# Patient Record
Sex: Male | Born: 2000 | Race: White | Hispanic: No | Marital: Single | Smoking: Current every day smoker
Health system: Southern US, Community
[De-identification: ages and names within clinical notes are randomized; demographics above are authoritative.]

## PROBLEM LIST (undated history)

## (undated) VITALS — BP 110/64 | HR 66 | Temp 97.9°F | Resp 18 | Ht 68.0 in | Wt 125.0 lb

## (undated) VITALS — BP 107/71 | HR 125 | Temp 97.9°F | Resp 18 | Wt 125.0 lb

## (undated) DIAGNOSIS — F19159 Other psychoactive substance abuse with psychoactive substance-induced psychotic disorder, unspecified: Secondary | ICD-10-CM

## (undated) DIAGNOSIS — F333 Major depressive disorder, recurrent, severe with psychotic symptoms: Secondary | ICD-10-CM

## (undated) DIAGNOSIS — F29 Unspecified psychosis not due to a substance or known physiological condition: Secondary | ICD-10-CM

## (undated) DIAGNOSIS — F250 Schizoaffective disorder, bipolar type: Secondary | ICD-10-CM

## (undated) DIAGNOSIS — F419 Anxiety disorder, unspecified: Secondary | ICD-10-CM

## (undated) DIAGNOSIS — T1491XA Suicide attempt, initial encounter: Secondary | ICD-10-CM

## (undated) DIAGNOSIS — R519 Headache, unspecified: Secondary | ICD-10-CM

## (undated) DIAGNOSIS — R51 Headache: Secondary | ICD-10-CM

---
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
## 2019-08-04 ENCOUNTER — Observation Stay (HOSPITAL_COMMUNITY)
Admission: RE | Admit: 2019-08-04 | Discharge: 2019-08-05 | Disposition: A | Discharge status: Home / Self Care | Payer: Medicaid Other | Attending: Psychiatry | Admitting: Psychiatry

## 2019-08-04 DIAGNOSIS — F17200 Nicotine dependence, unspecified, uncomplicated: Secondary | ICD-10-CM | POA: Insufficient documentation

## 2019-08-04 DIAGNOSIS — Z880 Allergy status to penicillin: Secondary | ICD-10-CM | POA: Diagnosis not present

## 2019-08-04 DIAGNOSIS — Z888 Allergy status to other drugs, medicaments and biological substances status: Secondary | ICD-10-CM | POA: Diagnosis not present

## 2019-08-04 DIAGNOSIS — Z20822 Contact with and (suspected) exposure to covid-19: Secondary | ICD-10-CM | POA: Diagnosis not present

## 2019-08-04 DIAGNOSIS — Z915 Personal history of self-harm: Secondary | ICD-10-CM | POA: Insufficient documentation

## 2019-08-04 DIAGNOSIS — Z882 Allergy status to sulfonamides status: Secondary | ICD-10-CM | POA: Insufficient documentation

## 2019-08-04 DIAGNOSIS — F332 Major depressive disorder, recurrent severe without psychotic features: Secondary | ICD-10-CM | POA: Diagnosis not present

## 2019-08-04 DIAGNOSIS — Z881 Allergy status to other antibiotic agents status: Secondary | ICD-10-CM | POA: Insufficient documentation

## 2019-08-04 LAB — RESPIRATORY PANEL BY RT PCR (FLU A&B, COVID)
Influenza A by PCR: NEGATIVE
Influenza B by PCR: NEGATIVE
SARS Coronavirus 2 by RT PCR: NEGATIVE

## 2019-08-04 NOTE — BH Assessment (Addendum)
Assessment Note  Billy Rose is an 19 y.o. male with history of MDD, recurrent, severe, with psychosis, psychoactive substance with psychotic disorder, and psychosis. Patient states that he was brought to [facility] by his mother. He then reported that he drove himself. His mode of transportation to [facility] is unclear. He presented to [facility] as a walk-in. He is voluntary. Patient has several stressors. He reports no sleeping more than 6 hrs in the past 7 days. He also has not eaten well in the past month. He has only eaten a slice of pizza and a box of nerds in the past 7 days. States that he quit his job after one day of working. He was unable to provide a rationale for quitting his job. He is in the process of getting evicted from his residence. Also, recent arguments with his girlfriend.   Patient denies suicidal ideations. No plan or intent. He has a history of self mutilating behaviors (superficial cutting). He last cut himself 3 months. States, "I promised my girlfriend that I wouldn't cut anymore". Patient with symptoms of depression. States that he isolating self from others, experiencing loss of interest in usual pleasures, and crying spells. No HI. No history of aggressive behaviors. Calm and cooperative during the assessment. He reports auditory hallucinations of mumbling sounds. He is experiencing tactile hallucinations of spiders crawling on him. He has visual hallucinations of "light streaks". Patient reports alcohol and thc use only. However is history notes usage of cocaine, LSD, and heroin.   Patient was oriented to person and place. He was minimally oriented to time. He did not know the date stating it was "March 20 something". He also did not know the day of the week. His understanding of the situation was also questionable as patient presented confused. He displayed thought blocking. Speech was delayed at times. He appeared distracted during the assessment. His mood was depressed. Judgement and insight  were both poor.    Diagnosis: MDD, recurrent, severe, with psychosis,  Past Medical History:  Past Medical History:  Diagnosis Date  . Headache   . MDD (major depressive disorder), recurrent, severe, with psychosis (HCC)   . 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 Psychosis (HCC)     No past surgical history on file.  Family History: No family history on file.  Social History:  reports that he has been smoking. He has never used smokeless tobacco. He reports current alcohol use. He reports current drug use. Drugs: Cocaine, LSD, and Marijuana.  Additional Social History:  Alcohol / Drug Use Pain Medications: see MAR Prescriptions: see MAR Over the Counter: see MAR History of alcohol / drug use?: Yes Substance #1 Name of Substance 1: Alcohol 1 - Age of First Use: teens 1 - Amount (size/oz): "2 Mickies" 1 - Frequency: daily 1 - Duration: on-going 1 - Last Use / Amount: unable to determine Substance #2 Name of Substance 2: LSD per history; patient denied substance use outside of thc and alcohol" 2 - Age of First Use: unk 2 - Amount (size/oz): unk 2 - Frequency: unk, UTA 2 - Duration: unk, UTA 2 - Last Use / Amount: unk, UTA Substance #3 Name of Substance 3: Heroin per history; patient denied substance  use outside of thc and alcohol" 3 - Age of First Use: unk, UTA 3 - Amount (size/oz): unk, UTA 3 - Frequency: unk, UTA 3 - Duration: unk, UTA 3 - Last Use / Amount: unk Substance #4 Name of  Substance 4: Cocaine per per history; patient denied substance use outside of thc and alcohol" 4 - Age of First Use: unk, UTA 4 - Amount (size/oz): unk, UTA 4 - Frequency: unk, UTA 4 - Duration: unk, UTA 4 - Last Use / Amount: unk, UTA Substance #5 Name of Substance 5: THC 5 - Age of First Use: teens 5 - Amount (size/oz): 2 grams 5 - Frequency: 2x's per week 5 - Duration: on-going 5 - Last Use / Amount: unk, UTA  CIWA: CIWA-Ar BP: (!)  140/91(Nurse was notified Manufacturing systems engineer) Pulse Rate: (!) 114(Nurse was notified Manufacturing systems engineer) COWS:    Allergies:  Allergies  Allergen Reactions  . Sulfa Antibiotics Anaphylaxis, Shortness Of Breath, Swelling and Rash    Throat swells  . Penicillins Other (See Comments)    Reaction not recalled, but he IS allergic Did it involve swelling of the face/tongue/throat, SOB, or low BP? Unk Did it involve sudden or severe rash/hives, skin peeling, or any reaction on the inside of your mouth or nose? Unk Did you need to seek medical attention at a hospital or doctor's office? Unk When did it last happen? Childhood If all above answers are "NO", may proceed with cephalosporin use.   . Prozac [Fluoxetine Hcl] Other (See Comments)    Makes mean  . Bactrim [Sulfamethoxazole-Trimethoprim] Rash    Home Medications:  Medications Prior to Admission  Medication Sig Dispense Refill  . OLANZapine zydis (ZYPREXA) 10 MG disintegrating tablet Take 1 tablet (10 mg total) by mouth every morning. 30 tablet 0  . OLANZapine zydis (ZYPREXA) 15 MG disintegrating tablet Take 1 tablet (15 mg total) by mouth at bedtime. 30 tablet 0    OB/GYN Status:  No LMP for male patient.  General Assessment Data Location of Assessment: [facility] Assessment Services TTS Assessment: In system Is this a Tele or Face-to-Face Assessment?: Face-to-Face Is this an Initial Assessment or a Re-assessment for this encounter?: Initial Assessment Patient Accompanied by:: (self referral) Language Other than English: No Living Arrangements: Other (Comment)(alone) What gender do you identify as?: Male Marital status: Single Maiden name: (n/a) Pregnancy Status: No Living Arrangements: Alone Can pt return to current living arrangement?: Yes Admission Status: Voluntary Is patient capable of signing voluntary admission?: Yes Referral Source: Self/Family/Friend Insurance type: (Medicaid)  Medical Screening Exam [facility] Walk-in ONLY) Medical  Exam completed: Billy Kilts, NP)  Crisis Care Plan Living Arrangements: Alone Legal Guardian: Other: Name of Psychiatrist: (Dr. Kathryne Sharper; last seen 1 yr ago) Name of Therapist: (No therapist)  Education Status Is patient currently in school?: No Is the patient employed, unemployed or receiving disability?: Unemployed(quit job today)  Risk to self with the past 6 months Suicidal Ideation: No Has patient been a risk to self within the past 6 months prior to admission? : No Suicidal Intent: No Has patient had any suicidal intent within the past 6 months prior to admission? : No Is patient at risk for suicide?: No Suicidal Plan?: No Has patient had any suicidal plan within the past 6 months prior to admission? : No Access to Means: No What has been your use of drugs/alcohol within the last 12 months?: (reports thc and alcohol use; LSD, Heroin, cocaine per hx) Previous Attempts/Gestures: No How many times?: (0) Other Self Harm Risks: (yes; hx of cutting; self mutilating) Triggers for Past Attempts: Other (Comment)(depression) Intentional Self Injurious Behavior: Cutting Comment - Self Injurious Behavior: (hx of cutting) Family Suicide History: No Persecutory voices/beliefs?: No Depression: Yes Depression Symptoms:  Feeling angry/irritable, Feeling worthless/self pity, Loss of interest in usual pleasures, Fatigue, Isolating Substance abuse history and/or treatment for substance abuse?: No Suicide prevention information given to non-admitted patients: Not applicable  Risk to Others within the past 6 months Homicidal Ideation: No Does patient have any lifetime risk of violence toward others beyond the six months prior to admission? : No Thoughts of Harm to Others: No Current Homicidal Intent: No Current Homicidal Plan: No Access to Homicidal Means: No Identified Victim: (n/a) History of harm to others?: No Assessment of Violence: None Noted Violent Behavior  Description: (patient is calm and cooperative ) Does patient have access to weapons?: No Criminal Charges Pending?: No Does patient have a court date: No Is patient on probation?: No  Psychosis Hallucinations: Auditory, Visual, Tactile(Tactile-spider crawling on him; Visual-"invisible line") Delusions: Unspecified(Aditory-"Mumbles")  Mental Status Report Appearance/Hygiene: Disheveled Eye Contact: Good Motor Activity: Freedom of movement Speech: Logical/coherent Level of Consciousness: Alert Mood: Depressed Affect: Appropriate to circumstance Anxiety Level: None Thought Processes: Relevant, Coherent Judgement: Impaired Orientation: Person, Place, Time, Situation Obsessive Compulsive Thoughts/Behaviors: None  Cognitive Functioning Concentration: Decreased Memory: Recent Intact, Remote Intact Is patient IDD: No Insight: Poor Impulse Control: Poor Appetite: Poor(not eating well in 1 month ) Have you had any weight changes? : Loss Amount of the weight change? (lbs): (pt reports not eating in the past 5 days) Sleep: Decreased(6 hrs in the past 7 days) Total Hours of Sleep: (6 hrs in the past 7 days)  ADLScreening [facility] Assessment Services) Patient's cognitive ability adequate to safely complete daily activities?: Yes Patient able to express need for assistance with ADLs?: Yes Independently performs ADLs?: Yes (appropriate for developmental age)  Prior Inpatient Therapy Prior Inpatient Therapy: Yes Prior Therapy Dates: (3 admissions to [facility]) Prior Therapy Facilty/Provider(s): [facility]) Reason for Treatment: (depression)  Prior Outpatient Therapy Prior Outpatient Therapy: Yes Prior Therapy Dates: (past; 1 yr ago saw Dr. Adele Schilder) Prior Therapy Facilty/Provider(s): (Dr. Adele Schilder) Reason for Treatment: (depression) Does patient have an ACCT team?: No Does patient have Intensive In-House Services?  : No Does patient have [facility] services? : No Does patient have P4CC services?:  No  ADL Screening (condition at time of admission) Patient's cognitive ability adequate to safely complete daily activities?: Yes Is the patient deaf or have difficulty hearing?: No Does the patient have difficulty seeing, even when wearing glasses/contacts?: No Does the patient have difficulty concentrating, remembering, or making decisions?: Yes Patient able to express need for assistance with ADLs?: Yes Does the patient have difficulty dressing or bathing?: No Independently performs ADLs?: Yes (appropriate for developmental age) Does the patient have difficulty walking or climbing stairs?: No Weakness of Legs: None Weakness of Arms/Hands: None  Home Assistive Devices/Equipment Home Assistive Devices/Equipment: None  Therapy Consults (therapy consults require a physician order) PT Evaluation Needed: No OT Evalulation Needed: No SLP Evaluation Needed: No Abuse/Neglect Assessment (Assessment to be complete while patient is alone) Physical Abuse: Denies Verbal Abuse: Denies Sexual Abuse: Denies Self-Neglect: Denies Values / Beliefs Cultural Requests During Hospitalization: None Spiritual Requests During Hospitalization: None Consults Spiritual Care Consult Needed: No Transition of Care Team Consult Needed: No Advance Directives (For Healthcare) Does Patient Have a Medical Advance Directive?: No Would patient like information on creating a medical advance directive?: No - Patient declined Nutrition Screen- [facility] Adult/[facility]/AP Patient's home diet: Regular Has the patient recently lost weight without trying?: No Has the patient been eating poorly because of a decreased appetite?: No Malnutrition Screening Tool Score: 0  Disposition: Per Renaye Rakers, NP, overnight observation is the recommendation. Patient admitted to OBS. Disposition Initial Assessment Completed for this Encounter: Wilson Singer, NP recommends overnight observation)  On Site Evaluation by:   Reviewed  with Physician:    Melynda Ripple 08/04/2019 8:57 PM
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
## 2019-08-05 ENCOUNTER — Encounter (HOSPITAL_COMMUNITY): Payer: Self-pay | Admitting: Behavioral Health

## 2019-08-05 ENCOUNTER — Other Ambulatory Visit: Payer: Self-pay

## 2019-08-05 DIAGNOSIS — F332 Major depressive disorder, recurrent severe without psychotic features: Secondary | ICD-10-CM | POA: Diagnosis not present

## 2019-08-05 DIAGNOSIS — F333 Major depressive disorder, recurrent, severe with psychotic symptoms: Secondary | ICD-10-CM

## 2019-08-05 LAB — LIPID PANEL
Cholesterol: 120 mg/dL (ref 0–200)
HDL: 48 mg/dL (ref >40)
LDL Cholesterol: 59 mg/dL (ref 0–99)
Total CHOL/HDL Ratio: 2.5 RATIO
Triglycerides: 64 mg/dL (ref <150)
VLDL: 13 mg/dL (ref 0–40)

## 2019-08-05 LAB — HEPATIC FUNCTION PANEL
ALT: 22 U/L (ref 0–44)
AST: 22 U/L (ref 15–41)
Albumin: 4.9 g/dL (ref 3.5–5.0)
Alkaline Phosphatase: 62 U/L (ref 38–126)
Bilirubin, Direct: 0.2 mg/dL (ref 0.0–0.2)
Indirect Bilirubin: 1.2 mg/dL — ABNORMAL HIGH (ref 0.3–0.9)
Total Bilirubin: 1.4 mg/dL — ABNORMAL HIGH (ref 0.3–1.2)
Total Protein: 7.6 g/dL (ref 6.5–8.1)

## 2019-08-05 LAB — COMPREHENSIVE METABOLIC PANEL
ALT: 21 U/L (ref 0–44)
AST: 21 U/L (ref 15–41)
Albumin: 4.8 g/dL (ref 3.5–5.0)
Alkaline Phosphatase: 60 U/L (ref 38–126)
Anion gap: 9 (ref 5–15)
BUN: 14 mg/dL (ref 6–20)
CO2: 28 mmol/L (ref 22–32)
Calcium: 9.5 mg/dL (ref 8.9–10.3)
Chloride: 104 mmol/L (ref 98–111)
Creatinine, Ser: 0.99 mg/dL (ref 0.61–1.24)
GFR calc Af Amer: >60 mL/min (ref >60)
GFR calc non Af Amer: >60 mL/min (ref >60)
Glucose, Bld: 103 mg/dL — ABNORMAL HIGH (ref 70–99)
Potassium: 4.2 mmol/L (ref 3.5–5.1)
Sodium: 141 mmol/L (ref 135–145)
Total Bilirubin: 1.4 mg/dL — ABNORMAL HIGH (ref 0.3–1.2)
Total Protein: 7.9 g/dL (ref 6.5–8.1)

## 2019-08-05 LAB — ETHANOL: Alcohol, Ethyl (B): <10 mg/dL (ref <10)

## 2019-08-05 LAB — MAGNESIUM: Magnesium: 2.5 mg/dL — ABNORMAL HIGH (ref 1.7–2.4)

## 2019-08-05 LAB — HEMOGLOBIN A1C
Hgb A1c MFr Bld: 4.9 % (ref 4.8–5.6)
Mean Plasma Glucose: 93.93 mg/dL

## 2019-08-05 LAB — TSH: TSH: 0.637 u[IU]/mL (ref 0.350–4.500)

## 2019-08-05 MED ORDER — ALUM & MAG HYDROXIDE-SIMETH 200-200-20 MG/5ML PO SUSP: 30.0000 mL | ORAL | Status: DC | PRN

## 2019-08-05 MED ORDER — OLANZAPINE 15 MG PO TBDP: 15.0000 mg | ORAL_TABLET | Freq: Every day | ORAL | 1 refills | Status: DC

## 2019-08-05 MED ORDER — ACETAMINOPHEN 325 MG PO TABS: 650.0000 mg | ORAL_TABLET | Freq: Four times a day (QID) | ORAL | Status: DC | PRN

## 2019-08-05 MED ORDER — HYDROXYZINE HCL 25 MG PO TABS
25.0000 mg | ORAL_TABLET | Freq: Three times a day (TID) | ORAL | Status: DC | PRN
  Filled 2019-08-05: qty 1

## 2019-08-05 MED ORDER — TRAZODONE HCL 50 MG PO TABS
50.0000 mg | ORAL_TABLET | Freq: Every evening | ORAL | Status: DC | PRN
  Filled 2019-08-05: qty 1

## 2019-08-05 MED ORDER — MAGNESIUM HYDROXIDE 400 MG/5ML PO SUSP: 30.0000 mL | Freq: Every day | ORAL | Status: DC | PRN
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
## 2019-08-05 NOTE — BH Assessment (Signed)
BHH Assessment Progress Note  Per Malvin Johns, MD, this pt does not require psychiatric hospitalization at this time.  Pt is to be discharged from the [facility] Observation Unit with recommendation to follow up with his regular outpatient providers a the [facility] at [place].This has been included in pt's discharge instructions.  Please note that pt does not currently have an appointment scheduled.  Pt's nurse, Erin Fulling, has been notified.  Doylene Canning, MA Triage Specialist [phone]

## 2019-08-05 NOTE — Progress Notes (Deleted)
    Pt is xxxx male  of 19 y.o. , DOB [birth year], MRN  [id number]  presents IVC with a SI without method,  Hx of HTN.    Patient Self Inventory Sheet  reports good appetite, energy normal, and fair sleeping pattern without mediaction. Pt rates  depression 7 out of 10, hopelessness 5 out of 10, and anxiety at 1 out of 10. Pt reports SI sometimes without  plans. Denies  HI or AVH.  Pt reports LBM yesterday, physical problems include  pain at a 3 out of 10.Pain is located in the  stomach, leg, and head. Pt denies using any medications for pain. Vitals signs WNL and being monitored. Pt states most important goals are to work with doctors and social workers to [employer].  Medication given as Rx, no adverse reaction observed, safety maintained with q15 minute checks.

## 2019-08-05 NOTE — Progress Notes (Signed)
Progress Note    Pt is caucasian  male  of 19 y.o. , DOB 23-Feb-2001, MRN  [id number]  Presents Voluntary with a SI without method,  Hx of SI   Patient reports good appetite, energy low, and fair sleeping pattern  Pt denies  SiI, HI, and AVH today,  LBM 08/04/19, no pain reported.   Vitals signs WNL  monitored.  Medication given as Rx, safety maintained with q15 minute checks.   Einar Crow. Melvyn Neth MSN, RN, [facility] [phone]

## 2019-08-05 NOTE — Plan of Care (Signed)
[facility] Observation Crisis Plan  Reason for Crisis Plan:  Crisis Stabilization   Plan of Care:  Referral for Inpatient Hospitalization  Family Support:      Current Living Environment:  Living Arrangements: Alone  Insurance:   Hospital Account    Name Acct ID Class Status Primary Coverage   Billy Rose, Billy Rose [id number] BEHAVIORAL HEALTH OBSERVATION Open BLUE CROSS BLUE SHIELD - [employer] STATE HEALTH PPO        Guarantor Account (for Hospital Account [id number])    Name Relation to Pt Service Area Active? Acct Type   Billy Rose Self [facility] Yes [facility]   Address Phone       [address] [place], [place] [address] [phone])          Coverage Information (for Hospital Account [id number])    F/O Payor/Plan Precert #   BLUE CROSS [facility] PPO    Subscriber Subscriber #   Billy Rose, Billy Rose [id number]   Address Phone   [address] [address] [place], [place] [address] [phone]      Legal Guardian:  Legal Guardian: Other:  Primary Care Provider:  [facility]  Current Outpatient Providers:  [facility]  Psychiatrist:  Name of Psychiatrist: (Dr. Kathryne Sharper; last seen 1 yr ago)  Counselor/Therapist:  Name of Therapist: (No therapist)  Compliant with Medications:  No  Additional Information:   Billy Rose 3/5/20214:23 AM

## 2019-08-05 NOTE — Progress Notes (Signed)
Patient ID: Billy Rose, male   DOB: [birth year], 18 y.o.   MRN: [id number] Pt A&O x 4, presents with psychosis and depression, isolating self from others and crying spells.  Denies SI, HI.  Pt reports he is seeing light streaks and spiders crawling on him.  Pt calm & cooperative.  Monitoring for safety, no distress noted.

## 2019-08-05 NOTE — Discharge Instructions (Signed)
For your behavioral health needs, you are advised to continue treatment with your regular outpatient providers at the [facility] at [facility].  Contact them at your earliest opportunity to schedule an appointment:       [facility]. [address]      [place], [place] [address]      [phone]

## 2019-08-05 NOTE — Progress Notes (Signed)
Discharge:  Pt is a caucasian male  of  19 y.o. , DOB 2001/05/03 , MRN  [id number]   Pt is alert and oriented X 4. Pt given AVS packet to include medication information update and other resource information. Pt given all belongings and signature obtained. Pt discharged to private resident with family member. Transportation provided by family member.    Einar Crow. Melvyn Neth MSN, RN, [facility] [phone]

## 2019-08-24 NOTE — Progress Notes (Addendum)
Patient's mother called 08/24/19 and stated insurance would not cover Zyprexa Zydis 15 mg tablets. I called in prescription on 08/24/19 for Zyprexa 15 mg tablets, #30 no refills to [facility] on [address] of [address]

## 2020-01-12 ENCOUNTER — Encounter: Payer: Self-pay | Admitting: Allergy and Immunology

## 2020-01-12 ENCOUNTER — Other Ambulatory Visit: Payer: Self-pay

## 2020-01-12 ENCOUNTER — Ambulatory Visit (INDEPENDENT_AMBULATORY_CARE_PROVIDER_SITE_OTHER): Payer: Medicaid Other | Admitting: Allergy and Immunology

## 2020-01-12 VITALS — BP 100/64 | HR 82 | Temp 98.2°F | Resp 16 | Ht 68.27 in | Wt 123.0 lb

## 2020-01-12 DIAGNOSIS — L500 Allergic urticaria: Secondary | ICD-10-CM

## 2020-01-12 DIAGNOSIS — Z889 Allergy status to unspecified drugs, medicaments and biological substances status: Secondary | ICD-10-CM | POA: Diagnosis not present

## 2020-01-12 DIAGNOSIS — R220 Localized swelling, mass and lump, head: Secondary | ICD-10-CM | POA: Diagnosis not present

## 2020-01-12 MED ORDER — AMOXICILLIN 250 MG PO CAPS: 250.0000 mg | ORAL_CAPSULE | ORAL | 0 refills | Status: DC | PRN

## 2020-01-12 MED ORDER — SULFAMETHOXAZOLE-TRIMETHOPRIM 800-160 MG PO TABS: 1.0000 | ORAL_TABLET | ORAL | 0 refills | Status: DC | PRN

## 2020-01-12 NOTE — Patient Instructions (Addendum)
History of drug allergy Epicutaneous and intradermal skin tests to Pre-Pen and pen G were negative despite a positive histamine control.  We will proceed to open graded challenge.    A prescription has been provided for amoxicillin 250 mg capsules x4, which the patient is to bring with him on the day of the challenge.  On a later date, we will skin test and, if negative, proceed to oral challenge with sulfonamide antibiotic.   Return for amoxicillin challenge.

## 2020-01-12 NOTE — Progress Notes (Signed)
New Patient Note  RE: Billy Rose MRN: [id number] DOB: 07-Dec-2000 Date of Office Visit: 01/12/2020  Referring provider: [facility] Primary care provider: [facility]  Chief Complaint: Food/Drug Challenge (Penicillin & Sulfate)  History of present illness: Billy Rose is a 19 y.o. male seen today in consultation requested by [facility].  He reports that when he was 19 years old he took a sulfonamide antibiotic and within hours developed angioedema of the lips and eyelids, as well as generalized urticaria.  He did not experience concomitant cardiopulmonary or GI symptoms.  When he was 52 or 19 years old, he was taking penicillin for otitis media and had similar symptoms, including angioedema of the lips and eyelids and generalized urticaria.  He did not experience cardiopulmonary or GI symptoms in association with that episode.  He is here to be tested to these medications as he is applying to [employer].  Assessment and plan: History of drug allergy Epicutaneous and intradermal skin tests to Pre-Pen and pen G were negative despite a positive histamine control.  We will proceed to open graded challenge.    A prescription has been provided for amoxicillin 250 mg capsules x4, which the patient is to bring with him on the day of the challenge.  On a later date, we will skin test and, if negative, proceed to oral challenge with sulfonamide antibiotic.   Meds ordered this encounter  Medications   amoxicillin (AMOXIL) 250 MG capsule    Sig: Take 1 capsule (250 mg total) by mouth as needed.    Dispense:  4 capsule    Refill:  0   sulfamethoxazole-trimethoprim (BACTRIM DS) 800-160 MG tablet    Sig: Take 1 tablet by mouth as needed.    Dispense:  6 tablet    Refill:  0    Diagnostics: PenG skin testing: Negative percutaneous and intradermal tests at all dilutions despite a positive histamine control. PrePen skin testing: Negative  percutaneous and intradermal tests at all dilutions despite a positive histamine control.   Physical examination: Blood pressure 100/64, pulse 82, temperature 98.2 F (36.8 C), temperature source Oral, resp. rate 16, height 5' 8.27" (1.734 m), weight 123 lb (55.8 kg), SpO2 99 %.  General: Alert, interactive, in no acute distress. Neck: Supple without lymphadenopathy. Lungs: Clear to auscultation without wheezing, rhonchi or rales. CV: Normal S1, S2 without murmurs. Abdomen: Nondistended, nontender. Skin: Warm and dry, without lesions or rashes. Extremities:  No clubbing, cyanosis or edema. Neuro:   Grossly intact.  Review of systems:  Review of systems negative except as noted in HPI / PMHx or noted below: Review of Systems  Constitutional: Negative.   HENT: Negative.   Eyes: Negative.   Respiratory: Negative.   Cardiovascular: Negative.   Gastrointestinal: Negative.   Genitourinary: Negative.   Musculoskeletal: Negative.   Skin: Negative.   Neurological: Negative.   Endo/Heme/Allergies: Negative.   Psychiatric/Behavioral: Negative.     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 No pertinent surgical history.  Family history: Family History  Problem Relation Age of Onset   Allergic rhinitis Neg Hx    Asthma Neg Hx    Eczema Neg Hx    Urticaria Neg Hx  Social history: Social History   Socioeconomic History   Marital status: Single    Spouse name: Not on file   Number of children: Not on file   Years of education: Not on file   Highest education level: Not on file  Occupational History   Not on file  Tobacco Use   Smoking status: Current Every Day Smoker   Smokeless tobacco: Never Used  Vaping Use     Vaping Use: Never used  Substance and Sexual Activity   Alcohol use: Yes    Comment: "can't remember my last drink"   Drug use: Yes    Types: Cocaine, LSD, Marijuana    Comment: reports not using pot in 2 weeks and 1 tab LSD 1 mth ago   Sexual activity: Not Currently  Other Topics Concern   Not on file  Social History Narrative   ** Merged History Encounter **       Social Determinants of Health   Financial Resource Strain:    Difficulty of Paying Living Expenses:   Food Insecurity:    Worried About Programme researcher, broadcasting/film/video in the Last Year:    Barista in the Last Year:   Transportation Needs:    Freight forwarder (Medical):    Lack of Transportation (Non-Medical):   Physical Activity:    Days of Exercise per Week:    Minutes of Exercise per Session:   Stress:    Feeling of Stress :   Social Connections:    Frequency of Communication with Friends and Family:    Frequency of Social Gatherings with Friends and Family:    Attends Religious Services:    Active Member of Clubs or Organizations:    Attends Engineer, structural:    Marital Status:   Intimate Partner Violence:    Fear of Current or Ex-Partner:    Emotionally Abused:    Physically Abused:    Sexually Abused:     Environmental History: The patient lives in a 20 year old house with hardwood floors throughout and central air/heat.  There is no known mold/water damage in the home.  There are no pets in the home.  He smokes 1 pack of cigarettes per day and is uncertain when he started smoking.   Current Outpatient Medications  Medication Sig Dispense Refill   amoxicillin (AMOXIL) 250 MG capsule Take 1 capsule (250 mg total) by mouth as needed. 4 capsule 0   sulfamethoxazole-trimethoprim (BACTRIM DS) 800-160 MG tablet Take 1 tablet by mouth as needed. 6 tablet 0   No current facility-administered medications for this visit.    Known medication allergies: Allergies   Allergen Reactions   Penicillins Other (See Comments), Anaphylaxis and Swelling    Reaction not recalled, but he IS allergic Did it involve swelling of the face/tongue/throat, SOB, or low BP? Unk Did it involve sudden or severe rash/hives, skin peeling, or any reaction on the inside of your mouth or nose? Unk Did you need to seek medical attention at a hospital or doctor's office? Unk When did it last happen? Childhood If all above answers are NO, may proceed with cephalosporin use.  Reaction not recalled, but he IS allergic Did it involve swelling of the face/tongue/throat, SOB, or low BP? Unk Did it involve sudden or severe rash/hives, skin peeling, or any reaction on the inside of your mouth or nose? Unk Did you need to seek medical attention at a hospital or doctor's office? Unk When did it last  happen? Childhood If all above answers are NO, may proceed with cephalosporin use.   Sulfa Antibiotics Anaphylaxis, Shortness Of Breath, Swelling and Rash    Throat swells   Prozac [Fluoxetine Hcl] Other (See Comments)    Makes mean   Bactrim [Sulfamethoxazole-Trimethoprim] Rash    I appreciate the opportunity to take part in Demitrios's care. Please do not hesitate to contact me with questions.  Sincerely,   R. Jorene Guest, MD

## 2020-01-12 NOTE — Assessment & Plan Note (Addendum)
Epicutaneous and intradermal skin tests to Pre-Pen and pen G were negative despite a positive histamine control.  We will proceed to open graded challenge.    A prescription has been provided for amoxicillin 250 mg capsules x4, which the patient is to bring with him on the day of the challenge.  On a later date, we will skin test and, if negative, proceed to oral challenge with sulfonamide antibiotic.

## 2020-01-18 ENCOUNTER — Ambulatory Visit: Payer: Self-pay | Admitting: Allergy and Immunology

## 2020-01-24 ENCOUNTER — Encounter: Payer: Medicaid Other | Admitting: Family Medicine

## 2020-01-24 NOTE — Progress Notes (Deleted)
° °  [address] [place] [address] Dept: [phone]  FOLLOW UP NOTE  Patient ID: Billy Rose, male    DOB: April 27, 2001  Age: 19 y.o. MRN: [id number] Date of Office Visit: 01/24/2020  Assessment  Chief Complaint: No chief complaint on file.  HPI [name]    Drug Allergies:  Allergies  Allergen Reactions   Penicillins Other (See Comments), Anaphylaxis and Swelling    Reaction not recalled, but he IS allergic Did it involve swelling of the face/tongue/throat, SOB, or low BP? Unk Did it involve sudden or severe rash/hives, skin peeling, or any reaction on the inside of your mouth or nose? Unk Did you need to seek medical attention at a hospital or doctor's office? Unk When did it last happen? Childhood If all above answers are NO, may proceed with cephalosporin use.  Reaction not recalled, but he IS allergic Did it involve swelling of the face/tongue/throat, SOB, or low BP? Unk Did it involve sudden or severe rash/hives, skin peeling, or any reaction on the inside of your mouth or nose? Unk Did you need to seek medical attention at a hospital or doctor's office? Unk When did it last happen? Childhood If all above answers are NO, may proceed with cephalosporin use.   Sulfa Antibiotics Anaphylaxis, Shortness Of Breath, Swelling and Rash    Throat swells   Prozac [Fluoxetine Hcl] Other (See Comments)    Makes mean   Bactrim [Sulfamethoxazole-Trimethoprim] Rash    Physical Exam: There were no vitals taken for this visit.   Physical Exam  Diagnostics:    Assessment and Plan: No diagnosis found.  No orders of the defined types were placed in this encounter.   There are no Patient Instructions on file for this visit.  No follow-ups on file.    Thank you for the opportunity to care for this patient.  Please do not hesitate to contact me with questions.  Thermon Leyland, FNP [facility]

## 2020-02-07 ENCOUNTER — Encounter: Payer: Self-pay | Admitting: Family

## 2020-02-07 ENCOUNTER — Other Ambulatory Visit: Payer: Self-pay

## 2020-02-07 ENCOUNTER — Ambulatory Visit (INDEPENDENT_AMBULATORY_CARE_PROVIDER_SITE_OTHER): Payer: Medicaid Other | Admitting: Family

## 2020-02-07 VITALS — BP 112/80 | HR 88 | Temp 98.1°F | Resp 18 | Ht 68.0 in | Wt 123.0 lb

## 2020-02-07 DIAGNOSIS — R220 Localized swelling, mass and lump, head: Secondary | ICD-10-CM

## 2020-02-07 DIAGNOSIS — L500 Allergic urticaria: Secondary | ICD-10-CM | POA: Diagnosis not present

## 2020-02-07 DIAGNOSIS — Z889 Allergy status to unspecified drugs, medicaments and biological substances status: Secondary | ICD-10-CM | POA: Diagnosis not present

## 2020-02-07 NOTE — Patient Instructions (Addendum)
History of drug allergy  Due to your symptom today we advise that you continue to avoid penicillin drugs and sulfonamide drugs. Instructed the patient that with today's reaction to amoxicillin that there was a greater chance of a reaction to other antibiotics including sulfonamides.  Please let us know if this treatment plan is not working well for you. Since your recruiter would like for you to be scheduled for an oral challenge to sulfonamide, schedule follow-up appointment in 2 weeks. Stay off all antihistamines 3 days prior to your appointment.

## 2020-02-07 NOTE — Progress Notes (Addendum)
[address] [place] [address] Dept: [phone]  FOLLOW UP NOTE  Patient ID: Billy Rose, male    DOB: Jul 03, 2000  Age: 19 y.o. MRN: [id number] Date of Office Visit: 02/07/2020  Assessment  Chief Complaint: Food/Drug Challenge  HPI Billy Rose is a 19 year old male who presents today for drug challenge to amoxicillin as he is applying for the [employer].  He was last seen on January 12, 2020 by Dr. Nunzio Cobbs for history of drug allergy.  He reports when he was around 67 or 19 years old he was taking penicillin for otitis media and began to have generalized urticaria and angioedema of the lips and eyelids.  There were no concomitant cardiopulmonary or GI symptoms.  On January 12, 2020 he had  percutaneous and intradermal skin testing to Pre-Pen and Pen G that were negative despite a positive histamine control.  Today he reports he is in good health and denies any cardiorespiratory, gastrointestinal and cutaneous symptoms.  He has been off all antihistamines for the past 3 days.  Drug Allergies:  Allergies  Allergen Reactions  . Penicillins Other (See Comments), Anaphylaxis and Swelling    Reaction not recalled, but he IS allergic Did it involve swelling of the face/tongue/throat, SOB, or low BP? Unk Did it involve sudden or severe rash/hives, skin peeling, or any reaction on the inside of your mouth or nose? Unk Did you need to seek medical attention at a hospital or doctor's office? Unk When did it last happen? Childhood If all above answers are "NO", may proceed with cephalosporin use.  Reaction not recalled, but he IS allergic Did it involve swelling of the face/tongue/throat, SOB, or low BP? Unk Did it involve sudden or severe rash/hives, skin peeling, or any reaction on the inside of your mouth or nose? Unk Did you need to seek medical attention at a hospital or doctor's office? Unk When did it last happen? Childhood If all above answers are "NO", may proceed  with cephalosporin use.  . Sulfa Antibiotics Anaphylaxis, Shortness Of Breath, Swelling and Rash    Throat swells  . Prozac [Fluoxetine Hcl] Other (See Comments)    Makes mean  . Bactrim [Sulfamethoxazole-Trimethoprim] Rash    Review of Systems: Review of Systems  Constitutional: Negative for chills and fever.  HENT: Negative for congestion.        Also denies rhinorrhea and post nasal drip  Eyes:       Denies itchy watery eyes  Respiratory: Negative for cough, shortness of breath and wheezing.   Cardiovascular: Negative for chest pain and palpitations.  Gastrointestinal: Negative for abdominal pain, nausea and vomiting.  Genitourinary: Negative for dysuria.  Skin: Negative for itching and rash.  Neurological: Negative for headaches.    Physical Exam: BP 112/80   Pulse 88   Temp 98.1 F (36.7 C) (Oral)   Resp 18   Ht 5\' 8"  (1.727 m)   Wt 123 lb (55.8 kg)   SpO2 96%   BMI 18.70 kg/m    Physical Exam Constitutional:      Appearance: Normal appearance.  HENT:     Head: Normocephalic and atraumatic.     Comments: Pharynx normal. Eyes normal. Ears: left ear normal. Right ear: unable to visualize tympanic membrane due to cerumen. Nose normal.    Right Ear: Ear canal and external ear normal.     Left Ear: Tympanic membrane, ear canal and external ear normal.     Nose: Nose normal.  Mouth/Throat:     Mouth: Mucous membranes are moist.     Pharynx: Oropharynx is clear.  Eyes:     Conjunctiva/sclera: Conjunctivae normal.  Cardiovascular:     Rate and Rhythm: Normal rate and regular rhythm.     Heart sounds: Normal heart sounds.  Pulmonary:     Effort: Pulmonary effort is normal.     Breath sounds: Normal breath sounds.     Comments: Lungs clear to auscultation Musculoskeletal:     Cervical back: Neck supple.  Skin:    General: Skin is warm.     Comments: No rash or urticarial lesions noted  Neurological:     Mental Status: He is alert and oriented to person,  place, and time.  Psychiatric:        Mood and Affect: Mood normal.        Behavior: Behavior normal.        Thought Content: Thought content normal.        Judgment: Judgment normal.     Diagnostics:  At 1:54 PM he was given 1 amoxicillin 250 mg capsule.  After waiting 30 minutes the patient reports that for the past few minutes his face had been feeling hot. No facial flushing noted on exam.  He denies any concomitant rashes/hives, cardiorespiratory or gastrointestinal symptoms.  He reports that his face usually does not feel warm and that he normally does not have this sensation.  At 2:31 PM he reports that his face felt okay.  Vital signs and his physical exam were stable throughout.  The challenge was discontinued due to this sensation. At discharge , 3:53 PM, his vital signs and physical exam were stable.   Oral Challenge - 02/07/20 1600    Challenge Food/Drug Amoxicillin 250 mg capsules    Food/Drug provided by patient    BP 112/80    Pulse 88    Respirations 18    Lungs clear    Skin clear    Time 0154    Dose 1 capsule (250 mg)    BP 112/80    Pulse 88    Respirations 18    Comments pt states his face feels warm    Additional Dose Yes    Time 0228    Dose no dose given    BP 110/76    Pulse 72    Respirations 16    Additional Dose Yes    Time 0231    Dose no dose given    Comments reports his face feels okay    Additional Dose Yes    Time 0305    Dose no dose given    BP 114/82    Pulse 79    Respirations 20    Additional Dose Yes    Time 0313    Dose no dose given    BP 116/80    Pulse 74    Respirations 20    Additional Dose Yes    Time 0332    Dose no dose given    BP 108/78    Pulse 68    Respirations 20    Additional Dose Yes    Time 0353    Dose dose not given    BP 106/70    Pulse 66    Respirations 20    Lungs clear    Skin clear    Additional Dose No           Assessment and Plan: 1. History of drug  allergy     No orders of the  defined types were placed in this encounter.   Patient Instructions  History of drug allergy  Due to your symptom today we advise that you continue to avoid penicillin drugs and sulfonamide drugs. Instructed the patient that with today's reaction to amoxicillin that there was a greater chance of a reaction to other antibiotics including sulfonamides.  Please let us know if this treatment plan is not working well for you. Since your recruiter would like for you to be scheduled for an oral challenge to sulfonamide, schedule follow-up appointment in 2 weeks. Stay off all antihistamines 3 days prior to your appointment.   Return in about 2 weeks (around 02/21/2020), or if symptoms worsen or fail to improve.    Thank you for the opportunity to care for this patient.  Please do not hesitate to contact me with questions.  Nehemiah Settle, FNP [facility]  ________________________________________________  I have provided oversight concerning Wynona Canes Virtie Bungert's evaluation and treatment of this patient's health issues addressed during today's encounter.  I agree with the assessment and therapeutic plan as outlined in the note.   Signed,   R Jorene Guest, MD

## 2020-02-20 ENCOUNTER — Telehealth: Payer: Self-pay | Admitting: Allergy and Immunology

## 2020-02-20 NOTE — Telephone Encounter (Signed)
Please advise 

## 2020-02-20 NOTE — Telephone Encounter (Signed)
PT called to see if he left the penicillin at our office from when he did the drug challenge on 9/7. Per Elon Jester, the medication was most likely disposed of. PT has a sulfate challenge on 9/21, please confirm he does not need the penicillin.

## 2020-02-20 NOTE — Telephone Encounter (Signed)
Pt's mom wants confirmation on whether or not he needs prescriptions for challenge tomorrow.

## 2020-02-21 ENCOUNTER — Encounter: Payer: Medicaid Other | Admitting: Family Medicine

## 2020-02-22 ENCOUNTER — Encounter: Payer: Self-pay | Admitting: Family Medicine

## 2020-02-22 NOTE — Telephone Encounter (Signed)
Patient and patient's mother Billy Rose [phone]) on the phone yesterday asking for a prescription for bactrim for the drug testing. Upon review of the chart noting failure of penicillin testing, it was advised that we not move forward with the sulfa drug oral challenge. I then received a telephone call from staff sgt Margo Common (Staff Sgt Margo Common [place]) [phone]) who requested a letter stating our evaluation of the patient's history, failure of penicillin challenge and recommendation for future testing. The letter was drafted and currently awaits copying into the chart. We will mail the letter to the patient as requested by the patient, his mother, and staff sgt Margo Common. They will call the clinic with any further questions or if further documentation is needed.

## 2020-05-21 ENCOUNTER — Inpatient Hospital Stay (HOSPITAL_COMMUNITY)
Admission: RE | Admit: 2020-05-21 | Discharge: 2020-05-26 | DRG: 885 | Disposition: A | Discharge status: Home / Self Care | Payer: Medicaid Other | Attending: Psychiatry | Admitting: Psychiatry

## 2020-05-21 ENCOUNTER — Encounter (HOSPITAL_COMMUNITY): Payer: Self-pay | Admitting: Psychiatric/Mental Health

## 2020-05-21 ENCOUNTER — Other Ambulatory Visit: Payer: Self-pay

## 2020-05-21 DIAGNOSIS — Z888 Allergy status to other drugs, medicaments and biological substances status: Secondary | ICD-10-CM

## 2020-05-21 DIAGNOSIS — Z880 Allergy status to penicillin: Secondary | ICD-10-CM

## 2020-05-21 DIAGNOSIS — Z20822 Contact with and (suspected) exposure to covid-19: Secondary | ICD-10-CM | POA: Diagnosis present

## 2020-05-21 DIAGNOSIS — F1611 Hallucinogen abuse, in remission: Secondary | ICD-10-CM | POA: Diagnosis present

## 2020-05-21 DIAGNOSIS — Z87892 Personal history of anaphylaxis: Secondary | ICD-10-CM | POA: Diagnosis not present

## 2020-05-21 DIAGNOSIS — Z882 Allergy status to sulfonamides status: Secondary | ICD-10-CM

## 2020-05-21 DIAGNOSIS — F1211 Cannabis abuse, in remission: Secondary | ICD-10-CM | POA: Diagnosis present

## 2020-05-21 DIAGNOSIS — F29 Unspecified psychosis not due to a substance or known physiological condition: Secondary | ICD-10-CM | POA: Diagnosis present

## 2020-05-21 DIAGNOSIS — F333 Major depressive disorder, recurrent, severe with psychotic symptoms: Secondary | ICD-10-CM | POA: Diagnosis present

## 2020-05-21 LAB — CBC WITH DIFFERENTIAL/PLATELET
Abs Immature Granulocytes: 0.01 10*3/uL (ref 0.00–0.07)
Basophils Absolute: 0 10*3/uL (ref 0.0–0.1)
Basophils Relative: 1 %
Eosinophils Absolute: 0.4 10*3/uL (ref 0.0–0.5)
Eosinophils Relative: 5 %
HCT: 45.1 % (ref 39.0–52.0)
Hemoglobin: 15.6 g/dL (ref 13.0–17.0)
Immature Granulocytes: 0 %
Lymphocytes Relative: 31 %
Lymphs Abs: 2.4 10*3/uL (ref 0.7–4.0)
MCH: 30.6 pg (ref 26.0–34.0)
MCHC: 34.6 g/dL (ref 30.0–36.0)
MCV: 88.6 fL (ref 80.0–100.0)
Monocytes Absolute: 0.6 10*3/uL (ref 0.1–1.0)
Monocytes Relative: 8 %
Neutro Abs: 4.2 10*3/uL (ref 1.7–7.7)
Neutrophils Relative %: 55 %
Platelets: 300 10*3/uL (ref 150–400)
RBC: 5.09 MIL/uL (ref 4.22–5.81)
RDW: 11.9 % (ref 11.5–15.5)
WBC: 7.6 10*3/uL (ref 4.0–10.5)
nRBC: 0 % (ref 0.0–0.2)

## 2020-05-21 LAB — RESP PANEL BY RT-PCR (FLU A&B, COVID) ARPGX2
Influenza A by PCR: NEGATIVE
Influenza B by PCR: NEGATIVE
SARS Coronavirus 2 by RT PCR: NEGATIVE

## 2020-05-21 MED ORDER — ALUM & MAG HYDROXIDE-SIMETH 200-200-20 MG/5ML PO SUSP
30.0000 mL | ORAL | Status: DC | PRN
  Filled 2020-05-21: qty 30

## 2020-05-21 MED ORDER — ACETAMINOPHEN 325 MG PO TABS
650.0000 mg | ORAL_TABLET | Freq: Four times a day (QID) | ORAL | Status: DC | PRN
  Filled 2020-05-21: qty 2

## 2020-05-21 MED ORDER — MAGNESIUM HYDROXIDE 400 MG/5ML PO SUSP
30.0000 mL | Freq: Every day | ORAL | Status: DC | PRN
  Filled 2020-05-21: qty 30

## 2020-05-21 MED ORDER — OLANZAPINE 5 MG PO TBDP
5.0000 mg | ORAL_TABLET | Freq: Three times a day (TID) | ORAL | Status: DC | PRN
  Filled 2020-05-21: qty 1

## 2020-05-21 MED ORDER — HYDROXYZINE HCL 25 MG PO TABS
25.0000 mg | ORAL_TABLET | Freq: Three times a day (TID) | ORAL | Status: DC | PRN
  Administered 2020-05-24: 25 mg via ORAL
  Filled 2020-05-21 (×2): qty 1

## 2020-05-21 MED ORDER — OLANZAPINE 10 MG PO TBDP
10.0000 mg | ORAL_TABLET | Freq: Every day | ORAL | Status: DC
  Administered 2020-05-21 – 2020-05-22 (×2): 10 mg via ORAL
  Filled 2020-05-21 (×5): qty 1

## 2020-05-21 MED ORDER — ZIPRASIDONE MESYLATE 20 MG IM SOLR
20.0000 mg | INTRAMUSCULAR | Status: DC | PRN
  Filled 2020-05-21: qty 20

## 2020-05-21 MED ORDER — TRAZODONE HCL 50 MG PO TABS
50.0000 mg | ORAL_TABLET | Freq: Every evening | ORAL | Status: DC | PRN
  Administered 2020-05-21 – 2020-05-24 (×4): 50 mg via ORAL
  Filled 2020-05-21 (×4): qty 1

## 2020-05-21 NOTE — Tx Team (Signed)
Initial Treatment Plan 05/21/2020 5:14 PM [facility] INO:[id number]    PATIENT STRESSORS: Financial difficulties Health problems Medication change or noncompliance Occupational concerns   PATIENT STRENGTHS: Physical Health Supportive family/friends   PATIENT IDENTIFIED PROBLEMS: Auditory hallucinations  depression  homelessness  Financial strain               DISCHARGE CRITERIA:  Ability to meet basic life and health needs Improved stabilization in mood, thinking, and/or behavior Motivation to continue treatment in a less acute level of care Need for constant or close observation no longer present  PRELIMINARY DISCHARGE PLAN: Attend aftercare/continuing care group Outpatient therapy Return to previous living arrangement Return to previous work or school arrangements  PATIENT/FAMILY INVOLVEMENT: This treatment plan has been presented to and reviewed with the patient, [name].  The patient and family have been given the opportunity to ask questions and make suggestions.  Raylene Miyamoto, RN 05/21/2020, 5:14 PM

## 2020-05-21 NOTE — H&P (Signed)
Behavioral Health Medical Screening Exam  Billy Rose is an 19 y.o. male. He presents with bizarre, inappropriate affect and is a poor historian. He states he came to the hospital because "My friends and family say I am not right in the head" but unable to explain further. He states he has heard voices for a "long time" but unable to say what the voices are saying. He denies drug use. Denies SI. He reports HI with no plan/intent or target. He has multiple superficial cuts on his left arm and when asked about this, admits to cutting himself but unable to say when he did this or explain intent.  He provides consent for collateral information from his mother Jaxsyn Azam [phone]. Ms. Rueth states the patient has been acting bizarre over the last two weeks. He has been pointing at things that are not there and responding to internal stimuli. She reports he has remote history of LSD use as well as marijuana use but does not believe he has used any substances in the last six months. 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 He agreed to come to [facility] after a conversation with his mother. She denies him showing any aggressive or suicidal behaviors in the last six months. He is agreeable to inpatient hospitalization.  Total Time spent with patient: 20 minutes  Psychiatric Specialty Exam: Physical Exam Vitals reviewed.  Constitutional:      Appearance: He is well-developed and well-nourished.  Cardiovascular:     Rate and Rhythm: Normal rate.  Pulmonary:     Effort: Pulmonary effort is normal.  Neurological:     Mental Status: He is alert and oriented to person, place, and time.    Review of Systems  Constitutional: Negative.   Respiratory: Negative for cough and shortness of breath.   Gastrointestinal: Negative for diarrhea, nausea and vomiting.  Neurological: Negative for dizziness, tremors and headaches.   Blood pressure  137/87, pulse 74, temperature (!) 97.1 F (36.2 C), temperature source Oral, resp. rate 18, SpO2 99 %.There is no height or weight on file to calculate BMI. General Appearance: Bizarre Eye Contact:  Good Speech:  Slow Volume:  Normal Mood:  Euthymic Affect:  Non-Congruent and Labile Thought Process:  Coherent Orientation:  Full (Time, Place, and Person) Thought Content:  Hallucinations: Auditory Suicidal Thoughts:  No Homicidal Thoughts:  Yes.  without intent/plan Memory:  Immediate;   Fair Recent;   Fair Remote;   Fair Judgement:  Poor Insight:  Lacking Psychomotor Activity:  Normal Concentration: Concentration: Poor and Attention Span: Poor Recall:  [employer] of Knowledge:Fair Language: Fair Akathisia:  No Handed:  Right AIMS (if indicated):    Assets:  Housing Resilience Social Support Sleep:     Musculoskeletal: Strength & Muscle Tone: within normal limits Gait & Station: normal Patient leans: N/A  Blood pressure 137/87, pulse 74, temperature (!) 97.1 F (36.2 C), temperature source Oral, resp. rate 18, SpO2 99 %.  Recommendations: Based on my evaluation the patient does not appear to have an emergency medical condition.  Inpatient hospitalization.  Aldean Baker, NP 05/21/2020, 3:00 PM

## 2020-05-21 NOTE — Progress Notes (Signed)
Patient ID: [facility] Rose, male   DOB: May 24, 2001, 19 y.o.   MRN: [id number] Admission Note  Pt is a 19 yo male that presents voluntarily to [facility]. Pt states they are hearing voices and depressed. Upon further questioning, pt states they woke up this morning and told their mother they couldn't make rent. Pt states they have a job but they aren't pleased with it. Pt states their goal for the hospitalization is to be discharged. Pt states they won't be homeless when they leave, but they are unsure who will pay their rent. Pt denies si/hi and verbally agrees to approach staff before harming self/others while at [facility]. Pt denies alcohol/drug/tobacco/Rx abuse/use. Pt denies any physical complaints or pain. Consents signed, handbook detailing the patient's rights, responsibilities, and visitor guidelines provided. Skin/belongings search completed and patient oriented to unit. Patient stable at this time. Patient given the opportunity to express concerns and ask questions. Patient given toiletries. Will continue to monitor.

## 2020-05-21 NOTE — Progress Notes (Signed)
Pt visible on the unit some, with bizarre behavior at times      05/21/20 2300  Psych Admission Type (Psych Patients Only)  Admission Status Voluntary  Psychosocial Assessment  Patient Complaints Anxiety  Eye Contact Intense  Facial Expression Anxious;Pensive  Affect Anxious;Irritable;Preoccupied  Speech Logical/coherent  Interaction Avoidant;Cautious;Forwards little;Guarded;Isolative;Poor  Motor Activity Fidgety  Appearance/Hygiene Disheveled;Poor hygiene  Behavior Characteristics Appropriate to situation  Mood Anxious  Thought Process  Coherency Blocking  Content Ambivalence;Blaming others  Delusions Paranoid  Perception Hallucinations  Hallucination Auditory  Judgment Limited  Confusion Mild  Danger to Self  Current suicidal ideation? Denies  Danger to Others  Danger to Others None reported or observed
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
## 2020-05-21 NOTE — Progress Notes (Signed)
Adult Psychoeducational Group Note  Date:  05/21/2020 Time:  9:38 PM  Group Topic/Focus:  Wrap-Up Group:   The focus of this group is to help patients review their daily goal of treatment and discuss progress on daily workbooks.  Participation Level:  Did Not Attend  Participation Quality:  Did Not Attend  Affect:  Did Not Attend  Cognitive:  Did Not Attend  Insight: None  Engagement in Group:  Did Not Attend  Modes of Intervention:  Did Not Attend  Additional Comments:  Pt did not attend evening wrap up group tonight.  Felipa Furnace 05/21/2020, 9:38 PM

## 2020-05-22 DIAGNOSIS — F29 Unspecified psychosis not due to a substance or known physiological condition: Secondary | ICD-10-CM

## 2020-05-22 LAB — COMPREHENSIVE METABOLIC PANEL
ALT: 30 U/L (ref 0–44)
AST: 23 U/L (ref 15–41)
Albumin: 4.6 g/dL (ref 3.5–5.0)
Alkaline Phosphatase: 63 U/L (ref 38–126)
Anion gap: 12 (ref 5–15)
BUN: 19 mg/dL (ref 6–20)
CO2: 27 mmol/L (ref 22–32)
Calcium: 9.4 mg/dL (ref 8.9–10.3)
Chloride: 102 mmol/L (ref 98–111)
Creatinine, Ser: 0.88 mg/dL (ref 0.61–1.24)
GFR, Estimated: >60 mL/min (ref >60)
Glucose, Bld: 112 mg/dL — ABNORMAL HIGH (ref 70–99)
Potassium: 3.9 mmol/L (ref 3.5–5.1)
Sodium: 141 mmol/L (ref 135–145)
Total Bilirubin: 0.3 mg/dL (ref 0.3–1.2)
Total Protein: 7.5 g/dL (ref 6.5–8.1)

## 2020-05-22 LAB — LIPID PANEL
Cholesterol: 143 mg/dL (ref 0–200)
HDL: 68 mg/dL (ref >40)
LDL Cholesterol: 42 mg/dL (ref 0–99)
Total CHOL/HDL Ratio: 2.1 RATIO
Triglycerides: 164 mg/dL — ABNORMAL HIGH (ref <150)
VLDL: 33 mg/dL (ref 0–40)

## 2020-05-22 LAB — URINALYSIS, ROUTINE W REFLEX MICROSCOPIC
Bilirubin Urine: NEGATIVE
Glucose, UA: NEGATIVE mg/dL
Hgb urine dipstick: NEGATIVE
Ketones, ur: NEGATIVE mg/dL
Leukocytes,Ua: NEGATIVE
Nitrite: NEGATIVE
Protein, ur: NEGATIVE mg/dL
Specific Gravity, Urine: 1.028 (ref 1.005–1.030)
pH: 6 (ref 5.0–8.0)

## 2020-05-22 LAB — RAPID URINE DRUG SCREEN, HOSP PERFORMED
Amphetamines: NOT DETECTED
Barbiturates: NOT DETECTED
Benzodiazepines: NOT DETECTED
Cocaine: NOT DETECTED
Opiates: NOT DETECTED
Tetrahydrocannabinol: NOT DETECTED

## 2020-05-22 LAB — TSH: TSH: 0.644 u[IU]/mL (ref 0.350–4.500)

## 2020-05-22 LAB — HEMOGLOBIN A1C
Hgb A1c MFr Bld: 4.9 % (ref 4.8–5.6)
Mean Plasma Glucose: 93.93 mg/dL

## 2020-05-22 LAB — ETHANOL: Alcohol, Ethyl (B): <10 mg/dL (ref <10)

## 2020-05-22 NOTE — Progress Notes (Signed)
Adult Psychoeducational Group Note  Date:  05/22/2020 Time:  11:06 PM  Group Topic/Focus:  Wrap-Up Group:   The focus of this group is to help patients review their daily goal of treatment and discuss progress on daily workbooks.  Participation Level:  Minimal  Participation Quality:  Appropriate  Affect:  Appropriate  Cognitive:  Appropriate  Insight: Limited  Engagement in Group:  Limited  Modes of Intervention:  Discussion  Additional Comments:  Pt stated his goal for today was to focus on his treatment plan. Pt stated he accomplished his goal today. Pt stated he was able to talk with his doctor and social worker about his care today. Pt rated his overall day a 10. Pt stated his relationship with his family and support system needs to be improved. Pt state he was able to contact his mother today, which improved his day. Pt stated he was able to attend all meals. Pt stated he took all medications provided today. Pt stated he felt better about himself today. Pt stated his appetite was pretty good  today. Pt rated sleep last night was pretty good. Pt stated he was no physical pain today. Pt deny auditory or visual hallucinations. Pt denies thoughts of harming himself or others. Pt stated he would alert staff if anything changes.  Felipa Furnace 05/22/2020, 11:06 PM

## 2020-05-22 NOTE — [facility] Suicide Risk Assessment (Signed)
[facility] INPATIENT:  Family/Significant Other Suicide Prevention Education  Refusal of Consents:  Suicide Prevention Education:  Patient Refusal for Family/Significant Other Suicide Prevention Education: The patient [facility] has refused to provide written consent for family/significant other to be provided Family/Significant Other Suicide Prevention Education during admission and/or prior to discharge.  Physician notified.   SPE completed with patient, as patient refused to consent to family contact. SPI pamphlet provided to pt and pt was encouraged to share information with support network, ask questions, and talk about any concerns relating to SPE. Patient denies access to guns/firearms and verbalized understanding of information provided. Mobile Crisis information also provided to patient.   Ruthann Cancer MSW, LCSW Clincal Social Worker  [facility]

## 2020-05-22 NOTE — [facility] Suicide Risk Assessment (Addendum)
[facility] Admission Suicide Risk Assessment   Nursing information obtained from:  Patient  Demographic factors:  Caucasian male, young adult Current Mental Status:historical report of response to internal stimuli with AH and depressed mood Loss Factors: unable to obtain due to lack of patient cooperation Historical Factors:  Impulsivity by history Risk Reduction Factors:  Employed,Positive social support,Positive therapeutic relationship  Total Time Spent in Direct Patient Care:  I personally spent 20 minutes on the unit in direct patient care. The direct patient care time included face-to-face time with the patient, reviewing the patient's chart, communicating with other professionals, and coordinating care.   Principal Problem: unspecified schizophrenia spectrum and other psychotic d/o (r/o substance induced psychotic d/o, r/o schizophreniform d/o, r/o schizoaffective d/o, r/o MDD severe with psychotic features)  Subjective Data: The patient refused to answer any questions on my assessment and refused to participate in an interview. Per his notes, the patient is a 19y/o male admitted for increasingly bizarre behaviors at home over the last 2 weeks as well as psychotic symptoms. Per family he has a remote h/o hallucinogen use and THC use but has been clean for the last 6 months.   Continued Clinical Symptoms:  Alcohol Use Disorder Identification Test Final Score (AUDIT): 0 The "Alcohol Use Disorders Identification Test", Guidelines for Use in Primary Care, Second Edition.  World Science writer [facility]). Score between 0-7:  no or low risk or alcohol related problems. Score between 8-15:  moderate risk of alcohol related problems. Score between 16-19:  high risk of alcohol related problems. Score 20 or above:  warrants further diagnostic evaluation for alcohol dependence and treatment.  CLINICAL FACTORS:   Previous Psychiatric Diagnoses and Treatments Depressed mood and recent psychotic  symptoms prior to admission  Musculoskeletal: Strength & Muscle Tone: Patient uncooperative for assessment Gait & Station: Patient uncooperative for assessment Patient leans: N/A  Psychiatric Specialty Exam: Physical Exam HENT:     Head: Normocephalic.  Pulmonary:     Effort: Pulmonary effort is normal.  Neurological:     Mental Status: He is alert.     Review of Systems: would not cooperate for questioning  Blood pressure 137/87, pulse 74, temperature (!) 97.1 F (36.2 C), temperature source Oral, resp. rate 18, height 5\' 9"  (1.753 m), weight 55 kg, SpO2 99 %.Body mass index is 17.91 kg/m.  General Appearance: casually dressed, resting in bed and awakens to his name; poor engagement in interview  Eye Contact:  Poor  Speech:  Nonverbal - will not answer questions  Volume:  Unable to assess  Mood:  Unable to assess  Affect:  Sleepy, disinterested  Thought Process:  Unable to assess  Orientation:  Unable to assess  Thought Content:  Unable to assess  Suicidal Thoughts:  Unable to assess  Homicidal Thoughts:  Unable to assess  Memory:  Unable to assess  Judgement:  Impaired  Insight:  Lacking  Psychomotor Activity:  Decreased  Concentration:  Poor  Recall:  Unable to assess  Fund of Knowledge:  Unable to assess  Language:  Unable to assess  Akathisia:  No  Handed:  Right per records  AIMS (if indicated):     Assets:  Housing Physical Health Resilience Social Support  ADL's:  Intact  Cognition:  Unable to assess  Sleep:  Number of Hours: 8   COGNITIVE FEATURES THAT CONTRIBUTE TO RISK:  Unable to assess due to lack of cooperation for interview  SUICIDE RISK:   Moderate:  Based on recent psychotic symptoms and  disorganized behaviors prior to admission   PLAN OF CARE:  Team will attempt to engage with patient tomorrow when he is hopefully more willing to cooperate for an interview. Will continue Zyprexa 10mg  po qhs for psychosis and mood stabilization and attempting  to get additional labs including UDS, UA, ETOH, CMP, TSH, lipid panel, HBGA1c, EKG, and prolactin.CBC WNL on admission.  Will attempt to get history from family with his consent for additional collateral.    I certify that inpatient services furnished can reasonably be expected to improve the patient's condition.   , MD, FAPA 05/22/2020, 4:54 PM

## 2020-05-22 NOTE — Progress Notes (Signed)
   05/22/20 2100  Psych Admission Type (Psych Patients Only)  Admission Status Voluntary  Psychosocial Assessment  Patient Complaints Anxiety  Eye Contact Intense  Facial Expression Anxious;Pensive  Affect Anxious;Labile;Preoccupied  Speech Logical/coherent  Interaction Avoidant;Cautious;Forwards little;Guarded;Minimal  Motor Activity Slow  Appearance/Hygiene Disheveled;Poor hygiene;In hospital gown  Behavior Characteristics Anxious  Mood Suspicious;Anxious  Thought Process  Coherency Blocking  Content Ambivalence;Blaming others;Paranoia  Delusions Paranoid  Perception Hallucinations  Hallucination Auditory;Visual  Judgment Limited  Confusion Mild  Danger to Self  Current suicidal ideation? Denies  Danger to Others  Danger to Others None reported or observed

## 2020-05-22 NOTE — Plan of Care (Signed)
  Problem: Education: Goal: Ability to state activities that reduce stress will improve Outcome: Progressing   Problem: Coping: Goal: Ability to identify and develop effective coping behavior will improve Outcome: Progressing   Problem: Self-Concept: Goal: Ability to identify factors that promote anxiety will improve Outcome: Progressing   

## 2020-05-22 NOTE — Progress Notes (Signed)
Progress note    05/22/20 0900  Psych Admission Type (Psych Patients Only)  Admission Status Voluntary  Psychosocial Assessment  Patient Complaints Anxiety;Disorientation;Irritability;Restlessness;Suspiciousness  Eye Contact Intense  Facial Expression Anxious;Pensive  Affect Anxious;Labile;Preoccupied  Speech Logical/coherent  Interaction Avoidant;Cautious;Forwards little;Guarded;Minimal  Motor Activity Slow  Appearance/Hygiene Disheveled;Poor hygiene;In hospital gown  Behavior Characteristics Agressive verbally;Anxious;Fidgety;Irritable  Mood Anxious;Labile;Suspicious;Irritable;Pleasant  Thought Process  Coherency Blocking  Content Ambivalence;Blaming others;Paranoia  Delusions Paranoid  Perception Hallucinations  Hallucination Auditory;Visual  Judgment Limited  Confusion Mild  Danger to Self  Current suicidal ideation? Denies  Danger to Others  Danger to Others None reported or observed

## 2020-05-22 NOTE — H&P (Signed)
Psychiatric Admission Assessment Adult  Patient Identification: Billy Rose MRN:  [id number] Date of Evaluation:  05/22/2020 Chief Complaint:  MDD (major depressive disorder), recurrent, severe, with psychosis (HCC) [F33.3] Principal Diagnosis: <principal problem not specified> Diagnosis:  Active Problems:   * No active hospital problems. *  History of Present Illness: (From TTS walk-in assessment note):  Billy Rose is an 19 y.o. male. He presents with bizarre, inappropriate affect and is a poor historian. He states he came to the hospital because "My friends and family say I am not right in the head" but unable to explain further. He states he has heard voices for a "long time" but unable to say what the voices are saying. He denies drug use. Denies SI. He reports HI with no plan/intent or target. He has multiple superficial cuts on his left arm and when asked about this, admits to cutting himself but unable to say when he did this or explain intent.  He provides consent for TTS to gather collateral information from his mother Keontae Levingston [phone]. Ms. Tucker states the patient has been acting bizarre over the last two weeks. He has been pointing at things that are not there and responding to internal stimuli. She reports he has remote history of LSD use as well as marijuana use but does not believe he has used any substances in the last six months. 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 He agreed to come to [facility] after a conversation with his mother. She denies him showing any aggressive or suicidal behaviors in the last six months. He is agreeable to inpatient hospitalization.  Per Social Work note 05/22/20: Summary and Recommendations (to be completed by the evaluator): Patient is a 19 year old male presenting voluntarily to [facility] for assessment. Patient is renders limited history due AMS and is guarded during  assessment. When asked why he is here patient states "My friends and family think I'm not right in the head. I talking to people who aren't there and doing gestures." Patient denies current SI, however admits to self harming by cutting his arm in the past week. He endorses current, passive HI without intent or plan. He endorses AH but is unable to elaborate on them during assessment. Patient does appear to be responding to internal stimuli. He denies any current charges, substance use, or trauma history. Patient denies any difficulty with sleep or appetite. Per chart review, patient has a history of MDD and psychosis. He was hospitalized at [facility] in 2018. Patient will benefit from crisis stabilization, medication evaluation, group therapy and psychoeducation, in addition to case management for discharge planning. At discharge it is recommended that Patient adhere to the established discharge plan and continue in treatment. SPE completed with patient, as patient refused to consent to family contact. SPI pamphlet provided to pt and pt was encouraged to share information with support network, ask questions, and talk about any concerns relating to SPE. Patient denies access to guns/firearms and verbalized understanding of information provided. Mobile Crisis information also provided to patient.  05-22-2020: Evaluation on the unit: Billy Rose is a 19 year old male with a past psychiatric history significant for Substance-induced psychosis, cannabis use disorder, and MDD. Patient is seen in his room. He is sleeping. He is difficult to arouse and gives minimal answers to questions. He stated he is here "because of friends and family." When asked what his family and friends were concerned  about he stated "random gestures". He then stated he wasn't going to answer any questions, specifically about drug use. Labs ordered on 12/20 were not drawn this morning and are rescheduled for this evening. CBC resulted with no  abnormalities. Patient was last hospitalized at [facility] in March 2021 with MDD, recurrent, severe. He currently is not engaging in treatment so it is difficult to discern what the main focus is for his hospitalization. Per collateral from his mother he has been acting bizarre and pointing to things not there. He denied suicidal and homicidal ideation. He stated he sees things but did not answer any questions regarding what he is seeing. Without UDS and lab results it is difficult to understand where the bizarre behavior stems from. He was last released form the hospital on 08/05/2019 after one day. He was discharged on Zyprexa. It is unclear if he has had any follow up medication management or therapy since then. He was restarted on Zyprexa this admission. He does appear to have depressed mood but again it is difficult to fully assess his mood without his participation. He has shown no bizarre behavior on the unit and has remained in his room, in bed, asleep. Will continue to attempt evaluation when patient is better able to participate.  Labs resulted: Covid negative, Influenza negative, CBC result with no abnormalities.  Urine collected, will be resulted this evening. Labs pending: CMP, TSH, Prolactin, HgbA1c, Ethanol, Lipid panel and urinalysis. EKG ordered for baseline QTc.   Patient has not given consent to gather collateral or release information to any family member at this time.    Associated Signs/Symptoms: Depression Symptoms:  depressed mood, anhedonia, Duration of Depression Symptoms: Greater than two weeks  (Hypo) Manic Symptoms:  NOne observed Anxiety Symptoms:  None observed Psychotic Symptoms:  None observed  Duration of Psychotic Symptoms: Less than six months  PTSD Symptoms: NA Total Time spent with patient: 30 minutes  Past Psychiatric History: MDD, recurrent, severe, Substance induces psychosis, cannabis use disorder  Is the patient at risk to self? No.  Has the patient been a  risk to self in the past 6 months? No.  Has the patient been a risk to self within the distant past? No.  Is the patient a risk to others? No.  Has the patient been a risk to others in the past 6 months? No.  Has the patient been a risk to others within the distant past? No.   Prior Inpatient Therapy:  March 2021, July 2020 Prior Outpatient Therapy:  Unknown  Alcohol Screening: 1. How often do you have a drink containing alcohol?: Never 2. How many drinks containing alcohol do you have on a typical day when you are drinking?: 1 or 2 3. How often do you have six or more drinks on one occasion?: Never AUDIT-C Score: 0 4. How often during the last year have you found that you were not able to stop drinking once you had started?: Never 5. How often during the last year have you failed to do what was normally expected from you because of drinking?: Never 6. How often during the last year have you needed a first drink in the morning to get yourself going after a heavy drinking session?: Never 7. How often during the last year have you had a feeling of guilt of remorse after drinking?: Never 8. How often during the last year have you been unable to remember what happened the night before because you had been drinking?: Never  9. Have you or someone else been injured as a result of your drinking?: No 10. Has a relative or friend or a doctor or another health worker been concerned about your drinking or suggested you cut down?: No Alcohol Use Disorder Identification Test Final Score (AUDIT): 0 Alcohol Brief Interventions/Follow-up: AUDIT Score <7 follow-up not indicated Substance Abuse History in the last 12 months:  Yes.   Consequences of Substance Abuse: Medical Consequences:  Inpatient psychiatric admission for stabilization Previous Psychotropic Medications: Yes  Psychological Evaluations: No  Past Medical History:  Past Medical History:  Diagnosis Date  . Headache   . MDD (major depressive  disorder), recurrent, severe, with psychosis (HCC)   . 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 Psychosis (HCC)    History reviewed. No pertinent surgical history. Family History:  Family History  Problem Relation Age of Onset  . Allergic rhinitis Neg Hx   . Asthma Neg Hx   . Eczema Neg Hx   . Urticaria Neg Hx    Family Psychiatric  History: Unknown Tobacco Screening: Have you used any form of tobacco in the last 30 days? (Cigarettes, Smokeless Tobacco, Cigars, and/or Pipes): No Social History:  Social History   Substance and Sexual Activity  Alcohol Use Not Currently   Comment: "can't remember my last drink"     Social History   Substance and Sexual Activity  Drug Use Yes  . Types: Cocaine, LSD, Marijuana   Comment: reports not using pot in 2 weeks and 1 tab LSD 1 mth ago    Additional Social History: Marital status: Single Are you sexually active?:  (NA) What is your sexual orientation?: Straight Has your sexual activity been affected by drugs, alcohol, medication, or emotional stress?: na Does patient have children?: No    Pain Medications: see MAR Prescriptions: see MAR Over the Counter: see MAR History of alcohol / drug use?: Yes Longest period of sobriety (when/how long): 6 months Negative Consequences of Use: Financial,Personal relationships Withdrawal Symptoms:  (Denies) Name of Substance 1: THC 1 - Age of First Use: teens 1 - Amount (size/oz): varies 1 - Frequency: UTA 1 - Duration: UTA 1 - Last Use / Amount: June 2021     Allergies:   Allergies  Allergen Reactions  . Penicillins Other (See Comments), Anaphylaxis and Swelling    Reaction not recalled, but he IS allergic Did it involve swelling of the face/tongue/throat, SOB, or low BP? Unk Did it involve sudden or severe rash/hives, skin peeling, or any reaction on the inside of your mouth or nose? Unk Did you need to seek medical attention at a hospital or doctor's  office? Unk When did it last happen? Childhood If all above answers are "NO", may proceed with cephalosporin use.  Reaction not recalled, but he IS allergic Did it involve swelling of the face/tongue/throat, SOB, or low BP? Unk Did it involve sudden or severe rash/hives, skin peeling, or any reaction on the inside of your mouth or nose? Unk Did you need to seek medical attention at a hospital or doctor's office? Unk When did it last happen? Childhood If all above answers are "NO", may proceed with cephalosporin use.  . Sulfa Antibiotics Anaphylaxis, Shortness Of Breath, Swelling and Rash    Throat swells  . Prozac [Fluoxetine Hcl] Other (See Comments)    Makes mean  . Bactrim [Sulfamethoxazole-Trimethoprim] Rash   Lab Results:  Results for orders placed or performed during the hospital encounter of 05/21/20 (  from the past 48 hour(s))  Resp Panel by RT-PCR (Flu A&B, Covid) Nasopharyngeal Swab     Status: None   Collection Time: 05/21/20  2:35 PM   Specimen: Nasopharyngeal Swab; Nasopharyngeal(NP) swabs in vial transport medium  Result Value Ref Range   SARS Coronavirus 2 by RT PCR NEGATIVE NEGATIVE    Comment: (NOTE) SARS-CoV-2 target nucleic acids are NOT DETECTED.  The SARS-CoV-2 RNA is generally detectable in upper respiratory specimens during the acute phase of infection. The lowest concentration of SARS-CoV-2 viral copies this assay can detect is 138 copies/mL. A negative result does not preclude SARS-Cov-2 infection and should not be used as the sole basis for treatment or other patient management decisions. A negative result may occur with  improper specimen collection/handling, submission of specimen other than nasopharyngeal swab, presence of viral mutation(s) within the areas targeted by this assay, and inadequate number of viral copies(<138 copies/mL). A negative result must be combined with clinical observations, patient history, and epidemiological information. The  expected result is Negative.  Fact Sheet for Patients:  BloggerCourse.com  Fact Sheet for Healthcare Providers:  SeriousBroker.it  This test is no t yet approved or cleared by the [place] FDA and  has been authorized for detection and/or diagnosis of SARS-CoV-2 by FDA under an Emergency Use Authorization (EUA). This EUA will remain  in effect (meaning this test can be used) for the duration of the COVID-19 declaration under Section 564(b)(1) of the Act, 21 U.S.C.section 360bbb-3(b)(1), unless the authorization is terminated  or revoked sooner.       Influenza A by PCR NEGATIVE NEGATIVE   Influenza B by PCR NEGATIVE NEGATIVE    Comment: (NOTE) The Xpert Xpress SARS-CoV-2/FLU/RSV plus assay is intended as an aid in the diagnosis of influenza from Nasopharyngeal swab specimens and should not be used as a sole basis for treatment. Nasal washings and aspirates are unacceptable for Xpert Xpress SARS-CoV-2/FLU/RSV testing.  Fact Sheet for Patients: BloggerCourse.com  Fact Sheet for Healthcare Providers: SeriousBroker.it  This test is not yet approved or cleared by the [place] FDA and has been authorized for detection and/or diagnosis of SARS-CoV-2 by FDA under an Emergency Use Authorization (EUA). This EUA will remain in effect (meaning this test can be used) for the duration of the COVID-19 declaration under Section 564(b)(1) of the Act, 21 U.S.C. section 360bbb-3(b)(1), unless the authorization is terminated or revoked.  Performed at [facility], [address] [place], [place] [address]   CBC with Differential/Platelet     Status: None   Collection Time: 05/21/20  5:51 PM  Result Value Ref Range   WBC 7.6 4.0 - 10.5 K/uL   RBC 5.09 4.22 - 5.81 MIL/uL   Hemoglobin 15.6 13.0 - 17.0 g/dL   HCT 76.2 83.1 - 51.7 %   MCV 88.6 80.0 - 100.0 fL    MCH 30.6 26.0 - 34.0 pg   MCHC 34.6 30.0 - 36.0 g/dL   RDW 61.6 07.3 - 71.0 %   Platelets 300 150 - 400 K/uL   nRBC 0.0 0.0 - 0.2 %   Neutrophils Relative % 55 %   Neutro Abs 4.2 1.7 - 7.7 K/uL   Lymphocytes Relative 31 %   Lymphs Abs 2.4 0.7 - 4.0 K/uL   Monocytes Relative 8 %   Monocytes Absolute 0.6 0.1 - 1.0 K/uL   Eosinophils Relative 5 %   Eosinophils Absolute 0.4 0.0 - 0.5 K/uL   Basophils Relative 1 %  Basophils Absolute 0.0 0.0 - 0.1 K/uL   Immature Granulocytes 0 %   Abs Immature Granulocytes 0.01 0.00 - 0.07 K/uL    Comment: Performed at [facility], [address], [place], [place] [address]    Blood Alcohol level:  Lab Results  Component Value Date   [facility] <10 08/05/2019   ETH <10 12/15/2018    Metabolic Disorder Labs:  Lab Results  Component Value Date   HGBA1C 4.9 08/05/2019   MPG 93.93 08/05/2019   MPG 93.93 02/16/2017   Lab Results  Component Value Date   PROLACTIN 32.1 (H) 02/15/2017   Lab Results  Component Value Date   CHOL 120 08/05/2019   TRIG 64 08/05/2019   HDL 48 08/05/2019   CHOLHDL 2.5 08/05/2019   VLDL 13 08/05/2019   LDLCALC 59 08/05/2019   LDLCALC 36 02/15/2017    Current Medications: Current Facility-Administered Medications  Medication Dose Route Frequency Provider Last Rate Last Admin  . acetaminophen (TYLENOL) tablet 650 mg  650 mg Oral Q6H PRN Aldean Baker, NP      . alum & mag hydroxide-simeth (MAALOX/MYLANTA) 200-200-20 MG/5ML suspension 30 mL  30 mL Oral Q4H PRN Aldean Baker, NP      . hydrOXYzine (ATARAX/VISTARIL) tablet 25 mg  25 mg Oral TID PRN Aldean Baker, NP      . magnesium hydroxide (MILK OF MAGNESIA) suspension 30 mL  30 mL Oral Daily PRN Aldean Baker, NP      . OLANZapine zydis (ZYPREXA) disintegrating tablet 10 mg  10 mg Oral QHS Aldean Baker, NP   10 mg at 05/21/20 2050  . OLANZapine zydis (ZYPREXA) disintegrating tablet 5 mg  5 mg Oral Q8H PRN Aldean Baker, NP       And  .  ziprasidone (GEODON) injection 20 mg  20 mg Intramuscular PRN Aldean Baker, NP      . traZODone (DESYREL) tablet 50 mg  50 mg Oral QHS PRN,MR X 1 Aldean Baker, NP   50 mg at 05/21/20 2050   PTA Medications: No medications prior to admission.    Musculoskeletal: Strength & Muscle Tone: within normal limits Gait & Station: normal Patient leans: N/A  Psychiatric Specialty Exam: Physical Exam Constitutional:      Appearance: Normal appearance.  HENT:     Head: Normocephalic.  Musculoskeletal:        General: Normal range of motion.     Cervical back: Normal range of motion.  Skin:    General: Skin is warm.  Neurological:     Mental Status: He is alert and oriented to person, place, and time.  Psychiatric:        Attention and Perception: Attention normal.        Mood and Affect: Mood and affect normal.        Speech: Speech normal.        Behavior: Behavior normal. Behavior is cooperative.        Cognition and Memory: Cognition normal.     Review of Systems  Constitutional: Negative for activity change and appetite change.  Respiratory: Negative for chest tightness and shortness of breath.   Cardiovascular: Negative for chest pain.  Gastrointestinal: Negative for abdominal pain.  Neurological: Negative for facial asymmetry and headaches.   Blood pressure 137/87, pulse 74, temperature (!) 97.1 F (36.2 C), temperature source Oral, resp. rate 18, height  (1.753 m), weight 55 kg, SpO2 99 %.Body mass index is  17.91 kg/m.  General Appearance: Casual  Eye Contact:  Minimal  Speech:  Slow  Volume:  Decreased  Mood:  Depressed and sleeping  Affect:  Congruent and Depressed  Thought Process:  Coherent  Orientation:  Full (Time, Place, and Person)  Thought Content:  Unable to assess, patient refuses to answer questions  Suicidal Thoughts:  No  Homicidal Thoughts:  No  Memory:  Unable to assess, patient refuses to answer questions  Judgement:  Impaired  Insight:   Lacking  Psychomotor Activity:  Decreased  Concentration:  Concentration: Poor  Recall:  Fair  Fund of Knowledge:  Fair  Language:  Fair  Akathisia:  No  Handed:  Right  AIMS (if indicated):     Assets:  Health and safety inspector Housing Physical Health Resilience Social Support  ADL's:  Intact  Cognition:  WNL  Sleep:  Number of Hours: 8    Treatment Plan Summary: Daily contact with patient to assess and evaluate symptoms and progress in treatment and Medication management   1. Obtain lab results already ordered 2. Continue to attempt to engage patient in treatment 3. Continue Zyprexa 10 mg PO QHS for psychosis/mood stabilization 4. Continue Vistaril 25 mg PO q6hr PRN for anxiety 5. Continue Trazodone 50 mg PO QHS PRN for sleep 6. Encourage participation in the therapeutic milieu 7. Zyprexa Zydis 5 mg PO or Geodon 20 mg IM for agitation if needed 8. Discharge planning in process  Observation Level/Precautions:  15 minute checks  Laboratory:  Chemistry Profile HbAIC UDS UA TSH, Prolactin, Lipid profile ordered, not drawn as of 12/21 @1330 . Rescheduled for evening draw.   Psychotherapy:  Group and milieu therapy  Medications:  Zyprexa,Trazodone, Vistaril  Consultations:  TBD  Discharge Concerns:  Safety, medication compliance  Estimated LOS: 5-7 days  Other:     Physician Treatment Plan for Primary Diagnosis: <principal problem not specified> Long Term Goal(s): Improvement in symptoms so as ready for discharge  Short Term Goals: Ability to identify changes in lifestyle to reduce recurrence of condition will improve, Ability to verbalize feelings will improve, Ability to identify and develop effective coping behaviors will improve, Compliance with prescribed medications will improve and Ability to identify triggers associated with substance abuse/mental health issues will improve  Physician Treatment Plan for Secondary Diagnosis: Active Problems:   * No active  hospital problems. *  Long Term Goal(s): Improvement in symptoms so as ready for discharge  Short Term Goals: Ability to identify changes in lifestyle to reduce recurrence of condition will improve, Ability to identify and develop effective coping behaviors will improve, Compliance with prescribed medications will improve and Ability to identify triggers associated with substance abuse/mental health issues will improve  I certify that inpatient services furnished can reasonably be expected to improve the patient's condition.    , NP 12/21/20211:25 PM

## 2020-05-22 NOTE — Progress Notes (Signed)
The focus of this group is to help patients establish daily goals to achieve during treatment and discuss how the patient can incorporate goal setting into their daily lives to aide in recovery.Patient attended group. 
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
## 2020-05-22 NOTE — Progress Notes (Signed)
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)  Coping Skills:   Isolation,Arguments,Music,Exercise,Meditate,[employer] features editor  Leisure Interests (2+):  Music - Play instrument,Art - Draw,Individual - Reading  Frequency of Recreation/Participation: Other (Comment) (Daily)  Awareness of Community Resources:  No  [place] of Residence:  [place]  Patient Main Form of Transportation: Set designer  Patient Strengths:  Guitar  Patient Identified Areas of Improvement:  None  Patient Goal for Hospitalization:  "relax, eat food and wait for discharge"  Current SI (including self-harm):  No  Current HI:  No  Current AVH: No  Staff Intervention Plan: Group Attendance,Collaborate with Interdisciplinary Treatment Team  Consent to Intern Participation: N/A   Billy Rose, LRT/CTRS  Billy Rose A 05/22/2020, 12:50 PM

## 2020-05-23 MED ORDER — OLANZAPINE 5 MG PO TBDP
15.0000 mg | ORAL_TABLET | Freq: Every day | ORAL | Status: DC
  Administered 2020-05-23 – 2020-05-25 (×3): 15 mg via ORAL
  Filled 2020-05-23 (×5): qty 1

## 2020-05-23 NOTE — Tx Team (Signed)
Interdisciplinary Treatment and Diagnostic Plan Update  05/23/2020 Time of Session: 9:35am Billy Rose MRN: [id number]  Principal Diagnosis: Schizophrenia spectrum disorder with psychotic disorder type not yet determined [facility])  Secondary Diagnoses: Principal Problem:   Schizophrenia spectrum disorder with psychotic disorder type not yet determined ([name])   Current Medications:  Current Facility-Administered Medications  Medication Dose Route Frequency Provider Last Rate Last Admin  . acetaminophen (TYLENOL) tablet 650 mg  650 mg Oral Q6H PRN Connye Burkitt, NP      . alum & mag hydroxide-simeth (MAALOX/MYLANTA) 200-200-20 MG/5ML suspension 30 mL  30 mL Oral Q4H PRN Connye Burkitt, NP      . hydrOXYzine (ATARAX/VISTARIL) tablet 25 mg  25 mg Oral TID PRN Connye Burkitt, NP      . magnesium hydroxide (MILK OF MAGNESIA) suspension 30 mL  30 mL Oral Daily PRN Connye Burkitt, NP      . OLANZapine zydis (ZYPREXA) disintegrating tablet 10 mg  10 mg Oral QHS Connye Burkitt, NP   10 mg at 05/22/20 2049  . OLANZapine zydis (ZYPREXA) disintegrating tablet 5 mg  5 mg Oral Q8H PRN Connye Burkitt, NP       And  . ziprasidone (GEODON) injection 20 mg  20 mg Intramuscular PRN Connye Burkitt, NP      . traZODone (DESYREL) tablet 50 mg  50 mg Oral QHS PRN,MR X 1 Connye Burkitt, NP   50 mg at 05/22/20 2049   PTA Medications: No medications prior to admission.    Patient Stressors: Financial difficulties Health problems Medication change or noncompliance Occupational concerns  Patient Strengths: Physical Health Supportive family/friends  Treatment Modalities: Medication Management, Group therapy, Case management,  1 to 1 session with clinician, Psychoeducation, Recreational therapy.   Physician Treatment Plan for Primary Diagnosis: Schizophrenia spectrum disorder with psychotic disorder type not yet determined ([name]) Long Term Goal(s): Improvement in symptoms so as ready for  discharge Improvement in symptoms so as ready for discharge   Short Term Goals: Ability to identify changes in lifestyle to reduce recurrence of condition will improve Ability to verbalize feelings will improve Ability to identify and develop effective coping behaviors will improve Compliance with prescribed medications will improve Ability to identify triggers associated with substance abuse/mental health issues will improve Ability to identify changes in lifestyle to reduce recurrence of condition will improve Ability to identify and develop effective coping behaviors will improve Compliance with prescribed medications will improve Ability to identify triggers associated with substance abuse/mental health issues will improve  Medication Management: Evaluate patient's response, side effects, and tolerance of medication regimen.  Therapeutic Interventions: 1 to 1 sessions, Unit Group sessions and Medication administration.  Evaluation of Outcomes: Not Met  Physician Treatment Plan for Secondary Diagnosis: Principal Problem:   Schizophrenia spectrum disorder with psychotic disorder type not yet determined ([name])  Long Term Goal(s): Improvement in symptoms so as ready for discharge Improvement in symptoms so as ready for discharge   Short Term Goals: Ability to identify changes in lifestyle to reduce recurrence of condition will improve Ability to verbalize feelings will improve Ability to identify and develop effective coping behaviors will improve Compliance with prescribed medications will improve Ability to identify triggers associated with substance abuse/mental health issues will improve Ability to identify changes in lifestyle to reduce recurrence of condition will improve Ability to identify and develop effective coping behaviors will improve Compliance with prescribed medications will improve Ability to identify triggers associated with substance abuse/mental  health issues will  improve     Medication Management: Evaluate patient's response, side effects, and tolerance of medication regimen.  Therapeutic Interventions: 1 to 1 sessions, Unit Group sessions and Medication administration.  Evaluation of Outcomes: Not Met   RN Treatment Plan for Primary Diagnosis: Schizophrenia spectrum disorder with psychotic disorder type not yet determined ([name]) Long Term Goal(s): Knowledge of disease and therapeutic regimen to maintain health will improve  Short Term Goals: Ability to demonstrate self-control, Ability to participate in decision making will improve and Ability to verbalize feelings will improve  Medication Management: RN will administer medications as ordered by provider, will assess and evaluate patient's response and provide education to patient for prescribed medication. RN will report any adverse and/or side effects to prescribing provider.  Therapeutic Interventions: 1 on 1 counseling sessions, Psychoeducation, Medication administration, Evaluate responses to treatment, Monitor vital signs and CBGs as ordered, Perform/monitor CIWA, COWS, AIMS and Fall Risk screenings as ordered, Perform wound care treatments as ordered.  Evaluation of Outcomes: Not Met   LCSW Treatment Plan for Primary Diagnosis: Schizophrenia spectrum disorder with psychotic disorder type not yet determined ([name]) Long Term Goal(s): Safe transition to appropriate next level of care at discharge, Engage patient in therapeutic group addressing interpersonal concerns.  Short Term Goals: Engage patient in aftercare planning with referrals and resources, Increase ability to appropriately verbalize feelings and Increase emotional regulation  Therapeutic Interventions: Assess for all discharge needs, 1 to 1 time with Social worker, Explore available resources and support systems, Assess for adequacy in community support network, Educate family and significant other(s) on suicide prevention, Complete  Psychosocial Assessment, Interpersonal group therapy.  Evaluation of Outcomes: Not Met   Progress in Treatment: Attending groups: No. Participating in groups: No. Taking medication as prescribed: Yes. Toleration medication: Yes. Family/Significant other contact made: No, will contact:  Pt declined consents Patient understands diagnosis: No. Discussing patient identified problems/goals with staff: Yes. Medical problems stabilized or resolved: No. Denies suicidal/homicidal ideation: Yes. Issues/concerns per patient self-inventory: No. Other:   New problem(s) identified: No, Describe:  None  New Short Term/Long Term Goal(s):medication stabilization, elimination of SI thoughts, development of comprehensive mental wellness plan.  Patient Goals:  "to discharge"  Discharge Plan or Barriers: Patient recently admitted. CSW will continue to follow and assess for appropriate referrals and possible discharge planning.  Reason for Continuation of Hospitalization: Hallucinations Medication stabilization  Estimated Length of Stay:  Attendees: Patient:Billy Rose 05/23/2020 11:00 AM  Physician: Dr. Claris Gower 05/23/2020 11:00 AM  Nursing:  05/23/2020 11:00 AM  RN Care Manager: 05/23/2020 11:00 AM  Social Worker: Toney Reil, [place] 05/23/2020 11:00 AM  Recreational Therapist:  05/23/2020 11:00 AM  Other:  05/23/2020 11:00 AM  Other:  05/23/2020 11:00 AM  Other: 05/23/2020 11:00 AM    Scribe for Treatment Team: Mliss Fritz, [place] 05/23/2020 11:00 AM

## 2020-05-23 NOTE — Progress Notes (Addendum)
[facility] MD Progress Note  05/23/2020 12:20 PM Billy Rose  MRN:  [id number]   Subjective:  Billy Rose is a 19 y.o. 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 He was initially admitted for inpatient psychiatric hospitalization on 05/21/2020 for management of disorganized and bizarre behaviors. The patient is currently on Hospital Day 2.   Chart Review from last 24 hours:  The patient's chart was reviewed and nursing notes were reviewed. The patient's case was discussed in multidisciplinary team meeting. Per nursing notes he has been guarded, disheveled, and paranoid. Per [facility] he was compliant with Zyprexa scheduled dose overnight and did receive PRN Trazodone for sleep. He did not require PRN medications for agitation.  Information Obtained Today During Patient Interview: The patient was seen and evaluated in his room. On assessment today the patient is alert and willing to engage in conversation. He provides limited details and is vague in his descriptions. He states he is here "because family wanted me evaluated." When asked about what was going on prior to admission he states "I was acting bizarre" but he cannot give description or details of events. He states "I must have been talking to myself." He denies SI or HI. He states his mood is 10/10 and denies feeling anxious or depressed. He denies command AH. He admits to having AVH prior to admission but will not discuss content or frequency of AVH and states AVH has resolved. When asked about hyperreligious thinking noted by family prior to admission, he becomes paranoid and refuses to answer. He denies ideas of reference or first rank symptoms. He states his medication has made him sleepy but he denies other medication side-effects. He states he slept well  overnight and is eating well.   Principal Problem: Schizophrenia spectrum disorder with psychotic disorder type not yet determined (HCC) Diagnosis: Principal Problem:   Schizophrenia spectrum disorder with psychotic disorder type not yet determined (HCC)  Total Time Spent in Direct Patient Care:  I personally spent 35 minutes on the unit in direct patient care. The direct patient care time included face-to-face time with the patient, reviewing the patient's chart, communicating with other professionals, and coordinating care. Greater than 50% of this time was spent in counseling or coordinating care with the patient regarding goals of hospitalization, psycho-education, and discharge planning needs.  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 He was admitted here in 3/21, 7/20, 4/20, 3/20, and 9/18.  Past Medical History:  Past Medical History:  Diagnosis Date  . Headache   . MDD (major depressive disorder), recurrent, severe, with psychosis (HCC)   . 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 Psychosis (HCC)    History reviewed. No pertinent surgical history. Family History:  Family History  Problem Relation Age of Onset  . Allergic rhinitis Neg Hx   . Asthma Neg Hx   . Eczema Neg Hx   . Urticaria Neg Hx    Family Psychiatric  History: (per admission H&P) unknown  Social History:  Social History   Substance and Sexual Activity  Alcohol Use Not Currently   Comment: "can't remember my last drink"     Social History  Substance and Sexual Activity  Drug Use Yes  . Types: Cocaine, LSD, Marijuana   Comment: reports not using pot in 2 weeks and 1 tab LSD 1 mth ago    Additional Social History:    Pain Medications: see MAR Prescriptions: see MAR Over the Counter: see  MAR History of alcohol / drug use?: Yes Longest period of sobriety (when/how long): 6 months Negative Consequences of Use: Financial,Personal relationships Withdrawal Symptoms:  (Denies) Name of Substance 1: THC 1 - Age of First Use: teens 1 - Amount (size/oz): varies 1 - Frequency: UTA 1 - Duration: UTA 1 - Last Use / Amount: June 2021  Sleep: Good per patient report - slept 7.75 hours per nursing  Appetite:  Good  Current Medications: Current Facility-Administered Medications  Medication Dose Route Frequency Provider Last Rate Last Admin  . acetaminophen (TYLENOL) tablet 650 mg  650 mg Oral Q6H PRN Aldean BakerSykes, Janet E, NP      . alum & mag hydroxide-simeth (MAALOX/MYLANTA) 200-200-20 MG/5ML suspension 30 mL  30 mL Oral Q4H PRN Aldean BakerSykes, Janet E, NP      . hydrOXYzine (ATARAX/VISTARIL) tablet 25 mg  25 mg Oral TID PRN Aldean BakerSykes, Janet E, NP      . magnesium hydroxide (MILK OF MAGNESIA) suspension 30 mL  30 mL Oral Daily PRN Aldean BakerSykes, Janet E, NP      . OLANZapine zydis (ZYPREXA) disintegrating tablet 10 mg  10 mg Oral QHS Aldean BakerSykes, Janet E, NP   10 mg at 05/22/20 2049  . OLANZapine zydis (ZYPREXA) disintegrating tablet 5 mg  5 mg Oral Q8H PRN Aldean BakerSykes, Janet E, NP       And  . ziprasidone (GEODON) injection 20 mg  20 mg Intramuscular PRN Aldean BakerSykes, Janet E, NP      . traZODone (DESYREL) tablet 50 mg  50 mg Oral QHS PRN,MR X 1 Aldean BakerSykes, Janet E, NP   50 mg at 05/22/20 2049   Lab Results:  Results for orders placed or performed during the hospital encounter of 05/21/20 (from the past 48 hour(s))  Urinalysis, Routine w reflex microscopic Urine, Clean Catch     Status: Abnormal   Collection Time: 05/21/20  2:09 PM  Result Value Ref Range   Color, Urine YELLOW YELLOW   APPearance CLOUDY (A) CLEAR   Specific Gravity, Urine 1.028 1.005 - 1.030   pH 6.0 5.0 - 8.0   Glucose, UA NEGATIVE NEGATIVE mg/dL   Hgb urine dipstick NEGATIVE NEGATIVE   Bilirubin Urine NEGATIVE NEGATIVE   Ketones, ur NEGATIVE NEGATIVE  mg/dL   Protein, ur NEGATIVE NEGATIVE mg/dL   Nitrite NEGATIVE NEGATIVE   Leukocytes,Ua NEGATIVE NEGATIVE    Comment: Performed at [facility], [address] [place], [place] [id number]  Urine rapid drug screen (hosp performed)not at [facility]     Status: None   Collection Time: 05/21/20  2:09 PM  Result Value Ref Range   Opiates NONE DETECTED NONE DETECTED   Cocaine NONE DETECTED NONE DETECTED   Benzodiazepines NONE DETECTED NONE DETECTED   Amphetamines NONE DETECTED NONE DETECTED   Tetrahydrocannabinol NONE DETECTED NONE DETECTED   Barbiturates NONE DETECTED NONE DETECTED    Comment: (NOTE) DRUG SCREEN FOR MEDICAL PURPOSES ONLY.  IF CONFIRMATION IS NEEDED FOR ANY PURPOSE, NOTIFY LAB WITHIN 5 DAYS.  LOWEST DETECTABLE LIMITS FOR URINE DRUG SCREEN Drug Class                     Cutoff (ng/mL) Amphetamine and  metabolites    1000 Barbiturate and metabolites    200 Benzodiazepine                 200 Tricyclics and metabolites     300 Opiates and metabolites        300 Cocaine and metabolites        300 THC                            50 Performed at [facility], [address], [place], [place] [address]   Resp Panel by RT-PCR (Flu A&B, Covid) Nasopharyngeal Swab     Status: None   Collection Time: 05/21/20  2:35 PM   Specimen: Nasopharyngeal Swab; Nasopharyngeal(NP) swabs in vial transport medium  Result Value Ref Range   SARS Coronavirus 2 by RT PCR NEGATIVE NEGATIVE    Comment: (NOTE) SARS-CoV-2 target nucleic acids are NOT DETECTED.  The SARS-CoV-2 RNA is generally detectable in upper respiratory specimens during the acute phase of infection. The lowest concentration of SARS-CoV-2 viral copies this assay can detect is 138 copies/mL. A negative result does not preclude SARS-Cov-2 infection and should not be used as the sole basis for treatment or other patient management decisions. A negative result may occur with  improper specimen  collection/handling, submission of specimen other than nasopharyngeal swab, presence of viral mutation(s) within the areas targeted by this assay, and inadequate number of viral copies(<138 copies/mL). A negative result must be combined with clinical observations, patient history, and epidemiological information. The expected result is Negative.  Fact Sheet for Patients:  BloggerCourse.com  Fact Sheet for Healthcare Providers:  SeriousBroker.it  This test is no t yet approved or cleared by the [place] FDA and  has been authorized for detection and/or diagnosis of SARS-CoV-2 by FDA under an Emergency Use Authorization (EUA). This EUA will remain  in effect (meaning this test can be used) for the duration of the COVID-19 declaration under Section 564(b)(1) of the Act, 21 U.S.C.section 360bbb-3(b)(1), unless the authorization is terminated  or revoked sooner.       Influenza A by PCR NEGATIVE NEGATIVE   Influenza B by PCR NEGATIVE NEGATIVE    Comment: (NOTE) The Xpert Xpress SARS-CoV-2/FLU/RSV plus assay is intended as an aid in the diagnosis of influenza from Nasopharyngeal swab specimens and should not be used as a sole basis for treatment. Nasal washings and aspirates are unacceptable for Xpert Xpress SARS-CoV-2/FLU/RSV testing.  Fact Sheet for Patients: BloggerCourse.com  Fact Sheet for Healthcare Providers: SeriousBroker.it  This test is not yet approved or cleared by the [place] FDA and has been authorized for detection and/or diagnosis of SARS-CoV-2 by FDA under an Emergency Use Authorization (EUA). This EUA will remain in effect (meaning this test can be used) for the duration of the COVID-19 declaration under Section 564(b)(1) of the Act, 21 U.S.C. section 360bbb-3(b)(1), unless the authorization is terminated or revoked.  Performed at [facility], [address], [place], [place] [address]   CBC with Differential/Platelet     Status: None   Collection Time: 05/21/20  5:51 PM  Result Value Ref Range   WBC 7.6 4.0 - 10.5 K/uL   RBC 5.09 4.22 - 5.81 MIL/uL   Hemoglobin 15.6 13.0 - 17.0 g/dL   HCT 09.8 11.9 - 14.7 %   MCV 88.6 80.0 - 100.0 fL   MCH 30.6 26.0 - 34.0 pg   MCHC  34.6 30.0 - 36.0 g/dL   RDW 16.1 09.6 - 04.5 %   Platelets 300 150 - 400 K/uL   nRBC 0.0 0.0 - 0.2 %   Neutrophils Relative % 55 %   Neutro Abs 4.2 1.7 - 7.7 K/uL   Lymphocytes Relative 31 %   Lymphs Abs 2.4 0.7 - 4.0 K/uL   Monocytes Relative 8 %   Monocytes Absolute 0.6 0.1 - 1.0 K/uL   Eosinophils Relative 5 %   Eosinophils Absolute 0.4 0.0 - 0.5 K/uL   Basophils Relative 1 %   Basophils Absolute 0.0 0.0 - 0.1 K/uL   Immature Granulocytes 0 %   Abs Immature Granulocytes 0.01 0.00 - 0.07 K/uL    Comment: Performed at [facility], [address] [place], [place] [address]  Comprehensive metabolic panel     Status: Abnormal   Collection Time: 05/22/20  6:20 PM  Result Value Ref Range   Sodium 141 135 - 145 mmol/L   Potassium 3.9 3.5 - 5.1 mmol/L   Chloride 102 98 - 111 mmol/L   CO2 27 22 - 32 mmol/L   Glucose, Bld 112 (H) 70 - 99 mg/dL    Comment: Glucose reference range applies only to samples taken after fasting for at least 8 hours.   BUN 19 6 - 20 mg/dL   Creatinine, Ser 1.91 0.61 - 1.24 mg/dL   Calcium 9.4 8.9 - 47.8 mg/dL   Total Protein 7.5 6.5 - 8.1 g/dL   Albumin 4.6 3.5 - 5.0 g/dL   AST 23 15 - 41 U/L   ALT 30 0 - 44 U/L   Alkaline Phosphatase 63 38 - 126 U/L   Total Bilirubin 0.3 0.3 - 1.2 mg/dL   GFR, Estimated >29 >56 mL/min    Comment: (NOTE) Calculated using the CKD-EPI Creatinine Equation (2021)    Anion gap 12 5 - 15    Comment: Performed at [facility], [address], [place], [place] [address]  Ethanol     Status: None   Collection Time: 05/22/20  6:20 PM  Result  Value Ref Range   Alcohol, Ethyl (B) <10 <10 mg/dL    Comment: (NOTE) Lowest detectable limit for serum alcohol is 10 mg/dL.  For medical purposes only. Performed at [facility], [address], [place], [place] [address]   Hemoglobin A1c     Status: None   Collection Time: 05/22/20  6:20 PM  Result Value Ref Range   Hgb A1c MFr Bld 4.9 4.8 - 5.6 %    Comment: (NOTE) Pre diabetes:          5.7%-6.4%  Diabetes:              >6.4%  Glycemic control for   <7.0% adults with diabetes    Mean Plasma Glucose 93.93 mg/dL    Comment: Performed at [facility], [address], [place], [place] [address]  Lipid panel     Status: Abnormal   Collection Time: 05/22/20  6:20 PM  Result Value Ref Range   Cholesterol 143 0 - 200 mg/dL   Triglycerides 528 (H) <150 mg/dL   HDL 68 >41 mg/dL   Total CHOL/HDL Ratio 2.1 RATIO   VLDL 33 0 - 40 mg/dL   LDL Cholesterol 42 0 - 99 mg/dL    Comment:        Total Cholesterol/HDL:CHD Risk Coronary Heart Disease Risk Table  Men   Women  1/2 Average Risk   3.4   3.3  Average Risk       5.0   4.4  2 X Average Risk   9.6   7.1  3 X Average Risk  23.4   11.0        Use the calculated Patient Ratio above and the CHD Risk Table to determine the patient's CHD Risk.        ATP III CLASSIFICATION (LDL):  <100     mg/dL   Optimal  073-710  mg/dL   Near or Above                    Optimal  130-159  mg/dL   Borderline  626-948  mg/dL   High  >546     mg/dL   Very High Performed at [facility], [address] [place], [place] [address]   TSH     Status: None   Collection Time: 05/22/20  6:20 PM  Result Value Ref Range   TSH 0.644 0.350 - 4.500 uIU/mL    Comment: Performed by a 3rd Generation assay with a functional sensitivity of <=0.01 uIU/mL. Performed at [facility], [address] [place], [place] [address]     Blood Alcohol level:  Lab Results  Component  Value Date   [facility] <10 05/22/2020   ETH <10 08/05/2019    Metabolic Disorder Labs: Lab Results  Component Value Date   HGBA1C 4.9 05/22/2020   MPG 93.93 05/22/2020   MPG 93.93 08/05/2019   Lab Results  Component Value Date   PROLACTIN 32.1 (H) 02/15/2017   Lab Results  Component Value Date   CHOL 143 05/22/2020   TRIG 164 (H) 05/22/2020   HDL 68 05/22/2020   CHOLHDL 2.1 05/22/2020   VLDL 33 05/22/2020   LDLCALC 42 05/22/2020   LDLCALC 59 08/05/2019   Physical Findings: AIMS: Facial and Oral Movements Muscles of Facial Expression: None, normal Lips and Perioral Area: None, normal Jaw: None, normal Tongue: None, normal,Extremity Movements Upper (arms, wrists, hands, fingers): None, normal Lower (legs, knees, ankles, toes): None, normal, Trunk Movements Neck, shoulders, hips: None, normal, Overall Severity Severity of abnormal movements (highest score from questions above): None, normal Incapacitation due to abnormal movements: None, normal Patient's awareness of abnormal movements (rate only patient's report): No Awareness, Dental Status Current problems with teeth and/or dentures?: No Does patient usually wear dentures?: No   Musculoskeletal: Strength & Muscle Tone: within normal limits Gait & Station: normal, steady Patient leans: N/A  Psychiatric Specialty Exam: Physical Exam HENT:     Head: Normocephalic.  Pulmonary:     Effort: Pulmonary effort is normal.  Musculoskeletal:        General: Normal range of motion.  Skin:    General: Skin is warm and dry.  Neurological:     Mental Status: He is alert.     Review of Systems  Respiratory: Negative for shortness of breath.   Cardiovascular: Negative for chest pain.  Gastrointestinal: Negative for nausea and vomiting.  Neurological: Negative for headaches.    Blood pressure 137/87, pulse 74, temperature (!) 97.1 F (36.2 C), temperature source Oral, resp. rate 18, height 5\' 9"  (1.753 m), weight 55 kg, SpO2  99 %.Body mass index is 17.91 kg/m.  General Appearance: disheveled wearing hospital gown; superficially engaged with examiner, appears stated age  Eye Contact:  Fair - looks about room during exam  Speech:  minimal - speaks when asked direct questions; normal fluency  Volume:  Normal  Mood:  aloof, suspicious  Affect:  restricted, guarded  Thought Process:  Concrete, intermittent thought blocking  Orientation:  Oriented to self, city, year and month  Thought Content:  Denies current AVH or command AH, denies ideas of reference or first rank symptoms but body language suggests that he may be responding to internal stimuli at times on exam; appears paranoid on exam; no delusions elicited  Suicidal Thoughts:  No  Homicidal Thoughts:  No  Memory:  Would not cooperate for remote testing; recent recall of events poor  Judgement:  Impaired  Insight:  Lacking  Psychomotor Activity:  Normal  Concentration:  Concentration: Poor and Attention Span: Poor  Recall:  would not cooperate for testing  Fund of Knowledge:  Fair  Language:  Good  Akathisia:  No  AIMS (if indicated):   0  Assets:  Desire for Improvement Resilience Social Support  ADL's:  Impaired  Cognition:  Impaired,  Mild  Sleep:  Number of Hours: 7.75   Treatment Plan Summary: ASSESSMENT: Billy Rose is a 19 y.o. 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 He was initially admitted for inpatient psychiatric hospitalization on 05/21/2020 for management of disorganized and bizarre behaviors. Today he is no longer refusing to speak but he remains guarded, paranoid, and has evidence of thought blocking on exam.   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 Safety and Monitoring:  -- Voluntary admission to inpatient psychiatric unit for safety, stabilization and treatment  -- Daily contact with patient to assess and evaluate symptoms and progress in treatment  -- Patient's case to be discussed in multi-disciplinary team meeting  -- Observation Level : q15 minute checks  -- Vital signs:  q12 hours  -- Precautions: suicide  2. Psychiatric Diagnoses and Treatment:   Unspecified schizophrenia spectrum and other psychotic d/o (r/o MDD with psychotic features,  r/o schizoaffective d/o, r/o schizophreniform d/o or schizophrenia, r/o substance related  psychotic d/o, r/o psychosis related to a general medical condition) -- Continue Zyprexa and increase dose to 15mg  qhs tonight for residual psychotic symptoms- The risks/benefits/side-effects/alternatives to this medication including risk of developing TD/EPS, weight gain, EKG changes, DM, and high cholesterol were discussed in detail with the patient and time was given for questions. The patient consents to medication trial.  -- In review or records he has not had organic labs to w/u psychosis - therefore will order RPR, heavy metal screen, HIV, ceruloplasmin, ANA, Sed rate, CRP( UDS negative, TSH 0.644 and WBC 7.6 on admission and Head CT noncontrast in March 2020 negative)  -- Metabolic profile and EKG monitoring obtained while on an atypical antipsychotic (BMI: 17.91 Lipid  Panel: Total cholesterol 143, HDL 68, LDL 42, triglycerides 164; HbgA1c: 4.9 QTc: pending)   -- Encouraged patient to participate in unit milieu and in scheduled group therapies   -- Attempting to get more collateral from family with his signed consent  -- Short Term Goals: Ability to  identify changes in lifestyle  to reduce recurrence of condition will improve, Ability to verbalize feelings will improve, Ability to demonstrate self-control will improve, Ability to identify and develop effective coping behaviors will improve, Compliance with prescribed medications will improve and Ability to identify triggers associated with substance abuse/mental health issues will improve  -- Long Term Goals: Improvement in symptoms so as ready for discharge   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 Discharge Planning:   -- Social work and case management to assist with discharge planning and identification of hospital follow-up needs prior to discharge  -- Estimated LOS: 5-7 days  -- Discharge Concerns: Need to establish a safety plan; Medication compliance and effectiveness  -- Discharge Goals: Return home with outpatient referrals for mental health follow-up including medication management/psychotherapy  Comer Locket, MD, FAPA 05/23/2020, 12:20 PM

## 2020-05-23 NOTE — [facility] Group Notes (Signed)
Adult Psychoeducational Group Note  Date:  05/23/2020 Time:  4:55 PM  Group Topic/Focus:  Making Healthy Choices:   The focus of this group is to help patients identify negative/unhealthy choices they were using prior to admission and identify positive/healthier coping strategies to replace them upon discharge.  Participation Level:  Active  Participation Quality:  Appropriate and Attentive  Affect:  Appropriate and Flat  Cognitive:  Appropriate  Insight: Appropriate and Good  Engagement in Group:  Engaged  Modes of Intervention:  Discussion  Additional Comments:  Rn led a group focused on the importance medication compliance and symptom management. Pt attended afternoon group.     Deforest Hoyles Nallely Yost 05/23/2020, 4:55 PM

## 2020-05-23 NOTE — Progress Notes (Signed)
Pt A & O X3. Denies SI, HI, AVH and pain when assessed. Concrete, cautious and superficial on interactions. Observed to be anxious, restless, pacing hall at times with inappropriate smiles but without behavioral outburst. Stayed in room majority of this shift. Out of bed for just afternoon group in dayroom but declined three oacknowledged medication noncompliance as result of this admission while doing his EKG "I had stopped taking my medications because I felt better. My family said I was acting abrupt so they brought me here". Declined PRN Vistaril when offered earlier "I'm fine right now". Emotional support, encouragement and reassurance provided to pt throughout this shift as needed. PRN Vistaril offered by Clinical research associate for anxiety but pt declined. Q 15 minutes safety checks and fall precaution maintained without incident thus far.  Pt receptive to care. Tolerates meals and medications well. Denies discomfort and concerns at this time. Remains safe on and off unit.

## 2020-05-23 NOTE — Progress Notes (Signed)
[facility] Group Notes:  (Nursing/MHT/Case Management/Adjunct)  Date:  05/23/2020  Time:  11:18 PM  Type of Therapy:  wrap up  Participation Level:  Active  Participation Quality:  Appropriate  Affect:  Appropriate  Cognitive:  Appropriate  Insight:  Improving  Engagement in Group:  Improving  Modes of Intervention:  Discussion  Summary of Progress/Problems: Pt stated his goal was to D/C within the next 2 days.  Jacques Navy A 05/23/2020, 11:18 PM

## 2020-05-23 NOTE — Progress Notes (Addendum)
   05/23/20 2211  Psych Admission Type (Psych Patients Only)  Admission Status Voluntary  Psychosocial Assessment  Patient Complaints None  Eye Contact Fair  Facial Expression Anxious;Pensive  Affect Appropriate to circumstance  Speech Logical/coherent  Interaction Minimal;Guarded;Forwards little  Motor Activity Other (Comment) (wnl)  Appearance/Hygiene Unremarkable  Behavior Characteristics Cooperative  Mood Anxious;Pleasant  Thought Process  Coherency Blocking  Content WDL  Delusions Paranoid  Perception Hallucinations  Hallucination None reported or observed  Judgment Limited  Confusion UTA  Danger to Self  Current suicidal ideation? Denies  Danger to Others  Danger to Others None reported or observed   Pt denies SI, HI, AVH and pain. Pt rates anxiety 5/10 without a specified source. Pt guarded but pleasant. Pt asked for Ensure to be ordered for him but he has not lost a significant amount of weight and states that he has a good appetite.

## 2020-05-23 NOTE — [facility] Group Notes (Signed)
Adult Psychoeducational Group Note  Date:  05/23/2020 Time:  10:24 AM  Group Topic/Focus:  Goals Group:   The focus of this group is to help patients establish daily goals to achieve during treatment and discuss how the patient can incorporate goal setting into their daily lives to aide in recovery.  Participation Level:  Did Not Attend  Deforest Hoyles [facility] 05/23/2020, 10:24 AM

## 2020-05-23 NOTE — [facility] Group Notes (Signed)
[facility] LCSW Group Therapy  05/23/2020 2:10 PM  Type of Therapy:  Group Therapy  Participation Level:  Did Not Attend   Summary of Progress/Problems: This patient declined to attend group.  Moani Weipert A Kyzer Blowe 05/23/2020, 2:10 PM

## 2020-05-23 NOTE — Progress Notes (Signed)
Recreation Therapy Notes  Date: 12.22.21 Time: 1000 Location: 500 Hall Dayroom  Group Topic: Arts and Crafts  Goal Area(s) Addresses:  Patient will participate in the creative process to complete all crafts.  Patient will interact pro-socially with staff and peers.  Patient will follow directions on the 1st prompt.  Intervention: Arts and Crafts- printed paper templates, safety scissors, glue, glitter, red/green/white construction paper, markers, colored pencils and chalk  Activity: LRT facilitated a therapeutic art activity to encourage self-expression and creativity in recognition of the approaching holidays. Patients had the options to cut out christmas trees, santa hat, tree ornaments, candy canes or free style and Christmas design they could come up with.  Patients were to then decorate their cut outs with the supplies provided.  Education: Socialization, Leisure Education  Education Outcome: Acknowledges understanding  Clinical Observations/Feedback: Pt did not attend group.    Caroll Rancher, LRT/CTRS         Lillia Abed, Tiara Bartoli A 05/23/2020 1:20 PM

## 2020-05-24 LAB — HIV ANTIBODY (ROUTINE TESTING W REFLEX): HIV Screen 4th Generation wRfx: NONREACTIVE

## 2020-05-24 LAB — C-REACTIVE PROTEIN: CRP: 1.3 mg/dL — ABNORMAL HIGH (ref <1.0)

## 2020-05-24 LAB — SEDIMENTATION RATE: Sed Rate: 5 mm/hr (ref 0–16)

## 2020-05-24 LAB — PROLACTIN: Prolactin: 24.1 ng/mL — ABNORMAL HIGH (ref 4.0–15.2)

## 2020-05-24 NOTE — Progress Notes (Addendum)
   05/24/20 2116  Psych Admission Type (Psych Patients Only)  Admission Status Voluntary  Psychosocial Assessment  Patient Complaints Other (Comment)  Eye Contact Fair  Facial Expression Anxious;Pensive  Affect Appropriate to circumstance  Speech Logical/coherent  Interaction Minimal;Guarded;Forwards little  Motor Activity Other (Comment) (wnl)  Appearance/Hygiene Unremarkable  Behavior Characteristics Cooperative  Mood Anxious  Thought Process  Coherency Concrete thinking  Content WDL  Delusions Paranoid  Perception Hallucinations  Hallucination None reported or observed  Judgment Limited  Confusion None  Danger to Self  Current suicidal ideation? Denies  Danger to Others  Danger to Others None reported or observed   Pt had labs drawn this evening. Pt cooperative and pleasant. Pt denies SI, HI, AVH and pain. Pt endorses anxiety 2/10 but says it is decreased from earlier.

## 2020-05-24 NOTE — [facility] Group Notes (Signed)
The focus of this group is to help patients establish daily goals to achieve during treatment and discuss how the patient can incorporate goal setting into their daily lives to aide in recovery.   Patient did not attend group. 

## 2020-05-24 NOTE — Progress Notes (Addendum)
[facility]  05/24/2020 7:46 AM Billy Rose  MRN:  [id number]   Subjective:  Billy Rose is a 19 y.o. 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 He was initially admitted for inpatient psychiatric hospitalization on 05/21/2020 for management of disorganized and bizarre behaviors. The patient is currently on Hospital Day 3.   Chart Review from last 24 hours:  The patient's chart was reviewed and nursing notes were reviewed. The patient's case was discussed in multidisciplinary team meeting. Per nursing notes he remains guarded and superficial in his interactions and has remained paranoid. He did come out of his room some yesterday and attended some groups. He has been pacing in halls and anxious appearing but has not had behavioral outbursts. [facility] with [facility] medications and has received no PRNs other than Trazodone for sleep.  Information Obtained Today During Patient Interview: The patient was seen and evaluated in his room. On assessment today the patient states he is feeling more awake and alert. He reports that his mood is improved and when asked to describe further states, "the hallucinations are gone." When asked to describe recent hallucinations he reports that he was "hearing and seeing things" before admission but when asked to clarify states he "does not recall" the content of his AVH. He denies ideas of reference or first rank symptoms and denies SI or HI. He is vague about events that predate his admission and is evasive when asked to give details about his behaviors and psychotic symptoms that led to admission. He states he has not showered today but has put on casual clothes. He reports attending groups and reports stable sleep and appetite. He voices no  physical complaints.He specifically denies issues with galactorrhea. When asked to clarify his drug use history prior to admission he states his LSD use has been in remission 2 years but he is not sure when he last used THC.   Principal Problem: Schizophrenia spectrum disorder with psychotic disorder type not yet determined (HCC) Diagnosis: Principal Problem:   Schizophrenia spectrum disorder with psychotic disorder type not yet determined (HCC)  Total Time Spent in Direct Patient Care:  I personally spent 25 minutes on the unit in direct patient care. The direct patient care time included face-to-face time with the patient, reviewing the patient's chart, communicating with other professionals, and coordinating care. Greater than 50% of this time was spent in counseling or coordinating care with the patient regarding goals of hospitalization, psycho-education, and discharge planning needs.  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 He was admitted here in 3/21, 7/20, 4/20, 3/20, and 9/18.  Past Medical History:  Past Medical History:  Diagnosis Date  . Headache   . MDD (major depressive disorder), recurrent, severe, with psychosis (HCC)   . 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 Psychosis (HCC)    History reviewed. No pertinent surgical history. Family History:  Family History  Problem Relation Age of Onset  . Allergic rhinitis Neg Hx   . Asthma Neg Hx   . Eczema Neg Hx   . Urticaria Neg Hx    Family Psychiatric  History: (  per admission H&P) unknown  Social History:  Social History   Substance and Sexual Activity  Alcohol Use Not Currently   Comment: "can't remember my last drink"     Social History   Substance and Sexual Activity  Drug Use Yes  . Types: Cocaine,  LSD, Marijuana   Comment: reports not using pot in 2 weeks and 1 tab LSD 1 mth ago    Additional Social History:    Pain Medications: see MAR Prescriptions: see MAR Over the Counter: see MAR History of alcohol / drug use?: Yes Longest period of sobriety (when/how long): 6 months Negative Consequences of Use: Financial,Personal relationships Withdrawal Symptoms:  (Denies) Name of Substance 1: THC 1 - Age of First Use: teens 1 - Amount (size/oz): varies 1 - Frequency: UTA 1 - Duration: UTA 1 - Last Use / Amount: June 2021  Sleep: Good per patient report - slept 7.75 hours per nursing  Appetite:  Good  Current Medications: Current Facility-Administered Medications  Medication Dose Route Frequency Provider Last Rate Last Admin  . acetaminophen (TYLENOL) tablet 650 mg  650 mg Oral Q6H PRN Aldean Baker, NP      . alum & mag hydroxide-simeth (MAALOX/MYLANTA) 200-200-20 MG/5ML suspension 30 mL  30 mL Oral Q4H PRN Aldean Baker, NP      . hydrOXYzine (ATARAX/VISTARIL) tablet 25 mg  25 mg Oral TID PRN Aldean Baker, NP      . magnesium hydroxide (MILK OF MAGNESIA) suspension 30 mL  30 mL Oral Daily PRN Aldean Baker, NP      . OLANZapine zydis (ZYPREXA) disintegrating tablet 15 mg  15 mg Oral QHS Comer Locket, MD   15 mg at 05/23/20 2131  . OLANZapine zydis (ZYPREXA) disintegrating tablet 5 mg  5 mg Oral Q8H PRN Aldean Baker, NP       And  . ziprasidone (GEODON) injection 20 mg  20 mg Intramuscular PRN Aldean Baker, NP      . traZODone (DESYREL) tablet 50 mg  50 mg Oral QHS PRN,MR X 1 Aldean Baker, NP   50 mg at 05/23/20 2131   Lab Results:  Results for orders placed or performed during the hospital encounter of 05/21/20 (from the past 48 hour(s))  Comprehensive metabolic panel     Status: Abnormal   Collection Time: 05/22/20  6:20 PM  Result Value Ref Range   Sodium 141 135 - 145 mmol/L   Potassium 3.9 3.5 - 5.1 mmol/L   Chloride 102 98 - 111 mmol/L   CO2 27 22 - 32  mmol/L   Glucose, Bld 112 (H) 70 - 99 mg/dL    Comment: Glucose reference range applies only to samples taken after fasting for at least 8 hours.   BUN 19 6 - 20 mg/dL   Creatinine, Ser 7.61 0.61 - 1.24 mg/dL   Calcium 9.4 8.9 - 60.7 mg/dL   Total Protein 7.5 6.5 - 8.1 g/dL   Albumin 4.6 3.5 - 5.0 g/dL   AST 23 15 - 41 U/L   ALT 30 0 - 44 U/L   Alkaline Phosphatase 63 38 - 126 U/L   Total Bilirubin 0.3 0.3 - 1.2 mg/dL   GFR, Estimated >37 >10 mL/min    Comment: (NOTE) Calculated using the CKD-EPI Creatinine Equation (2021)    Anion gap 12 5 - 15    Comment: Performed at [facility] [address] [place], [place] [address]  Ethanol  Status: None   Collection Time: 05/22/20  6:20 PM  Result Value Ref Range   Alcohol, Ethyl (B) <10 <10 mg/dL    Comment: (NOTE) Lowest detectable limit for serum alcohol is 10 mg/dL.  For medical purposes only. Performed at [facility], [address] [place], [place] [address]   Hemoglobin A1c     Status: None   Collection Time: 05/22/20  6:20 PM  Result Value Ref Range   Hgb A1c MFr Bld 4.9 4.8 - 5.6 %    Comment: (NOTE) Pre diabetes:          5.7%-6.4%  Diabetes:              >6.4%  Glycemic control for   <7.0% adults with diabetes    Mean Plasma Glucose 93.93 mg/dL    Comment: Performed at [facility], [address] [place], [place] [address]  Lipid panel     Status: Abnormal   Collection Time: 05/22/20  6:20 PM  Result Value Ref Range   Cholesterol 143 0 - 200 mg/dL   Triglycerides 629 (H) <150 mg/dL   HDL 68 >52 mg/dL   Total CHOL/HDL Ratio 2.1 RATIO   VLDL 33 0 - 40 mg/dL   LDL Cholesterol 42 0 - 99 mg/dL    Comment:        Total Cholesterol/HDL:CHD Risk Coronary Heart Disease Risk Table                     Men   Women  1/2 Average Risk   3.4   3.3  Average Risk       5.0   4.4  2 X Average Risk   9.6   7.1  3 X Average Risk  23.4   11.0        Use the calculated  Patient Ratio above and the CHD Risk Table to determine the patient's CHD Risk.        ATP III CLASSIFICATION (LDL):  <100     mg/dL   Optimal  841-324  mg/dL   Near or Above                    Optimal  130-159  mg/dL   Borderline  401-027  mg/dL   High  >253     mg/dL   Very High Performed at [facility], [address] [place], [place] [address]   Prolactin     Status: Abnormal   Collection Time: 05/22/20  6:20 PM  Result Value Ref Range   Prolactin 24.1 (H) 4.0 - 15.2 ng/mL    Comment: (NOTE) Performed At: [facility] [address] [place], [place] [id number] Jolene Schimke MD [id number]   TSH     Status: None   Collection Time: 05/22/20  6:20 PM  Result Value Ref Range   TSH 0.644 0.350 - 4.500 uIU/mL    Comment: Performed by a 3rd Generation assay with a functional sensitivity of <=0.01 uIU/mL. Performed at [facility], [address], [place], [place] [address]     Blood Alcohol level:  Lab Results  Component Value Date   [facility] <10 05/22/2020   ETH <10 08/05/2019    Metabolic Disorder Labs: Lab Results  Component Value Date   HGBA1C 4.9 05/22/2020   MPG 93.93 05/22/2020   MPG 93.93 08/05/2019   Lab Results  Component Value Date   PROLACTIN 24.1 (H) 05/22/2020  PROLACTIN 32.1 (H) 02/15/2017   Lab Results  Component Value Date   CHOL 143 05/22/2020   TRIG 164 (H) 05/22/2020   HDL 68 05/22/2020   CHOLHDL 2.1 05/22/2020   VLDL 33 05/22/2020   LDLCALC 42 05/22/2020   LDLCALC 59 08/05/2019   Physical Findings: AIMS: Facial and Oral Movements Muscles of Facial Expression: None, normal Lips and Perioral Area: None, normal Jaw: None, normal Tongue: None, normal,Extremity Movements Upper (arms, wrists, hands, fingers): None, normal Lower (legs, knees, ankles, toes): None, normal, Trunk Movements Neck, shoulders, hips: None, normal, Overall Severity Severity of abnormal movements (highest score from  questions above): None, normal Incapacitation due to abnormal movements: None, normal Patient's awareness of abnormal movements (rate only patient's report): No Awareness, Dental Status Current problems with teeth and/or dentures?: No Does patient usually wear dentures?: No   Musculoskeletal: Strength & Muscle Tone: within normal limits Gait & Station: normal, steady Patient leans: N/A  Psychiatric Specialty Exam: Physical Exam HENT:     Head: Normocephalic.  Pulmonary:     Effort: Pulmonary effort is normal.  Neurological:     Mental Status: He is alert.     Review of Systems  Respiratory: Negative for shortness of breath.   Cardiovascular: Negative for chest pain.  Gastrointestinal: Negative for nausea and vomiting.  Neurological: Negative for headaches.    Blood pressure 124/74, pulse 82, temperature 98.4 F (36.9 C), temperature source Oral, resp. rate 16, height 5\' 9"  (1.753 m), weight 55 kg, SpO2 100 %.Body mass index is 17.91 kg/m.  General Appearance: casually dressed with fair hygiene, appears stated age  Eye Contact:  Fair   Speech:  Speaks in short 1-2 word phrases to direct questions  Volume:  Normal  Mood: aloof  Affect: brighter affect with less restriction  Thought Process:  Concrete  Orientation:  Oriented to self, city, year and month  Thought Content:  Denies current AVH or command AH; denies ideas of reference or first rank symptoms, less paranoid appearing on exam; no obsessions or compulsions  Suicidal Thoughts:  No  Homicidal Thoughts:  No  Memory:  Would not cooperate for remote testing; recent recall of events poor  Judgement:  Fair - taking meds  Insight:  Lacking  Psychomotor Activity:  Normal  Concentration:  Concentration and attention span improving  Recall:  would not cooperate for testing  Fund of Knowledge:  Fair  Language:  Good  Akathisia:  No  Assets:  Desire for Improvement Resilience Social Support  ADL's: Fair  Cognition:  Improving  Sleep:  Number of Hours: 7.75   Treatment Plan Summary: ASSESSMENT: Billy Rose is a 19 y.o. 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 He was initially admitted for inpatient psychiatric hospitalization on 05/21/2020 for management of disorganized and bizarre behaviors. He appears to be slowly clearing with medication changes.  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 Safety and Monitoring:  -- Voluntary admission to inpatient psychiatric unit for safety, stabilization and treatment  -- Daily contact with patient to  assess and evaluate symptoms and progress in treatment  -- Patient's case to be discussed in multi-disciplinary team meeting  -- Observation Level : q15 minute checks  -- Vital signs:  q12 hours  -- Precautions: suicide  2. Psychiatric Diagnoses and Treatment:   Unspecified schizophrenia spectrum and other psychotic d/o (r/o MDD with psychotic features, r/o schizoaffective d/o, r/o schizophreniform d/o or schizophrenia, r/o substance related psychotic d/o, r/o psychosis related to a general medical condition) -- Continue Zyprexa 15mg  qhs - patient made aware of elevation in prolactin level but consents to continued antipsychotic use - he was counseled on risk of galactorrhea or prolactin increase with antipsychotic use -- Organic labs to w/u psychosis: RPR, heavy metal screen, HIV, ceruloplasmin, ANA, Sed rate, CRP pending ( UDS  negative, TSH 0.644 and WBC 7.6 on admission and Head CT noncontrast in March 2020 negative)  -- Metabolic profile and EKG monitoring obtained while on an atypical antipsychotic (BMI: 17.91 Lipid Panel: Total cholesterol 143, HDL 68, LDL 42, triglycerides 164; HbgA1c: 4.9 QTc:428)   -- Encouraged patient to participate in unit milieu and in scheduled group therapies   -- Attempting to get more collateral from family with his signed consent  -- Short Term Goals: Ability to identify changes in lifestyle to reduce recurrence of condition will improve, Ability to verbalize feelings will improve, Ability to demonstrate self-control will improve, Ability to identify and develop effective coping behaviors will improve, Compliance with prescribed medications will improve and Ability to identify triggers associated with substance abuse/mental health issues will improve  -- Long Term Goals: Improvement in symptoms so as ready for discharge   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 Medical Issues:  Slightly elevated Prolactin Level (24.1) --Prolactin level 24.1 - patient currently asymptomatic so will continue to monitor with antipsychotic use - patient made aware of this lab finding and risks of prolactin increase with antipsychotic use - he agrees to monitor for galactorrhea  -- Consider MRI and monitor Prolactin level with continued antipsychotic use  4. Discharge Planning:   -- Social work and case management to assist with discharge planning and identification of hospital  follow-up needs prior to discharge  -- Estimated LOS:TBD  -- Discharge Concerns: Need to establish a safety plan; Medication compliance and effectiveness  -- Discharge Goals: Return home with outpatient referrals for mental health follow-up including medication management/psychotherapy  April 2020, MD, FAPA 05/24/2020, 7:46 AM

## 2020-05-24 NOTE — [facility] Group Notes (Signed)
Occupational Therapy Group Note Date: 05/24/2020 Group Topic/Focus: Relaxation  Group Description: Group encouraged increased engagement and participation through a guided imagery mindfulness practice to promote relaxation and symptom management. Patients were encouraged to share their own relaxation strategies/practices and were then encouraged to follow along with the guided imagery script provided. Patients reported benefit and feeling more relaxed post group.   Therapeutic Goal(s): Identify personal relaxation strategies and techniques to reduce stress and emotional distress Participation Level: Active   Participation Quality: Independent   Behavior: Calm and Cooperative   Speech/Thought Process: Focused   Affect/Mood: Euthymic   Insight: Fair   Judgement: Fair   Individualization: Billy Rose was active in their participation of group activity, appeared engaged in guided imagery script; observed with eyes closed, breathing, quiet/no interruptions. Pt identified feeling calm and relaxed post activity and identified "playing six string" as an additional strategy or activity they could engage in to promote relaxation.   Modes of Intervention: Activity, Education and Support  Patient Response to Interventions:  Attentive and Engaged   Plan: Continue to engage patient in OT groups 2 - 3x/week.  05/24/2020  Donne Hazel, MOT, OTR/L

## 2020-05-24 NOTE — [facility] Suicide Risk Assessment (Signed)
[facility] INPATIENT:  Family/Significant Other Suicide Prevention Education  Suicide Prevention Education: Education Completed; mother, Dusty Raczkowski [phone]), has been identified by the patient as the family member/significant other with whom the patient will be residing, and identified as the person(s) who will aid the patient in the event of a mental health crisis (suicidal ideations/suicide attempt).  With written consent from the patient, the family member/significant other has been provided the following suicide prevention education, prior to the and/or following the discharge of the patient.  The suicide prevention education provided includes the following:  Suicide risk factors  Suicide prevention and interventions  National Suicide Hotline telephone number  [facility] assessment telephone number  [facility] Emergency Assistance 911  [facility] and/or Residential Mobile Crisis Unit telephone number   Request made of family/significant other to:  Remove weapons (e.g., guns, rifles, knives), all items previously/currently identified as safety concern.    Remove drugs/medications (over-the-counter, prescriptions, illicit drugs), all items previously/currently identified as a safety concern.   The family member/significant other verbalizes understanding of the suicide prevention education information provided.  The family member/significant other agrees to remove the items of safety concern listed above.  CSW spoke with this patients mother who reported that this patient has struggled with mental health issues since the age of 19y.o. Per mother patient used LSD at 19, which led to psychosis. She stated that he becomes symptomatic every 6-9 months. She stated that he tends to isolate and become non-verbal, however with this recent episode he also began to have odd body movements and behaviors (pointing at nothing, staring at random things intensely).   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 She stated that she pays for his utilities and he pays the rent. She said on Monday his symptoms became exaggerated and he told his mother that he needed to go to [facility], which he was able to come on his own.   Per mother this patient struggles to stay on medications due to the side effects and causing him to feel "dull." She feels that he needs help with socializing and going out more. Mother was interested in CST or ACTT services. CSW will discuss these services with patient.   Ruthann Cancer MSW, LCSW Clincal Social Worker  [facility]

## 2020-05-25 DIAGNOSIS — F29 Unspecified psychosis not due to a substance or known physiological condition: Principal | ICD-10-CM

## 2020-05-25 LAB — RPR: RPR Ser Ql: NONREACTIVE

## 2020-05-25 LAB — CERULOPLASMIN: Ceruloplasmin: 22.5 mg/dL (ref 16.0–31.0)

## 2020-05-25 NOTE — Progress Notes (Signed)
°   05/25/20 1500  Psych Admission Type (Psych Patients Only)  Admission Status Voluntary  Psychosocial Assessment  Patient Complaints Isolation  Eye Contact Fair  Facial Expression Anxious;Pensive  Affect Appropriate to circumstance  Speech Logical/coherent  Interaction Minimal;Guarded;Forwards little  Motor Activity Other (Comment) (wnl)  Appearance/Hygiene Unremarkable  Behavior Characteristics Cooperative  Mood Preoccupied  Thought Process  Coherency Concrete thinking  Content WDL  Delusions Paranoid  Perception Hallucinations  Hallucination None reported or observed  Judgment Limited  Confusion None  Danger to Self  Current suicidal ideation? Denies  Danger to Others  Danger to Others None reported or observed

## 2020-05-25 NOTE — Progress Notes (Signed)
[facility] MD Progress Note  05/25/2020 1:12 PM Von Quintanar  MRN:  [id number] Subjective: Patient is a 19 year old male admitted to the behavioral health hospital on a voluntary basis on 05/21/2020 after hearing auditory hallucinations and being depressed.  Conversation with his mother led [place] to believe that he had abruptly stopped any medication he had been prescribed at home.  Objective: Patient is seen and examined.  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  It noted in the evaluation that the patient was disorganized and bizarre on admission.  On 12/23 the patient was noted to be guarded and superficial in his interactions and remained paranoid.  He was compliant with medications.  He also stated on 12/23 that the hallucinations were gone.  And that he was not thinking about hurting himself.  The patient denied any side effects to medications today.  He slept 7.75 hours.  Review of nursing notes reveal compliance with medications, and otherwise normal evaluations per their viewpoint.  His vital signs are stable, he is afebrile.  Review of his admission laboratories revealed a mildly elevated glucose at 112, but otherwise normal electrolytes including creatinine and liver function enzymes.  His lipid panel was normal.  Ceruloplasmin was 22.5 which was within normal limits.  His CRP was 1.3 which was within normal limits.  CBC was normal.  Differential was normal.  His prolactin was 24.1.  Sed rate was 5.  Hemoglobin A1c was 4.9.  His TSH was 0.644.  RPR was nonreactive.  Blood alcohol on admission was less than 10, drug screen was negative.  Principal Problem: Schizophrenia spectrum disorder with psychotic disorder type not yet determined (HCC) Diagnosis: Principal Problem:   Schizophrenia spectrum disorder with psychotic disorder type not yet determined (HCC)  Total Time spent with  patient: 20 minutes  Past Psychiatric History: See admission H&P  Past Medical History:  Past Medical History:  Diagnosis Date  . Headache   . MDD (major depressive disorder), recurrent, severe, with psychosis (HCC)   . 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 Psychosis (HCC)    History reviewed. No pertinent surgical history. Family History:  Family History  Problem Relation Age of Onset  . Allergic rhinitis Neg Hx   . Asthma Neg Hx   . Eczema Neg Hx   . Urticaria Neg Hx    Family Psychiatric  History: See admission H&P Social History:  Social History   Substance and Sexual Activity  Alcohol Use Not Currently   Comment: "can't remember my last drink"     Social History   Substance and Sexual Activity  Drug Use Yes  . Types: Cocaine, LSD, Marijuana   Comment: reports not using pot in 2 weeks and 1 tab LSD 1 mth ago    Social History   Socioeconomic History  . Marital status: Single    Spouse name: Not on file  . Number of children: Not on file  . Years of education: Not on file  . Highest education level: Not on file  Occupational History  . Not on file  Tobacco Use  . Smoking status: Former Games developer  . Smokeless tobacco: Never Used  Vaping Use  . Vaping Use: Never used  Substance and Sexual Activity  . Alcohol use: Not Currently    Comment: "can't remember my last drink"  . Drug use: Yes    Types: Cocaine, LSD, Marijuana  Comment: reports not using pot in 2 weeks and 1 tab LSD 1 mth ago  . Sexual activity: Not Currently  Other Topics Concern  . Not on file  Social History Narrative   ** Merged History Encounter **       Social Determinants of Health   Financial Resource Strain: Not on file  Food Insecurity: Not on file  Transportation Needs: Not on file  Physical Activity: Not on file  Stress: Not on file  Social Connections: Not on file   Additional Social History:    Pain Medications: see MAR Prescriptions: see  MAR Over the Counter: see MAR History of alcohol / drug use?: Yes Longest period of sobriety (when/how long): 6 months Negative Consequences of Use: Financial,Personal relationships Withdrawal Symptoms:  (Denies) Name of Substance 1: THC 1 - Age of First Use: teens 1 - Amount (size/oz): varies 1 - Frequency: UTA 1 - Duration: UTA 1 - Last Use / Amount: June 2021                  Sleep: Good  Appetite:  Good  Current Medications: Current Facility-Administered Medications  Medication Dose Route Frequency Provider Last Rate Last Admin  . acetaminophen (TYLENOL) tablet 650 mg  650 mg Oral Q6H PRN Aldean Baker, NP      . alum & mag hydroxide-simeth (MAALOX/MYLANTA) 200-200-20 MG/5ML suspension 30 mL  30 mL Oral Q4H PRN Aldean Baker, NP      . hydrOXYzine (ATARAX/VISTARIL) tablet 25 mg  25 mg Oral TID PRN Aldean Baker, NP   25 mg at 05/24/20 1734  . magnesium hydroxide (MILK OF MAGNESIA) suspension 30 mL  30 mL Oral Daily PRN Aldean Baker, NP      . OLANZapine zydis (ZYPREXA) disintegrating tablet 15 mg  15 mg Oral QHS Comer Locket, MD   15 mg at 05/24/20 2043  . OLANZapine zydis (ZYPREXA) disintegrating tablet 5 mg  5 mg Oral Q8H PRN Aldean Baker, NP       And  . ziprasidone (GEODON) injection 20 mg  20 mg Intramuscular PRN Aldean Baker, NP      . traZODone (DESYREL) tablet 50 mg  50 mg Oral QHS PRN,MR X 1 Aldean Baker, NP   50 mg at 05/24/20 2043    Lab Results:  Results for orders placed or performed during the hospital encounter of 05/21/20 (from the past 48 hour(s))  Ceruloplasmin     Status: None   Collection Time: 05/24/20  7:24 PM  Result Value Ref Range   Ceruloplasmin 22.5 16.0 - 31.0 mg/dL    Comment: (NOTE) Performed At: [facility] [address] [place], [place] [id number] Jolene Schimke MD SN:[id number]   C-reactive protein     Status: Abnormal   Collection Time: 05/24/20  7:24 PM  Result Value Ref Range   CRP 1.3 (H) <1.0 mg/dL     Comment: Performed at [facility], [address], [place], [place] [address]  Sedimentation rate     Status: None   Collection Time: 05/24/20  7:24 PM  Result Value Ref Range   Sed Rate 5 0 - 16 mm/hr    Comment: Performed at [facility], [address] [place], [place] [address]  HIV Antibody (routine testing w rflx)     Status: None   Collection Time: 05/24/20  7:24 PM  Result Value Ref Range   HIV Screen 4th Generation wRfx Non  Reactive Non Reactive    Comment: Performed at [facility], [address] [place], [place] [address]  RPR     Status: None   Collection Time: 05/24/20  7:24 PM  Result Value Ref Range   RPR Ser Ql NON REACTIVE NON REACTIVE    Comment: Performed at [facility], [address] [place], [place] [address]    Blood Alcohol level:  Lab Results  Component Value Date   [facility] <10 05/22/2020   ETH <10 08/05/2019    Metabolic Disorder Labs: Lab Results  Component Value Date   HGBA1C 4.9 05/22/2020   MPG 93.93 05/22/2020   MPG 93.93 08/05/2019   Lab Results  Component Value Date   PROLACTIN 24.1 (H) 05/22/2020   PROLACTIN 32.1 (H) 02/15/2017   Lab Results  Component Value Date   CHOL 143 05/22/2020   TRIG 164 (H) 05/22/2020   HDL 68 05/22/2020   CHOLHDL 2.1 05/22/2020   VLDL 33 05/22/2020   LDLCALC 42 05/22/2020   LDLCALC 59 08/05/2019    Physical Findings: AIMS: Facial and Oral Movements Muscles of Facial Expression: None, normal Lips and Perioral Area: None, normal Jaw: None, normal Tongue: None, normal,Extremity Movements Upper (arms, wrists, hands, fingers): None, normal Lower (legs, knees, ankles, toes): None, normal, Trunk Movements Neck, shoulders, hips: None, normal, Overall Severity Severity of abnormal movements (highest score from questions above): None, normal Incapacitation due to abnormal movements: None, normal Patient's awareness of abnormal movements (rate only  patient's report): No Awareness, Dental Status Current problems with teeth and/or dentures?: No Does patient usually wear dentures?: No  CIWA:    COWS:     Musculoskeletal: Strength & Muscle Tone: within normal limits Gait & Station: normal Patient leans: N/A  Psychiatric Specialty Exam: Physical Exam Vitals and nursing note reviewed.  Constitutional:      Appearance: Normal appearance.  HENT:     Head: Normocephalic and atraumatic.  Pulmonary:     Effort: Pulmonary effort is normal.  Neurological:     General: No focal deficit present.     Mental Status: He is alert and oriented to person, place, and time.     Review of Systems  Blood pressure 124/74, pulse 82, temperature 98.4 F (36.9 C), temperature source Oral, resp. rate 16, height 5\' 9"  (1.753 m), weight 55 kg, SpO2 100 %.Body mass index is 17.91 kg/m.  General Appearance: Casual  Eye Contact:  Fair  Speech:  Normal Rate  Volume:  Normal  Mood:  Euthymic  Affect:  Congruent  Thought Process:  Coherent and Descriptions of Associations: Circumstantial  Orientation:  Full (Time, Place, and Person)  Thought Content:  Logical  Suicidal Thoughts:  No  Homicidal Thoughts:  No  Memory:  Immediate;   Fair Recent;   Fair Remote;   Fair  Judgement:  Intact  Insight:  Lacking  Psychomotor Activity:  Normal  Concentration:  Concentration: Fair and Attention Span: Fair  Recall:  F[employer]Fair  Fund of Knowledge:  Fair  Language:  Good  Akathisia:  Negative  Handed:  Right  AIMS (if indicated):     Assets:  Desire for Improvement Resilience  ADL's:  Intact  Cognition:  WNL  Sleep:  Number of Hours: 7.75     Treatment Plan Summary: Daily contact with patient to assess and evaluate symptoms and progress in treatment, Medication management and Plan : Patient is seen and examined.  Patient is a 19 year old male with the above-stated past psychiatric  history who is seen in follow-up.   Diagnosis: 1.  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  Pertinent findings on examination today: 1.  Patient denied auditory or visual hallucinations. 2.  Patient denies suicidal or homicidal ideation. 3.  No evidence of acute paranoia. 4.  Information from mother today found that the patient had apparently abruptly stopped his medications prior to admission. 5.  Metabolic work-up for outside causes of psychosis are all negative at this point.  Plan: 1.  Continue hydroxyzine 25 mg p.o. 3 times daily as needed anxiety. 2.  Reorder EKG secondary to antipsychotic medications. 3.  Continue Zyprexa 15 mg p.o. nightly for psychosis and mood stability. 4.  Continue Zyprexa agitation protocol as needed. 5.  Continue trazodone 50 mg p.o. nightly as needed insomnia. 6.  I have asked the patient to contact his mother so that she can help [place] evaluate whether he is near his baseline. 7.  If everything goes well we will hope to discharge in 1 to 2 days.  Antonieta Pert, MD 05/25/2020, 1:12 PM

## 2020-05-26 MED ORDER — OLANZAPINE 15 MG PO TABS: 15.0000 mg | ORAL_TABLET | Freq: Every day | ORAL | 0 refills | Status: DC

## 2020-05-26 MED ORDER — OLANZAPINE 7.5 MG PO TABS
15.0000 mg | ORAL_TABLET | Freq: Every day | ORAL | Status: DC
  Filled 2020-05-26: qty 14

## 2020-05-26 NOTE — Progress Notes (Signed)
  [facility] Adult Case Management Discharge Plan :  Will you be returning to the same living situation after discharge:  Yes,  apartment alone At discharge, do you have transportation home?: Yes,  states his car is in the parking lot and the keys are in his locker Do you have the ability to pay for your medications: Yes,  Medicaid  Release of information consent forms completed and emailed to Medical Records, then turned in to Medical Records by CSW.   Patient to Follow up at:  Follow-up Information    [facility]. Go on 05/29/2020.   Specialty: Behavioral Health Why: You have a walk in appointment for therapy services on 05/29/20 at 7:45 am. You also have a walk in appointment for medication management on 06/20/20 at 7:45 am. Walk in appointments are first come, first served and are held in person. Contact information: [phone] [place] [address] [phone]              Next level of care provider has access to [facility] Link:yes  Safety Planning and Suicide Prevention discussed: Yes,  with mother  Have you used any form of tobacco in the last 30 days? (Cigarettes, Smokeless Tobacco, Cigars, and/or Pipes): No  Has patient been referred to the Quitline?: N/A patient is not a smoker  Patient has been referred for addiction treatment: N/A  [name], LCSW 05/26/2020, 9:46 AM

## 2020-05-26 NOTE — Progress Notes (Signed)
   05/26/20 0900  Psych Admission Type (Psych Patients Only)  Admission Status Voluntary  Psychosocial Assessment  Patient Complaints None  Eye Contact Fair  Facial Expression Anxious;Pensive  Affect Appropriate to circumstance  Speech Logical/coherent  Interaction Guarded;Forwards little  Motor Activity Other (Comment) (wnl)  Appearance/Hygiene Unremarkable  Behavior Characteristics Cooperative;Calm  Mood Euthymic  Thought Process  Coherency Concrete thinking  Content WDL  Delusions None reported or observed  Perception WDL  Hallucination None reported or observed  Judgment Limited  Confusion None  Danger to Self  Current suicidal ideation? Denies  Danger to Others  Danger to Others None reported or observed

## 2020-05-26 NOTE — Progress Notes (Signed)
Pt discharged to lobby. Pt was stable and appreciative at that time. All papers and samples were given and valuables returned. Verbal understanding expressed. Denies SI/HI and A/VH. Pt given opportunity to express concerns and ask questions. 

## 2020-05-26 NOTE — Progress Notes (Signed)
Patient denies SI, HI and AVH this shift.  Patient was compliant with medications attended groups and had no incidents of behavioral dyscontrol.    Assess patient for safety, offer medications as prescribed, engage patient in 1:1 staff talks.    Continue to monitor as planned.  Patient able to to contract for safety.

## 2020-05-26 NOTE — Progress Notes (Signed)
[facility] NOVEL CORONAVIRUS (COVID-19) DAILY CHECK-OFF SYMPTOMS - answer yes or no to each - every day NO YES  Have you had a fever in the past 24 hours?  . Fever (Temp > 37.80C / 100F) X   Have you had any of these symptoms in the past 24 hours? . New Cough .  Sore Throat  .  Shortness of Breath .  Difficulty Breathing .  Unexplained Body Aches   X   Have you had any one of these symptoms in the past 24 hours not related to allergies?   . Runny Nose .  Nasal Congestion .  Sneezing   X   If you have had runny nose, nasal congestion, sneezing in the past 24 hours, has it worsened?  X   EXPOSURES - check yes or no X   Have you traveled outside the state in the past 14 days?  X   Have you been in contact with someone with a confirmed diagnosis of COVID-19 or PUI in the past 14 days without wearing appropriate PPE?  X   Have you been living in the same home as a person with confirmed diagnosis of COVID-19 or a PUI (household contact)?    X   Have you been diagnosed with COVID-19?    X              What to do next: Answered NO to all: Answered YES to anything:   Proceed with unit schedule Follow the [facility] Inpatient Flowsheet.   

## 2020-05-26 NOTE — Discharge Summary (Signed)
Physician Discharge Summary Note  Patient:  Billy Rose is an 19 y.o., male MRN:  [id number] DOB:  2000/06/06 Patient phone:  [phone] (home)  Patient address:   [address] [place] [address],  Total Time spent with patient: 15 minutes  Date of Admission:  05/21/2020 Date of Discharge: 05/26/20  Reason for Admission:  psychosis  Principal Problem: Schizophrenia spectrum disorder with psychotic disorder type not yet determined [facility]) Discharge Diagnoses: Principal Problem:   Schizophrenia spectrum disorder with psychotic disorder type not yet determined [facility])   Past Psychiatric History: MDD, recurrent, severe, Substance induces psychosis, cannabis use disorder  Past Medical History:  Past Medical History:  Diagnosis Date  . Headache   . MDD (major depressive disorder), recurrent, severe, with psychosis (HCC)   . 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 Psychosis (HCC)    History reviewed. No pertinent surgical history. Family History:  Family History  Problem Relation Age of Onset  . Allergic rhinitis Neg Hx   . Asthma Neg Hx   . Eczema Neg Hx   . Urticaria Neg Hx    Family Psychiatric  History: Unknown Social History:  Social History   Substance and Sexual Activity  Alcohol Use Not Currently   Comment: "can't remember my last drink"     Social History   Substance and Sexual Activity  Drug Use Yes  . Types: Cocaine, LSD, Marijuana   Comment: reports not using pot in 2 weeks and 1 tab LSD 1 mth ago    Social History   Socioeconomic History  . Marital status: Single    Spouse name: Not on file  . Number of children: Not on file  . Years of education: Not on file  . Highest education level: Not on file  Occupational History  . Not on file  Tobacco Use  . Smoking status: Former Games developer  . Smokeless tobacco: Never Used  Vaping Use  . Vaping Use: Never used  Substance and Sexual Activity  . Alcohol use: Not Currently     Comment: "can't remember my last drink"  . Drug use: Yes    Types: Cocaine, LSD, Marijuana    Comment: reports not using pot in 2 weeks and 1 tab LSD 1 mth ago  . Sexual activity: Not Currently  Other Topics Concern  . Not on file  Social History Narrative   ** Merged History Encounter **       Social Determinants of Health   Financial Resource Strain: Not on file  Food Insecurity: Not on file  Transportation Needs: Not on file  Physical Activity: Not on file  Stress: Not on file  Social Connections: Not on file    Hospital Course:  From admission H&P: Billy Rose is an 19 y.o. male. He presents with bizarre, inappropriate affect and is a poor historian. He states he came to the hospital because "My friends and family say I am not right in the head" but unable to explain further. He states he has heard voices for a "long time" but unable to say what the voices are saying. He denies drug use. Denies SI. He reports HI with no plan/intent or target. He has multiple superficial cuts on his left arm and when asked about this, admits to cutting himself but unable to say when he did this or explain intent. He provides consent for collateral information from his mother Iyad Deroo [phone]. Ms. Payette states the patient has been acting bizarre  over the last two weeks. He has been pointing at things that are not there and responding to internal stimuli. She reports he has remote history of LSD use as well as marijuana use but does not believe he has used any substances in the last six months. 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 He agreed to come to [facility] after a conversation with his mother. She denies him showing any aggressive or suicidal behaviors in the last six months. He is agreeable to inpatient hospitalization.  Mr. Clausing was admitted for bizarre behaviors. UDS negative. He remained on the [facility] unit for five  days. He was restarted on Zyprexa. He was minimal and vague when describing symptoms and reasons for hospitalization. He participated in group therapy on the unit. He responded well to treatment with no adverse effects reported. He has shown improved mood, affect, sleep, and interaction. No agitated or disruptive behaviors. He reports AVH have resolved with medication. He denies any SI/HI. He is discharging on the medications listed below. Patient was discussed at treatment team and staff agrees he has returned to baseline mental status. He agrees to follow up at [facility] (see below). Patient is provided with prescriptions for medications upon discharge. He is discharging home via personal transportation.  Physical Findings: AIMS: Facial and Oral Movements Muscles of Facial Expression: None, normal Lips and Perioral Area: None, normal Jaw: None, normal Tongue: None, normal,Extremity Movements Upper (arms, wrists, hands, fingers): None, normal Lower (legs, knees, ankles, toes): None, normal, Trunk Movements Neck, shoulders, hips: None, normal, Overall Severity Severity of abnormal movements (highest score from questions above): None, normal Incapacitation due to abnormal movements: None, normal Patient's awareness of abnormal movements (rate only patient's report): No Awareness, Dental Status Current problems with teeth and/or dentures?: No Does patient usually wear dentures?: No  CIWA:    COWS:     Musculoskeletal: Strength & Muscle Tone: within normal limits Gait & Station: normal Patient leans: N/A  Psychiatric Specialty Exam: Physical Exam Vitals and nursing note reviewed.  Constitutional:      Appearance: He is well-developed and well-nourished.  Cardiovascular:     Rate and Rhythm: Normal rate.  Pulmonary:     Effort: Pulmonary effort is normal.  Neurological:     Mental Status: He is alert and oriented to person, place, and time.     Review of Systems   Constitutional: Negative.   Respiratory: Negative for cough and shortness of breath.   Psychiatric/Behavioral: Negative for agitation, behavioral problems, confusion, decreased concentration, dysphoric mood, hallucinations, self-injury, sleep disturbance and suicidal ideas. The patient is not nervous/anxious and is not hyperactive.     Blood pressure 101/70, pulse (!) 120, temperature 97.7 F (36.5 C), temperature source Oral, resp. rate 16, height 5\' 9"  (1.753 m), weight 55 kg, SpO2 100 %.Body mass index is 17.91 kg/m.  See MD's discharge SRA     Have you used any form of tobacco in the last 30 days? (Cigarettes, Smokeless Tobacco, Cigars, and/or Pipes): No  Has this patient used any form of tobacco in the last 30 days? (Cigarettes, Smokeless Tobacco, Cigars, and/or Pipes)  No  Blood Alcohol level:  Lab Results  Component Value Date   [facility] <10 05/22/2020   ETH <10 08/05/2019    Metabolic Disorder Labs:  Lab Results  Component Value Date   HGBA1C 4.9 05/22/2020   MPG 93.93 05/22/2020   MPG 93.93 08/05/2019  Lab Results  Component Value Date   PROLACTIN 24.1 (H) 05/22/2020   PROLACTIN 32.1 (H) 02/15/2017   Lab Results  Component Value Date   CHOL 143 05/22/2020   TRIG 164 (H) 05/22/2020   HDL 68 05/22/2020   CHOLHDL 2.1 05/22/2020   VLDL 33 05/22/2020   LDLCALC 42 05/22/2020   LDLCALC 59 08/05/2019    See Psychiatric Specialty Exam and Suicide Risk Assessment completed by Attending Physician prior to discharge.  Discharge destination:  Home  Is patient on multiple antipsychotic therapies at discharge:  No   Has Patient had three or more failed trials of antipsychotic monotherapy by history:  No  Recommended Plan for Multiple Antipsychotic Therapies: NA  Discharge Instructions    Diet - low sodium heart healthy   Complete by: As directed    Increase activity slowly   Complete by: As directed      Allergies as of 05/26/2020      Reactions   Penicillins  Other (See Comments), Anaphylaxis, Swelling   Reaction not recalled, but he IS allergic Did it involve swelling of the face/tongue/throat, SOB, or low BP? Unk Did it involve sudden or severe rash/hives, skin peeling, or any reaction on the inside of your mouth or nose? Unk Did you need to seek medical attention at a hospital or doctor's office? Unk When did it last happen? Childhood If all above answers are "NO", may proceed with cephalosporin use. Reaction not recalled, but he IS allergic Did it involve swelling of the face/tongue/throat, SOB, or low BP? Unk Did it involve sudden or severe rash/hives, skin peeling, or any reaction on the inside of your mouth or nose? Unk Did you need to seek medical attention at a hospital or doctor's office? Unk When did it last happen? Childhood If all above answers are "NO", may proceed with cephalosporin use.   Sulfa Antibiotics Anaphylaxis, Shortness Of Breath, Swelling, Rash   Throat swells   Prozac [fluoxetine Hcl] Other (See Comments)   Makes mean   Bactrim [sulfamethoxazole-trimethoprim] Rash      Medication List    TAKE these medications     Indication  OLANZapine 15 MG tablet Commonly known as: ZYPREXA Take 1 tablet (15 mg total) by mouth at bedtime.  Indication: psychosis       Follow-up Information    [facility]. Go on 05/29/2020.   Specialty: Behavioral Health Why: You have a walk in appointment for therapy services on 05/29/20 at 7:45 am. You also have a walk in appointment for medication management on 06/20/20 at 7:45 am. Walk in appointments are first come, first served and are held in person. Contact information: [address] [place] [address] [phone]              Follow-up recommendations: Activity as tolerated. Diet as recommended by primary care physician. Keep all scheduled follow-up appointments as recommended.   Comments:   Patient is instructed to take all  prescribed medications as recommended. Report any side effects or adverse reactions to your outpatient psychiatrist. Patient is instructed to abstain from alcohol and illegal drugs while on prescription medications. In the event of worsening symptoms, patient is instructed to call the crisis hotline, 911, or go to the nearest emergency department for evaluation and treatment.  Signed: Aldean Baker, NP 05/26/2020, 9:47 AM

## 2020-05-26 NOTE — BHH Suicide Risk Assessment (Signed)
[facility] Discharge Suicide Risk Assessment   Principal Problem: Schizophrenia spectrum disorder with psychotic disorder type not yet determined [facility]) Discharge Diagnoses: Principal Problem:   Schizophrenia spectrum disorder with psychotic disorder type not yet determined (HCC)   Total Time spent with patient: 20 minutes  Musculoskeletal: Strength & Muscle Tone: within normal limits Gait & Station: normal Patient leans: N/A  Psychiatric Specialty Exam: Review of Systems  All other systems reviewed and are negative.   Blood pressure 101/70, pulse (!) 120, temperature 97.7 F (36.5 C), temperature source Oral, resp. rate 16, height 5\' 9"  (1.753 m), weight 55 kg, SpO2 100 %.Body mass index is 17.91 kg/m.  General Appearance: Casual  Eye Contact::  Fair  Speech:  Normal Rate409  Volume:  Normal  Mood:  Euthymic  Affect:  Congruent  Thought Process:  Coherent and Descriptions of Associations: Intact  Orientation:  Full (Time, Place, and Person)  Thought Content:  Logical  Suicidal Thoughts:  No  Homicidal Thoughts:  No  Memory:  Immediate;   Fair Recent;   Fair Remote;   Fair  Judgement:  Intact  Insight:  Lacking  Psychomotor Activity:  Normal  Concentration:  Good  Recall:  Fair  Fund of Knowledge:Good  Language: Good  Akathisia:  Negative  Handed:  Right  AIMS (if indicated):     Assets:  Desire for Improvement Housing Resilience Social Support  Sleep:  Number of Hours: 7.25  Cognition: WNL  ADL's:  Intact   Mental Status Per Nursing Assessment::   On Admission:  NA  Demographic Factors:  Male, Adolescent or young adult and Unemployed  Loss Factors: NA  Historical Factors: Impulsivity  Risk Reduction Factors:   Living with another person, especially a relative and Positive social support  Continued Clinical Symptoms:  Schizophrenia:   Less than 20 years old Paranoid or undifferentiated type  Cognitive Features That Contribute To Risk:  Thought  constriction (tunnel vision)    Suicide Risk:  Minimal: No identifiable suicidal ideation.  Patients presenting with no risk factors but with morbid ruminations; may be classified as minimal risk based on the severity of the depressive symptoms   Follow-up Information    [facility]. Go on 05/29/2020.   Specialty: Behavioral Health Why: You have a walk in appointment for therapy services on 05/29/20 at 7:45 am. You also have a walk in appointment for medication management on 06/20/20 at 7:45 am. Walk in appointments are first come, first served and are held in person. Contact information: [address] [place] [phone]              Plan Of Care/Follow-up recommendations:  Activity:  ad lib  [phone], MD 05/26/2020, 7:57 AM

## 2020-05-27 LAB — ANA W/REFLEX IF POSITIVE: Anti Nuclear Antibody (ANA): NEGATIVE

## 2020-05-28 LAB — HEAVY METALS, BLOOD
Arsenic: <1 ug/L — ABNORMAL LOW (ref 2–23)
Lead: 4 ug/dL (ref 0–4)
Mercury: <1 ug/L (ref 0.0–14.9)

## 2020-10-08 ENCOUNTER — Encounter (HOSPITAL_COMMUNITY): Payer: Self-pay | Admitting: Emergency Medicine

## 2020-10-08 ENCOUNTER — Emergency Department (HOSPITAL_COMMUNITY)
Admission: EM | Admit: 2020-10-08 | Discharge: 2020-10-09 | Disposition: A | Discharge status: Other Acute Inpatient Hospital | Payer: Medicaid Other | Source: Home / Self Care | Attending: Emergency Medicine | Admitting: Emergency Medicine

## 2020-10-08 ENCOUNTER — Other Ambulatory Visit: Payer: Self-pay

## 2020-10-08 ENCOUNTER — Ambulatory Visit (HOSPITAL_COMMUNITY)
Admission: AD | Admit: 2020-10-08 | Discharge: 2020-10-08 | Disposition: A | Discharge status: Home / Self Care | Payer: Medicaid Other | Attending: Psychiatry | Admitting: Psychiatry

## 2020-10-08 DIAGNOSIS — T50902A Poisoning by unspecified drugs, medicaments and biological substances, intentional self-harm, initial encounter: Secondary | ICD-10-CM | POA: Insufficient documentation

## 2020-10-08 DIAGNOSIS — T1491XA Suicide attempt, initial encounter: Secondary | ICD-10-CM

## 2020-10-08 DIAGNOSIS — Z20822 Contact with and (suspected) exposure to covid-19: Secondary | ICD-10-CM | POA: Insufficient documentation

## 2020-10-08 DIAGNOSIS — Z87891 Personal history of nicotine dependence: Secondary | ICD-10-CM | POA: Insufficient documentation

## 2020-10-08 DIAGNOSIS — X838XXA Intentional self-harm by other specified means, initial encounter: Secondary | ICD-10-CM

## 2020-10-08 DIAGNOSIS — S50812A Abrasion of left forearm, initial encounter: Secondary | ICD-10-CM | POA: Insufficient documentation

## 2020-10-08 DIAGNOSIS — X789XXA Intentional self-harm by unspecified sharp object, initial encounter: Secondary | ICD-10-CM | POA: Insufficient documentation

## 2020-10-08 LAB — COMPREHENSIVE METABOLIC PANEL
ALT: 27 U/L (ref 0–44)
AST: 28 U/L (ref 15–41)
Albumin: 5 g/dL (ref 3.5–5.0)
Alkaline Phosphatase: 73 U/L (ref 38–126)
Anion gap: 7 (ref 5–15)
BUN: 17 mg/dL (ref 6–20)
CO2: 26 mmol/L (ref 22–32)
Calcium: 10 mg/dL (ref 8.9–10.3)
Chloride: 106 mmol/L (ref 98–111)
Creatinine, Ser: 0.76 mg/dL (ref 0.61–1.24)
GFR, Estimated: >60 mL/min (ref >60)
Glucose, Bld: 90 mg/dL (ref 70–99)
Potassium: 4.1 mmol/L (ref 3.5–5.1)
Sodium: 139 mmol/L (ref 135–145)
Total Bilirubin: 0.2 mg/dL — ABNORMAL LOW (ref 0.3–1.2)
Total Protein: 7.9 g/dL (ref 6.5–8.1)

## 2020-10-08 LAB — ACETAMINOPHEN LEVEL: Acetaminophen (Tylenol), Serum: <10 ug/mL — ABNORMAL LOW (ref 10–30)

## 2020-10-08 LAB — CBC
HCT: 47.7 % (ref 39.0–52.0)
Hemoglobin: 16.2 g/dL (ref 13.0–17.0)
MCH: 30.8 pg (ref 26.0–34.0)
MCHC: 34 g/dL (ref 30.0–36.0)
MCV: 90.7 fL (ref 80.0–100.0)
Platelets: 247 10*3/uL (ref 150–400)
RBC: 5.26 MIL/uL (ref 4.22–5.81)
RDW: 12.6 % (ref 11.5–15.5)
WBC: 6.8 10*3/uL (ref 4.0–10.5)
nRBC: 0 % (ref 0.0–0.2)

## 2020-10-08 LAB — RAPID URINE DRUG SCREEN, HOSP PERFORMED
Amphetamines: NOT DETECTED
Barbiturates: NOT DETECTED
Benzodiazepines: NOT DETECTED
Cocaine: NOT DETECTED
Opiates: NOT DETECTED
Tetrahydrocannabinol: NOT DETECTED

## 2020-10-08 LAB — SALICYLATE LEVEL: Salicylate Lvl: <7 mg/dL — ABNORMAL LOW (ref 7.0–30.0)

## 2020-10-08 LAB — ETHANOL: Alcohol, Ethyl (B): <10 mg/dL (ref <10)

## 2020-10-08 LAB — CBG MONITORING, ED: Glucose-Capillary: 88 mg/dL (ref 70–99)

## 2020-10-08 MED ORDER — IBUPROFEN 200 MG PO TABS: 200.0000 mg | ORAL_TABLET | Freq: Four times a day (QID) | ORAL | Status: DC | PRN

## 2020-10-08 MED ORDER — NICOTINE 21 MG/24HR TD PT24
21.0000 mg | MEDICATED_PATCH | Freq: Every day | TRANSDERMAL | Status: DC
  Administered 2020-10-08: 21 mg via TRANSDERMAL
  Filled 2020-10-08: qty 1

## 2020-10-08 MED ORDER — ESCITALOPRAM OXALATE 10 MG PO TABS
10.0000 mg | ORAL_TABLET | Freq: Every day | ORAL | Status: DC
  Administered 2020-10-08: 10 mg via ORAL
  Filled 2020-10-08: qty 1

## 2020-10-08 MED ORDER — ARIPIPRAZOLE 10 MG PO TABS
10.0000 mg | ORAL_TABLET | Freq: Every day | ORAL | Status: DC
  Administered 2020-10-08: 10 mg via ORAL
  Filled 2020-10-08: qty 1

## 2020-10-08 MED ORDER — LORAZEPAM 0.5 MG PO TABS
0.5000 mg | ORAL_TABLET | Freq: Three times a day (TID) | ORAL | Status: DC | PRN
  Administered 2020-10-08: 0.5 mg via ORAL
  Filled 2020-10-08: qty 1

## 2020-10-08 NOTE — BH Assessment (Signed)
Clinician to call TTS cart and complete pt's assessment at 2330, discussed with Annie Sable, RN.    Redmond Pulling, MS, [facility], [name] Triage Specialist [phone]

## 2020-10-08 NOTE — ED Provider Notes (Signed)
  Physical Exam  BP 126/84   Pulse 71   Temp 98.5 F (36.9 C) (Oral)   Resp 16   SpO2 98%   Physical Exam  ED Course/Procedures   Clinical Course as of 10/08/20 1756  Mon Oct 08, 2020  1312 Case reported to poison control who recommended observation and symptomatic care at this time.  Greatest toxicity of ingested agents is sedation. [EW]    Clinical Course User Index [EW] Mancel Bale, MD    Procedures  MDM  Care assumed at 4 PM.  Patient here for suicidal ideation.  Chemistry was pending.  Was able to follow-up with them chemistry and was normal.  TTS consult was ordered.  Patient is medically cleared for psych eval.    Charlynne Pander, MD 10/08/20 [phone]

## 2020-10-08 NOTE — ED Notes (Signed)
Pt placed in hospital gown. Sitter at bedside. Belongings placed above RN stations at [employer]

## 2020-10-08 NOTE — ED Triage Notes (Signed)
Pt presented to [facility] and was transported here by [facility] after allegedly taking 54 abilify 4 days ago. Also has superficial cut to L wrist from that time. Alert, oriented and cooperative. 4/10 headache.
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
## 2020-10-08 NOTE — H&P (Addendum)
Behavioral Health Medical Screening Exam  Billy Rose is a 20 y.o. male patient presented to [facility] as a walk in  accompanied by his mother  with complaints of "I overdosed on pills 2 days ago".  Billy Rose, 20 y.o., male patient seen face to face by this provider, consulted with Dr. Lucianne Muss; and chart reviewed on 10/08/20  During evaluation Billy Rose is in sitting position. His skin is pale and clammy. Pupils are dilated. He is dishelved. He is alert, oriented x 4, anxious and cooperative. Attention is fair. His mood is depressed with congruent affect.  He has normal speech, and behavior.  Objectively there is no evidence of psychosis/mania or delusional thinking.  Patient is able to converse coherently, goal directed thoughts, no distractibility, or pre-occupation. Reports he self harm/cuts for coping but 2 days ago cut his wrist in an attempt to end life, in addition to taking the Ability and Lamictal. Denies suicidal ideation at this time. Denies psychosis, and paranoia.  Patient answered question appropriately.    Patient reports that 2 days ago he wanted to end his life he took 27 - 25 mg Lamictal tablets, 60-5 mg Abilify tablets.  He also self harms by cutting and has a cut marks across to his left wrist.  Reports he was going to slice his wrist open in an attempt to kill himself. States he passed out after taking pills and cutting wrist. States his mom found him this am and brought him [facility].   Follows up with Dr. Sallyanne Havers at [facility].  Reports 1 week ago Dr. Sallyanne Havers discontinued his Zyprexa and Lexapro.  Reports history of MDD, PTSD and past inpatient hospitalization.  Denies any illegal substance or alcohol use.    Total Time spent with patient: 15 minutes  Psychiatric Specialty Exam:  Presentation  General Appearance: Disheveled  Eye Contact:Fleeting  Speech:Clear and Coherent  Speech Volume:Normal  Handedness:Right   Mood and Affect  Mood:Depressed;  Hopeless  Affect:Congruent   Thought Process  Thought Processes:Coherent  Descriptions of Associations:Intact  Orientation:Full (Time, Place and Person)  Thought Content:Logical  History of Schizophrenia/Schizoaffective disorder:Yes  Duration of Psychotic Symptoms:Less than six months  Hallucinations:Hallucinations: None  Ideas of Reference:None  Suicidal Thoughts:Suicidal Thoughts: Yes, Active SI Active Intent and/or Plan: With Intent; With Plan; With Means to Carry Out  Homicidal Thoughts:Homicidal Thoughts: No   Sensorium  Memory:Immediate Good; Recent Good; Remote Good  Judgment:Poor  Insight:Poor   Executive Functions  Concentration:Fair  Attention Span:Fair  Recall:Fair  Fund of Knowledge:Good  Language:Good   Psychomotor Activity  Psychomotor Activity:Psychomotor Activity: Normal   Assets  Assets:Communication Skills; Desire for Improvement; Financial Resources/Insurance; Housing; Physical Health; Transportation; Vocational/Educational   Sleep  Sleep:Sleep: Poor    Physical Exam: Physical Exam Vitals reviewed.  HENT:     Head: Normocephalic.     Right Ear: Tympanic membrane normal.     Left Ear: Tympanic membrane normal.     Nose: Nose normal.     Mouth/Throat:     Mouth: Mucous membranes are dry.     Pharynx: Oropharynx is clear.  Eyes:     Conjunctiva/sclera: Conjunctivae normal.  Cardiovascular:     Rate and Rhythm: Normal rate.     Pulses: Normal pulses.  Pulmonary:     Effort: Pulmonary effort is normal.  Abdominal:     Tenderness: There is no guarding.  Musculoskeletal:        General: Normal range of motion.     Cervical back: Normal range  of motion.  Skin:    General: Skin is warm.     Capillary Refill: Capillary refill takes less than 2 seconds.     Coloration: Skin is pale.     Findings: Lesion (L inner forarma and wrist has cuts) present.  Neurological:     Mental Status: He is alert and oriented to person,  place, and time.  Psychiatric:        Attention and Perception: Attention normal.        Mood and Affect: Mood is depressed.        Speech: Speech normal.        Behavior: Behavior is cooperative.        Thought Content: Thought content includes suicidal ideation.        Cognition and Memory: Cognition normal.        Judgment: Judgment is impulsive.    Review of Systems  Constitutional: Negative.   HENT: Negative.   Eyes: Negative.   Respiratory: Negative.   Cardiovascular: Negative.   Gastrointestinal: Negative.   Genitourinary: Negative.   Musculoskeletal: Negative.   Skin: Negative.   Neurological: Negative.   Endo/Heme/Allergies: Negative.   Psychiatric/Behavioral: Positive for depression and suicidal ideas.   Blood pressure 139/84, pulse 99, temperature 98.4 F (36.9 C), temperature source Oral, resp. rate 18, SpO2 100 %. There is no height or weight on file to calculate BMI.  Musculoskeletal: Strength & Muscle Tone: within normal limits Gait & Station: normal Patient leans: N/A   Recommendations:  Based on my evaluation the patient appears to have an emergency medical condition for which I recommend the patient be transferred to the emergency department for further evaluation.  Ardis Hughs, NP 10/08/2020, 11:29 AM

## 2020-10-09 ENCOUNTER — Other Ambulatory Visit: Payer: Self-pay

## 2020-10-09 ENCOUNTER — Encounter (HOSPITAL_COMMUNITY): Payer: Self-pay | Admitting: Psychiatry

## 2020-10-09 ENCOUNTER — Inpatient Hospital Stay (HOSPITAL_COMMUNITY)
Admission: AD | Admit: 2020-10-09 | Discharge: 2020-10-14 | DRG: 885 | Disposition: A | Discharge status: Home / Self Care | Payer: Medicaid Other | Source: Intra-hospital | Attending: Behavioral Health | Admitting: Behavioral Health

## 2020-10-09 DIAGNOSIS — F333 Major depressive disorder, recurrent, severe with psychotic symptoms: Principal | ICD-10-CM | POA: Diagnosis present

## 2020-10-09 DIAGNOSIS — X838XXA Intentional self-harm by other specified means, initial encounter: Secondary | ICD-10-CM

## 2020-10-09 DIAGNOSIS — F259 Schizoaffective disorder, unspecified: Secondary | ICD-10-CM | POA: Diagnosis present

## 2020-10-09 DIAGNOSIS — T50902A Poisoning by unspecified drugs, medicaments and biological substances, intentional self-harm, initial encounter: Secondary | ICD-10-CM | POA: Diagnosis present

## 2020-10-09 DIAGNOSIS — Z87891 Personal history of nicotine dependence: Secondary | ICD-10-CM

## 2020-10-09 DIAGNOSIS — F1410 Cocaine abuse, uncomplicated: Secondary | ICD-10-CM | POA: Diagnosis present

## 2020-10-09 DIAGNOSIS — F319 Bipolar disorder, unspecified: Secondary | ICD-10-CM | POA: Insufficient documentation

## 2020-10-09 DIAGNOSIS — F19159 Other psychoactive substance abuse with psychoactive substance-induced psychotic disorder, unspecified: Secondary | ICD-10-CM | POA: Diagnosis present

## 2020-10-09 DIAGNOSIS — Z880 Allergy status to penicillin: Secondary | ICD-10-CM

## 2020-10-09 DIAGNOSIS — F411 Generalized anxiety disorder: Secondary | ICD-10-CM | POA: Diagnosis present

## 2020-10-09 DIAGNOSIS — Z20822 Contact with and (suspected) exposure to covid-19: Secondary | ICD-10-CM | POA: Diagnosis present

## 2020-10-09 DIAGNOSIS — F1210 Cannabis abuse, uncomplicated: Secondary | ICD-10-CM | POA: Diagnosis present

## 2020-10-09 DIAGNOSIS — F322 Major depressive disorder, single episode, severe without psychotic features: Secondary | ICD-10-CM | POA: Diagnosis present

## 2020-10-09 DIAGNOSIS — Z882 Allergy status to sulfonamides status: Secondary | ICD-10-CM

## 2020-10-09 DIAGNOSIS — R519 Headache, unspecified: Secondary | ICD-10-CM | POA: Diagnosis present

## 2020-10-09 DIAGNOSIS — Z888 Allergy status to other drugs, medicaments and biological substances status: Secondary | ICD-10-CM | POA: Diagnosis not present

## 2020-10-09 LAB — SARS CORONAVIRUS 2 (TAT 6-24 HRS): SARS Coronavirus 2: NEGATIVE

## 2020-10-09 MED ORDER — MAGNESIUM HYDROXIDE 400 MG/5ML PO SUSP: 30.0000 mL | Freq: Every day | ORAL | Status: DC | PRN

## 2020-10-09 MED ORDER — LORAZEPAM 1 MG PO TABS
1.0000 mg | ORAL_TABLET | Freq: Once | ORAL | Status: AC
  Administered 2020-10-09: 1 mg via ORAL
  Filled 2020-10-09: qty 1

## 2020-10-09 MED ORDER — HYDROXYZINE HCL 25 MG PO TABS
25.0000 mg | ORAL_TABLET | Freq: Three times a day (TID) | ORAL | Status: DC | PRN
  Administered 2020-10-09: 25 mg via ORAL
  Filled 2020-10-09: qty 1

## 2020-10-09 MED ORDER — TRAZODONE HCL 50 MG PO TABS
50.0000 mg | ORAL_TABLET | Freq: Every evening | ORAL | Status: DC | PRN
  Administered 2020-10-09 – 2020-10-13 (×5): 50 mg via ORAL
  Filled 2020-10-09 (×5): qty 1

## 2020-10-09 MED ORDER — ARIPIPRAZOLE 10 MG PO TABS
10.0000 mg | ORAL_TABLET | Freq: Every day | ORAL | Status: DC
  Administered 2020-10-09 – 2020-10-14 (×6): 10 mg via ORAL
  Filled 2020-10-09 (×10): qty 1

## 2020-10-09 MED ORDER — NICOTINE POLACRILEX 2 MG MT GUM
2.0000 mg | CHEWING_GUM | OROMUCOSAL | Status: DC | PRN
  Administered 2020-10-09 – 2020-10-14 (×21): 2 mg via ORAL
  Filled 2020-10-09 (×7): qty 1
  Filled 2020-10-09: qty 10
  Filled 2020-10-09 (×9): qty 1

## 2020-10-09 MED ORDER — ACETAMINOPHEN 325 MG PO TABS: 650.0000 mg | ORAL_TABLET | Freq: Four times a day (QID) | ORAL | Status: DC | PRN

## 2020-10-09 MED ORDER — NICOTINE POLACRILEX 2 MG MT GUM
CHEWING_GUM | OROMUCOSAL | Status: AC
  Filled 2020-10-09: qty 1

## 2020-10-09 MED ORDER — LORAZEPAM 0.5 MG PO TABS
0.5000 mg | ORAL_TABLET | Freq: Three times a day (TID) | ORAL | Status: DC | PRN
  Administered 2020-10-09 – 2020-10-10 (×3): 0.5 mg via ORAL
  Filled 2020-10-09 (×3): qty 1

## 2020-10-09 MED ORDER — ESCITALOPRAM OXALATE 10 MG PO TABS
10.0000 mg | ORAL_TABLET | Freq: Every day | ORAL | Status: DC
  Administered 2020-10-09 – 2020-10-14 (×6): 10 mg via ORAL
  Filled 2020-10-09 (×9): qty 1

## 2020-10-09 MED ORDER — ALUM & MAG HYDROXIDE-SIMETH 200-200-20 MG/5ML PO SUSP: 30.0000 mL | ORAL | Status: DC | PRN

## 2020-10-09 NOTE — ED Notes (Signed)
Report given to [facility] at [facility].  Pt to be discharged and transported to [facility] to room 401-1 if covid test results come back negative.  Will call safe transport pending covid results if negative.
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
## 2020-10-09 NOTE — BH Assessment (Addendum)
Per [name], RN, patient has been accepted to [facility] and assigned to room/bed: 404-1. Pt can arrive ASAP. Attending: Dr.  Clifton Custard. Clinician followed with Annie Sable, RN on pt's updated disposition.   Redmond Pulling, MS, [facility], [facility] Triage Specialist [phone]

## 2020-10-09 NOTE — Progress Notes (Signed)
     10/09/20 2125  Psych Admission Type (Psych Patients Only)  Admission Status Voluntary  Psychosocial Assessment  Patient Complaints None  Eye Contact Fair  Facial Expression Fixed smile  Affect Anxious  Speech Logical/coherent  Interaction Assertive  Motor Activity Restless  Appearance/Hygiene Unremarkable  Behavior Characteristics Cooperative;Appropriate to situation;Anxious  Mood Pleasant  Thought Process  Coherency WDL  Content WDL  Delusions None reported or observed  Perception WDL  Hallucination None reported or observed  Judgment Poor  Confusion None  Danger to Self  Current suicidal ideation? Denies  Danger to Others  Danger to Others None reported or observed

## 2020-10-09 NOTE — Tx Team (Signed)
Initial Treatment Plan 10/09/2020 12:00 PM Keen Wogan AVW:[id number]    PATIENT STRESSORS: Financial difficulties Health problems Marital or family conflict Substance abuse   PATIENT STRENGTHS: Ability for insight Capable of independent living Barrister's clerk for treatment/growth   PATIENT IDENTIFIED PROBLEMS: "Medication management"  "Coping skills"  Suicidal ideation  Depression  Anxiety             DISCHARGE CRITERIA:  Ability to meet basic life and health needs Adequate post-discharge living arrangements Motivation to continue treatment in a less acute level of care  PRELIMINARY DISCHARGE PLAN: Attend aftercare/continuing care group Outpatient therapy Return to previous living arrangement  PATIENT/FAMILY INVOLVEMENT: This treatment plan has been presented to and reviewed with the patient, [name].  The patient and family have been given the opportunity to ask questions and make suggestions.  Audrie Lia Yannely Kintzel, RN 10/09/2020, 12:00 PM

## 2020-10-09 NOTE — Progress Notes (Signed)
    D:  Patient presents with an anxious affect, but a pleasant mood tonight.  Patient interacted with peers.  Patient rated his day as 9/10.  Spoke with patient about his upcoming birthday.  Patient stated, "getting drunk for 20st birthday."  Discouraged patient from doing that.  Patient denies SI/HI and AVH, and verbally contracts for safety.  A:  Labs/ Vitals monitored; Medication education provided; Patient supported emotionally; Patient asked to communicate his needs, concerns, and questions.  R:  Patient remains safe with 15 minute checks; Will continue to monitor.

## 2020-10-09 NOTE — ED Notes (Signed)
Lab stated the 6-24hr covid screen is a send out and the specimen was sent to [place] to be tested.  Spoke with [facility] to make aware, RN aware at [facility] and stated to call [facility] back once results are in, pt's bed will be saved.

## 2020-10-09 NOTE — ED Notes (Signed)
Pt discharged from this ED in stable condition at this time.Discharged to [facility] via [place]. ambulatory with steady gait, clear speech.

## 2020-10-09 NOTE — Progress Notes (Incomplete)
Admission Note: Patient is a 20 years old male admitted to the unit from [facility] for suicidal ideation with self injurious behavior by cutting his left wrist and arm with a razor and also overdosed on his prescribed medications.  States stressors/triggers includes his girlfriend turned him down and dealing with life in general.  States goal is medication management and to learn coping skills.  Verbally contracts for safety while in the hospital.  Reports multiple past suicide attempts.  Patient presents with a blunted affect and anxious mood.  Patient is alert and oriented x 4.  Admission plan of care reviewed and consent for treatment signed.  Skin and personal belongings completed.  Skin is dry with superficial cut to left wrist and arm noted.  No contraband found.  Patient oriented to the unit, staff and room.  Routine safety checks initiated.  Understanding of unit rules/protocols verbalized.  Patient is safe on the unit at this time.

## 2020-10-09 NOTE — ED Notes (Signed)
Spoke with cytology at [facility] lab. Per lab, covid swab will be resulted within 20 mins.

## 2020-10-10 DIAGNOSIS — F319 Bipolar disorder, unspecified: Secondary | ICD-10-CM

## 2020-10-10 LAB — HEMOGLOBIN A1C
Hgb A1c MFr Bld: 5.2 % (ref 4.8–5.6)
Mean Plasma Glucose: 102.54 mg/dL

## 2020-10-10 LAB — LIPID PANEL
Cholesterol: 140 mg/dL (ref 0–200)
HDL: 57 mg/dL (ref >40)
LDL Cholesterol: 52 mg/dL (ref 0–99)
Total CHOL/HDL Ratio: 2.5 RATIO
Triglycerides: 154 mg/dL — ABNORMAL HIGH (ref <150)
VLDL: 31 mg/dL (ref 0–40)

## 2020-10-10 LAB — TSH: TSH: 0.795 u[IU]/mL (ref 0.350–4.500)

## 2020-10-10 MED ORDER — GABAPENTIN 100 MG PO CAPS
100.0000 mg | ORAL_CAPSULE | Freq: Two times a day (BID) | ORAL | Status: DC
  Administered 2020-10-10 – 2020-10-11 (×2): 100 mg via ORAL
  Filled 2020-10-10 (×4): qty 1

## 2020-10-10 MED ORDER — HYDROXYZINE HCL 50 MG PO TABS
50.0000 mg | ORAL_TABLET | Freq: Three times a day (TID) | ORAL | Status: DC | PRN
  Administered 2020-10-10 – 2020-10-13 (×5): 50 mg via ORAL
  Filled 2020-10-10 (×4): qty 1

## 2020-10-10 MED ORDER — GABAPENTIN 100 MG PO CAPS
ORAL_CAPSULE | ORAL | Status: AC
  Filled 2020-10-10: qty 1

## 2020-10-10 MED ORDER — HYDROXYZINE HCL 50 MG PO TABS
ORAL_TABLET | ORAL | Status: AC
  Filled 2020-10-10: qty 1

## 2020-10-10 NOTE — Tx Team (Signed)
Interdisciplinary Treatment and Diagnostic Plan Update  10/10/2020 Time of Session: 9:35am Billy Rose MRN: [id number]  Principal Diagnosis: <principal problem not specified>  Secondary Diagnoses: Active Problems:   Bipolar disorder with psychotic features ([place])   Current Medications:  Current Facility-Administered Medications  Medication Dose Route Frequency Provider Last Rate Last Admin  . acetaminophen (TYLENOL) tablet 650 mg  650 mg Oral Q6H PRN Revonda Humphrey, NP      . alum & mag hydroxide-simeth (MAALOX/MYLANTA) 200-200-20 MG/5ML suspension 30 mL  30 mL Oral Q4H PRN Revonda Humphrey, NP      . ARIPiprazole (ABILIFY) tablet 10 mg  10 mg Oral Daily Revonda Humphrey, NP   10 mg at 10/10/20 0818  . escitalopram (LEXAPRO) tablet 10 mg  10 mg Oral Daily Revonda Humphrey, NP   10 mg at 10/10/20 0818  . hydrOXYzine (ATARAX/VISTARIL) tablet 25 mg  25 mg Oral TID PRN Revonda Humphrey, NP   25 mg at 10/09/20 1443  . LORazepam (ATIVAN) tablet 0.5 mg  0.5 mg Oral Q8H PRN Revonda Humphrey, NP   0.5 mg at 10/10/20 0819  . magnesium hydroxide (MILK OF MAGNESIA) suspension 30 mL  30 mL Oral Daily PRN Revonda Humphrey, NP      . nicotine polacrilex (NICORETTE) gum 2 mg  2 mg Oral PRN Revonda Humphrey, NP   2 mg at 10/09/20 1931  . traZODone (DESYREL) tablet 50 mg  50 mg Oral QHS PRN Revonda Humphrey, NP   50 mg at 10/09/20 2124   PTA Medications: Medications Prior to Admission  Medication Sig Dispense Refill Last Dose  . ARIPiprazole (ABILIFY) 10 MG tablet Take 10 mg by mouth daily.     [name] Kitchen escitalopram (LEXAPRO) 10 MG tablet Take 10 mg by mouth daily.     [name] Kitchen ibuprofen (ADVIL) 200 MG tablet Take 200 mg by mouth every 6 (six) hours as needed for mild pain.     [name] Kitchen LORazepam (ATIVAN) 0.5 MG tablet Take 0.5 mg by mouth every 8 (eight) hours as needed for anxiety.     [name] Kitchen OLANZapine (ZYPREXA) 10 MG tablet Take 10 mg by mouth at bedtime.     [name] Kitchen OLANZapine (ZYPREXA) 15 MG tablet Take 1  tablet (15 mg total) by mouth at bedtime. (Patient not taking: No sig reported) 30 tablet 0     Patient Stressors: Financial difficulties Health problems Marital or family conflict Substance abuse  Patient Strengths: Ability for insight Capable of independent living Armed forces logistics/support/administrative officer Motivation for treatment/growth  Treatment Modalities: Medication Management, Group therapy, Case management,  1 to 1 session with clinician, Psychoeducation, Recreational therapy.   Physician Treatment Plan for Primary Diagnosis: <principal problem not specified> Long Term Goal(s):     Short Term Goals:    Medication Management: Evaluate patient's response, side effects, and tolerance of medication regimen.  Therapeutic Interventions: 1 to 1 sessions, Unit Group sessions and Medication administration.  Evaluation of Outcomes: Not Met  Physician Treatment Plan for Secondary Diagnosis: Active Problems:   Bipolar disorder with psychotic features ([place])  Long Term Goal(s):     Short Term Goals:       Medication Management: Evaluate patient's response, side effects, and tolerance of medication regimen.  Therapeutic Interventions: 1 to 1 sessions, Unit Group sessions and Medication administration.  Evaluation of Outcomes: Not Met   RN Treatment Plan for Primary Diagnosis: <principal problem not specified> Long Term Goal(s): Knowledge of disease and therapeutic regimen to maintain health  will improve  Short Term Goals: Ability to remain free from injury will improve, Ability to verbalize frustration and anger appropriately will improve, Ability to demonstrate self-control, Ability to identify and develop effective coping behaviors will improve and Compliance with prescribed medications will improve  Medication Management: RN will administer medications as ordered by provider, will assess and evaluate patient's response and provide education to patient for prescribed medication. RN will report  any adverse and/or side effects to prescribing provider.  Therapeutic Interventions: 1 on 1 counseling sessions, Psychoeducation, Medication administration, Evaluate responses to treatment, Monitor vital signs and CBGs as ordered, Perform/monitor CIWA, COWS, AIMS and Fall Risk screenings as ordered, Perform wound care treatments as ordered.  Evaluation of Outcomes: Not Met   LCSW Treatment Plan for Primary Diagnosis: <principal problem not specified> Long Term Goal(s): Safe transition to appropriate next level of care at discharge, Engage patient in therapeutic group addressing interpersonal concerns.  Short Term Goals: Engage patient in aftercare planning with referrals and resources, Increase social support, Increase ability to appropriately verbalize feelings, Identify triggers associated with mental health/substance abuse issues and Increase skills for wellness and recovery  Therapeutic Interventions: Assess for all discharge needs, 1 to 1 time with Social worker, Explore available resources and support systems, Assess for adequacy in community support network, Educate family and significant other(s) on suicide prevention, Complete Psychosocial Assessment, Interpersonal group therapy.  Evaluation of Outcomes: Not Met   Progress in Treatment: Attending groups: No. Participating in groups: No. Taking medication as prescribed: Yes. Toleration medication: Yes. Family/Significant other contact made: No, will contact:  if consent is provided Patient understands diagnosis: Yes. Discussing patient identified problems/goals with staff: Yes. Medical problems stabilized or resolved: Yes. Denies suicidal/homicidal ideation: Yes. Issues/concerns per patient self-inventory: No.   New problem(s) identified: No, Describe:  none  New Short Term/Long Term Goal(s): medication stabilization, elimination of SI thoughts, development of comprehensive mental wellness plan.    Patient Goals:  Did not  attend  Discharge Plan or Barriers: Patient recently admitted. CSW will continue to follow and assess for appropriate referrals and possible discharge planning.    Reason for Continuation of Hospitalization: Depression Medication stabilization Suicidal ideation  Estimated Length of Stay: 3-5 days  Attendees: Patient: Did not attend 10/10/2020   Physician:  10/10/2020   Nursing:  10/10/2020   RN Care Manager: 10/10/2020   Social Worker: Darletta Moll, [place] 10/10/2020   Recreational Therapist:  10/10/2020   Other:  10/10/2020   Other:  10/10/2020   Other: 10/10/2020       Scribe for Treatment Team: Vassie Moselle, LCSW 10/10/2020 11:49 AM

## 2020-10-10 NOTE — BHH Suicide Risk Assessment (Addendum)
[facility] Admission Suicide Risk Assessment   Nursing information obtained from:  Patient Demographic factors:  Adolescent or young adult Current Mental Status:  Self-harm behaviors,Self-harm thoughts Loss Factors:  Decline in physical health,Loss of significant relationship Historical Factors:  Impulsivity,Prior suicide attempts Risk Reduction Factors:  Positive social support  Total Time Spent in Direct Patient Care:  I personally spent 40 minutes on the unit in direct patient care. The direct patient care time included face-to-face time with the patient, reviewing the patient's chart, communicating with other professionals, and coordinating care. Greater than 50% of this time was spent in counseling or coordinating care with the patient regarding goals of hospitalization, psycho-education, and discharge planning needs.  Principal Problem: MDD (major depressive disorder), severe (HCC) Diagnosis:  Principal Problem:   MDD (major depressive disorder), severe (HCC) Cluster B traits  Subjective Data: The patient is a 20y/o male with past psychiatric history significant for previous psychosis, polysubstance abuse, and MDD, who was seen as a walk-in at [facility] on 10/08/20 for reported suicide attempt via overdose approximately 2-3 days prior to assessment. He was sent to [facility] for medical screening and was then voluntarily admitted to [facility] for continued stabilization. On assessment he is irritable and minimally cooperative or questioning. He is vague on interview as to what precipitated his recent suicide attempt. He states that he initially overdosed on an unknown amount and type of his psychotropic medications. According to the ED notes, he overdosed on 54 - 25mg  Lamictal tablets and 60 - 5mg  Abilify tablets 4 days prior to admission. He states after the overdose was unsuccessful he starting cutting his wrist and then attempted to walk into traffic. He states he stopped his psychotropic medications 5-6 days ago  because he did not think they were working, and he has been seen recently at [facility] as an outpatient. He denies AVH, paranoia, mania/hypomania, or recent drug or alcohol use. He denies current SI or HI and can contract for safety on the unit.  He would not cooperate for additional questioning regarding recent depressive symptoms or discussion of recent psychosocial stressors. See H&P for additional details.  CLINICAL FACTORS:   Depression:   Impulsivity More than one psychiatric diagnosis Previous Psychiatric Diagnoses and Treatments  Musculoskeletal: Strength & Muscle Tone: within normal limits Gait & Station: steady, normal Patient leans: N/A  Psychiatric Specialty Exam: Physical Exam Vitals reviewed.  HENT:     Head: Normocephalic.  Pulmonary:     Effort: Pulmonary effort is normal.  Skin:    Comments: Superficial cut marks on medial, left, inner forearm   Neurological:     Mental Status: He is alert.     Review of Systems - see H&P  Blood pressure 101/69, pulse (!) 127, temperature 97.9 F (36.6 C), temperature source Oral, resp. rate 18, height 5\' 9"  (1.753 m), weight 55.3 kg, SpO2 98 %.Body mass index is 18.02 kg/m.  General Appearance: Disheveled  Eye Contact:  Minimal  Speech:  Clear and Coherent and Normal Rate  Volume:  Normal  Mood:  Irritable  Affect:  Congruent  Thought Process:  Goal Directed and Linear  Orientation:  Would not cooperate for questioning   Thought Content:  Logical - no evidence of acute psychosis, delusions, or paranoia on exam  Suicidal Thoughts:  suicide attempt prior to admission; denies current SI, intent or plan  Homicidal Thoughts:  No  Memory:  Recent;   Fair  Judgement:  Impaired  Insight:  Lacking  Psychomotor Activity:  Normal  Concentration:  Concentration: Fair  Recall:  [employer] of Knowledge:  Fair  Language:  Good  Akathisia:  Negative  Assets:  Communication Skills Desire for  Improvement Housing Resilience Social Support  ADL's:  Independent  Cognition:  WNL  Sleep:  Number of Hours: 6.5    COGNITIVE FEATURES THAT CONTRIBUTE TO RISK:  Closed-mindedness    SUICIDE RISK:   Moderate:  Frequent suicidal ideation with limited intensity, and duration, some specificity in terms of plans, no associated intent, good self-control, limited dysphoria/symptomatology, some risk factors present, and identifiable protective factors, including available and accessible social support.  PLAN OF CARE: Patient was admitted voluntarily to the unit. Admission labs were reviewed: CMP WNL except for total bili 0.2; ETOH <10; Salicylate <7; Tylenol <10; CBC WNL; UDS negative; SARS negative; will order TSH, A1c, and lipid panel; EKG shows SR 79 bmp with RSR" in V1/V2 probably normal variant and no significant change from previous tracings, QTc . Patient was restarted on Abilify 10mg  daily and Lexapro 10mg  daily.   I certify that inpatient services furnished can reasonably be expected to improve the patient's condition.   , MD, FAPA 10/10/2020, 3:55 PM

## 2020-10-10 NOTE — BHH Group Notes (Addendum)
LCSW Group Therapy Note  Type of Therapy/Topic: Group Therapy: Six Dimensions of Wellness  Participation Level: Active  Description of Group:  This group will address the concept of wellness and the six concepts of wellness: occupational, physical, social, intellectual, spiritual, and emotional. Patients will be encouraged to process areas in their lives that are out of balance and identify reasons for remaining unbalanced. Patients will be encouraged to explore ways to practice healthy habits daily to attain better physical and mental health outcomes.  Therapeutic Goals:  1. Identify aspects of wellness that they are doing well.  2. Identify aspects of wellness that they would like to improve upon.  3. Identify one action they can take to improve an aspect of wellness in their lives.  Summary of Patient Progress: Billy Rose attended group but came into the session late. Marti discussed various ways a person can achieve wellness with his peers and participated in an outside activity with his peers.

## 2020-10-10 NOTE — Progress Notes (Signed)
Psychoeducational Group Note  Date:  10/10/2020 Time:  2313  Group Topic/Focus:  Wrap-Up Group:   The focus of this group is to help patients review their daily goal of treatment and discuss progress on daily workbooks.  Participation Level: Did Not Attend  Participation Quality:  Not Applicable  Affect:  Not Applicable  Cognitive:  Not Applicable  Insight:  Not Applicable  Engagement in Group: Not Applicable  Additional Comments:  The patient did not attend group this evening.   Hazle Coca S 10/10/2020, 11:13 PM

## 2020-10-10 NOTE — BHH Group Notes (Signed)
Pt did not attend group. 

## 2020-10-10 NOTE — Progress Notes (Signed)
   10/10/20 1000  Psych Admission Type (Psych Patients Only)  Admission Status Voluntary  Psychosocial Assessment  Patient Complaints None  Eye Contact Fair  Facial Expression Fixed smile  Affect Anxious  Speech Logical/coherent  Interaction Assertive  Motor Activity Restless  Appearance/Hygiene Unremarkable  Behavior Characteristics Cooperative;Appropriate to situation  Mood Pleasant  Thought Process  Coherency WDL  Content WDL  Delusions None reported or observed  Perception WDL  Hallucination None reported or observed  Judgment WDL  Confusion None  Danger to Self  Current suicidal ideation? Denies  Danger to Others  Danger to Others None reported or observed

## 2020-10-10 NOTE — Progress Notes (Signed)
[facility] MD Progress Note  10/11/2020 12:17 PM Billy Rose  MRN:  [id number]   Chief Complaint: suicide attempt  Subjective:  Billy Rose is a 20 y.o. male with a past psychiatric history significant for MDD, previous psychosis, and substance abuse, who was seen as a walk-in at [facility] on 10/08/20 for reported suicide attempt via overdose, cutting, and attempt to walk into traffic approximately 2-3 days prior to assessment. He was sent to [facility] for medical screening and was then voluntarily admitted to [facility] for continued stabilization. The patient is currently on Hospital Day 2.   Chart Review from last 24 hours:  The patient's chart was reviewed and nursing notes were reviewed. The patient's case was discussed in multidisciplinary team meeting. Per nursing, the patient was ruminative about request for Ativan for anxiety. No safety concerns were noted. He attended 1 group. Per MAR he was compliant with scheduled medications and he did receive Vistaril X2 for anxiety, Ativan X1 for anxiety, and Trazodone X1 for sleep.  Information Obtained Today During Patient Interview: The patient was seen and evaluated on the unit. On assessment today the patient reports that he wants to be discharged. He signed his 72 hour request for leave yesterday afternoon, and it was explained that we have been unable to do any safety planning with family. I advised that since he had 3 reported suicide attempts prior to admission, since he has been isolating in his room, and since he has been minimally participating in his treatment, that he is not deemed to be safe at this time for release. I encouraged him to rescind the request for leave and advised he would be placed under IVC if he cannot comply with treatment. He was encouraged to shower, attend groups, and be more engaged in the milieu. He states he is anxious, and I advised that given his addiction history we will not be prescribing controlled medications. He is ruminative about  request for benzodiazepines. He was advised that we can titrate up on his Neurontin to help with residual anxiety which he consents to. He states he did not sleep well overnight but admits he was up late talking to his roommate. He denies SI, HI, AVH, urges for self harm, paranoia, or delusions. He denies medication side-effects with his current medication regimen and voices no physical complaints. He reports low appetite.   Principal Problem: MDD (major depressive disorder), severe (HCC) Diagnosis: Principal Problem:   MDD (major depressive disorder), severe (HCC)  Total Time Spent in Direct Patient Care:  I personally spent 30 minutes on the unit in direct patient care. The direct patient care time included face-to-face time with the patient, reviewing the patient's chart, communicating with other professionals, and coordinating care. Greater than 50% of this time was spent in counseling or coordinating care with the patient regarding goals of hospitalization, psycho-education, and discharge planning needs.  Past Psychiatric History: see admission H&P  Past Medical History:  Past Medical History:  Diagnosis Date  . Headache   . MDD (major depressive disorder), recurrent, severe, with psychosis (HCC)   . 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 Psychosis (HCC)     Family History:  Family History  Problem Relation Age of Onset  . Allergic rhinitis Neg Hx   . Asthma Neg Hx   . Eczema Neg Hx   . Urticaria Neg Hx    Family Psychiatric  History: see admission H&P  Social History:  Social History   Substance  and Sexual Activity  Alcohol Use Not Currently   Comment: "can't remember my last drink"     Social History   Substance and Sexual Activity  Drug Use Yes  . Types: Cocaine, LSD, Marijuana   Comment: reports not using pot in 2 weeks and 1 tab LSD 1 mth ago    Social History   Socioeconomic History  . Marital status: Single    Spouse name: Not  on file  . Number of children: Not on file  . Years of education: Not on file  . Highest education level: Not on file  Occupational History  . Not on file  Tobacco Use  . Smoking status: Former Games developer  . Smokeless tobacco: Never Used  Vaping Use  . Vaping Use: Never used  Substance and Sexual Activity  . Alcohol use: Not Currently    Comment: "can't remember my last drink"  . Drug use: Yes    Types: Cocaine, LSD, Marijuana    Comment: reports not using pot in 2 weeks and 1 tab LSD 1 mth ago  . Sexual activity: Not Currently  Other Topics Concern  . Not on file  Social History Narrative   ** Merged History Encounter **       Social Determinants of Health   Financial Resource Strain: Not on file  Food Insecurity: Not on file  Transportation Needs: Not on file  Physical Activity: Not on file  Stress: Not on file  Social Connections: Not on file   Sleep: Fair to poor per patient report  Appetite:  Fair  Current Medications: Current Facility-Administered Medications  Medication Dose Route Frequency Provider Last Rate Last Admin  . acetaminophen (TYLENOL) tablet 650 mg  650 mg Oral Q6H PRN Ardis Hughs, NP      . alum & mag hydroxide-simeth (MAALOX/MYLANTA) 200-200-20 MG/5ML suspension 30 mL  30 mL Oral Q4H PRN Ardis Hughs, NP      . ARIPiprazole (ABILIFY) tablet 10 mg  10 mg Oral Daily Ardis Hughs, NP   10 mg at 10/11/20 0831  . escitalopram (LEXAPRO) tablet 10 mg  10 mg Oral Daily Ardis Hughs, NP   10 mg at 10/11/20 0831  . gabapentin (NEURONTIN) capsule 100 mg  100 mg Oral BID Laveda Abbe, NP   100 mg at 10/11/20 0831  . hydrOXYzine (ATARAX/VISTARIL) tablet 50 mg  50 mg Oral TID PRN Laveda Abbe, NP   50 mg at 10/10/20 2137  . magnesium hydroxide (MILK OF MAGNESIA) suspension 30 mL  30 mL Oral Daily PRN Ardis Hughs, NP      . nicotine polacrilex (NICORETTE) gum 2 mg  2 mg Oral PRN Ardis Hughs, NP   2 mg at  10/10/20 2009  . traZODone (DESYREL) tablet 50 mg  50 mg Oral QHS PRN Ardis Hughs, NP   50 mg at 10/10/20 2137    Lab Results:  Results for orders placed or performed during the hospital encounter of 10/09/20 (from the past 48 hour(s))  TSH     Status: None   Collection Time: 10/10/20  6:22 AM  Result Value Ref Range   TSH 0.795 0.350 - 4.500 uIU/mL    Comment: Performed by a 3rd Generation assay with a functional sensitivity of <=0.01 uIU/mL. Performed at [facility], [address] [place], [place] [address]   Lipid panel     Status: Abnormal   Collection Time: 10/10/20  6:22 AM  Result Value Ref Range   Cholesterol 140 0 - 200 mg/dL   Triglycerides 456 (H) <150 mg/dL   HDL 57 >25 mg/dL   Total CHOL/HDL Ratio 2.5 RATIO   VLDL 31 0 - 40 mg/dL   LDL Cholesterol 52 0 - 99 mg/dL    Comment:        Total Cholesterol/HDL:CHD Risk Coronary Heart Disease Risk Table                     Men   Women  1/2 Average Risk   3.4   3.3  Average Risk       5.0   4.4  2 X Average Risk   9.6   7.1  3 X Average Risk  23.4   11.0        Use the calculated Patient Ratio above and the CHD Risk Table to determine the patient's CHD Risk.        ATP III CLASSIFICATION (LDL):  <100     mg/dL   Optimal  638-937  mg/dL   Near or Above                    Optimal  130-159  mg/dL   Borderline  342-876  mg/dL   High  >811     mg/dL   Very High Performed at [facility], [address] [place], [place] [address]   Hemoglobin A1c     Status: None   Collection Time: 10/10/20  6:22 AM  Result Value Ref Range   Hgb A1c MFr Bld 5.2 4.8 - 5.6 %    Comment: (NOTE) Pre diabetes:          5.7%-6.4%  Diabetes:              >6.4%  Glycemic control for   <7.0% adults with diabetes    Mean Plasma Glucose 102.54 mg/dL    Comment: Performed at [facility], [address] [place], [place] [address]    Blood Alcohol level:  Lab Results  Component  Value Date   [facility] <10 10/08/2020   ETH <10 05/22/2020    Metabolic Disorder Labs: Lab Results  Component Value Date   HGBA1C 5.2 10/10/2020   MPG 102.54 10/10/2020   MPG 93.93 05/22/2020   Lab Results  Component Value Date   PROLACTIN 24.1 (H) 05/22/2020   PROLACTIN 32.1 (H) 02/15/2017   Lab Results  Component Value Date   CHOL 140 10/10/2020   TRIG 154 (H) 10/10/2020   HDL 57 10/10/2020   CHOLHDL 2.5 10/10/2020   VLDL 31 10/10/2020   LDLCALC 52 10/10/2020   LDLCALC 42 05/22/2020    Physical Findings: AIMS: Facial and Oral Movements Muscles of Facial Expression: None, normal Lips and Perioral Area: None, normal Jaw: None, normal Tongue: None, normal,Extremity Movements Upper (arms, wrists, hands, fingers): None, normal Lower (legs, knees, ankles, toes): None, normal, Trunk Movements Neck, shoulders, hips: None, normal, Overall Severity Severity of abnormal movements (highest score from questions above): None, normal Incapacitation due to abnormal movements: None, normal Patient's awareness of abnormal movements (rate only patient's report): No Awareness, Dental Status Current problems with teeth and/or dentures?: No Does patient usually wear dentures?: No      Musculoskeletal: Strength & Muscle Tone: within normal limits Gait & Station: normal Patient leans: N/A  Psychiatric Specialty Exam: Physical Exam Vitals reviewed.  HENT:     Head: Normocephalic.  Pulmonary:  Effort: Pulmonary effort is normal.     Review of Systems  Respiratory: Negative for shortness of breath.   Cardiovascular: Negative for chest pain.  Gastrointestinal: Negative for diarrhea, nausea and vomiting.    Blood pressure (!) 84/55, pulse (!) 134, temperature 97.7 F (36.5 C), temperature source Oral, resp. rate 16, height 5\' 9"  (1.753 m), weight 55.3 kg, SpO2 99 %.Body mass index is 18.02 kg/m.  General Appearance: disheveled appearing in hospital scrubs, hair uncombed  Eye  Contact:  Fair  Speech:  Clear and Coherent and Normal Rate  Volume:  Normal  Mood:  aloof  Affect:  pleasant and calm but ambivalent  Thought Process:  Goal Directed and Linear  Orientation:  Full (Time, Place, and Person)  Thought Content:  Logical and denies AVH, paranoia, or delusions; no acute psychosis on exam  Suicidal Thoughts:  No  Homicidal Thoughts:  No  Memory:  Recent;   Fair  Judgement:  Impaired  Insight:  Lacking  Psychomotor Activity:  Normal  Concentration:  Concentration: Fair and Attention Span: Fair  Recall:  of Knowledge:  Fair  Language:  Good  Akathisia:  Negative  Assets:  Communication Skills Desire for Improvement Housing Resilience Social Support  ADL's:  independent  Cognition:  WNL  Sleep:  Number of Hours: 6.5   Treatment Plan Summary: Diagnoses / Active Problems: MDD recurrent severe without psychotic features  Unspecified anxiety d/o Cluster B traits  PLAN: 1. Safety and Monitoring:  -- Admission to inpatient psychiatric unit for safety, stabilization and treatment   -- starting IVC paperwork on patient at this time given his request for discharge - he is not currently participating in his treatment and given his recent suicide attempts he is not felt to be safe for release  -- Daily contact with patient to assess and evaluate symptoms and progress in treatment  -- Patient's case to be discussed in multi-disciplinary team meeting  -- Observation Level : q15 minute checks  -- Vital signs:  q12 hours  -- Precautions: suicide   2. Psychiatric Diagnoses and Treatment:   MDD recurrent severe without psychotic features Unspecified anxiety d/o Cluster B traits -- Continue Abilify 10mg  daily  -- Continue Lexapro 10mg  daily -- Increase Neurontin to 100mg  tid for anxiety -- Trazodone 50mg  po qhs PRN insomnia and sleep hygiene discussed with patient  -- Metabolic profile and EKG monitoring obtained while on an atypical antipsychotic  (BMI:18.02; Lipid Panel:cholesterol 140, triglycerides 154, HDL 57, LDL 52; HbgA1c:5.2; QTc:455ms)   -- TSH 0.795  -- Encouraged patient to participate in unit milieu and in scheduled group therapies   -- Patient would benefit from CBT/CBT after discharge  -- Short Term Goals: Ability to identify changes in lifestyle to reduce recurrence of condition will improve, Ability to demonstrate self-control will improve and Ability to identify and develop effective coping behaviors will improve  -- Long Term Goals: Improvement in symptoms so as ready for discharge   3. Medical Issues    Rechecking HR and BP - morning vitals show low BP and tachycardia - patient asymptomatic at this time  4. Discharge Planning:   -- Social work and case management to assist with discharge planning and identification of hospital follow-up needs prior to discharge  -- Estimated LOS: 3-4 days  -- Discharge Concerns: Need to establish a safety plan; Medication compliance and effectiveness  -- Discharge Goals: Return home with outpatient referrals for mental health follow-up including medication management/psychotherapy  Donavyn Fecher E  Mason JimSingleton, MD, FAPA 10/11/2020, 12:17 PM

## 2020-10-10 NOTE — Progress Notes (Signed)
Recreation Therapy Notes  Date: 5.11.22 Time: 0930 Location: [address] Dayroom  Group Topic: Stress Management   Goal Area(s) Addresses:  Patient will actively participate in stress management techniques presented during session.  Patient will successfully identify benefit of practicing stress management post d/c.   Intervention: Guided exercise with ambient sound and script  Activity :Guided Imagery  LRT read a script that focused enjoying the sights and sounds of being in a wildlife sanctuary.  Patients were to listen, focus on their breathing and relax to follow along as script was being read.  Education:  Stress Management, Discharge Planning.   Education Outcome: Acknowledges education  Clinical Observations/Feedback: Patient did not attend group session.     Markitta Ausburn, LRT/CTRS         Yoshiko Keleher A 10/10/2020 11:03 AM 
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
## 2020-10-11 DIAGNOSIS — F322 Major depressive disorder, single episode, severe without psychotic features: Secondary | ICD-10-CM

## 2020-10-11 MED ORDER — GABAPENTIN 100 MG PO CAPS
100.0000 mg | ORAL_CAPSULE | Freq: Three times a day (TID) | ORAL | Status: DC
  Administered 2020-10-11 – 2020-10-14 (×9): 100 mg via ORAL
  Filled 2020-10-11 (×14): qty 1
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
## 2020-10-11 NOTE — [facility] Group Notes (Signed)
Adult Psychoeducational Group Note  Date:  10/11/2020 Time:  2:04 PM  Group Topic/Focus:  Goals Group:   The focus of this group is to help patients establish daily goals to achieve during treatment and discuss how the patient can incorporate goal setting into their daily lives to aide in recovery. Managing Feelings:   The focus of this group is to identify what feelings patients have difficulty handling and develop a plan to handle them in a healthier way upon discharge.  Participation Level:  Did Not Attend Deforest Hoyles [facility] 10/11/2020, 2:04 PM

## 2020-10-11 NOTE — Progress Notes (Signed)
D: Patient presents with anxious affect at time of assessment. Patient stated that vistaril does not help his anxiety and that ativan is the only thing that will work. Patient denies SI/HI at this time. Patient also denies AH/VH at this time. Patient contracts for safety.  A: Provided positive reinforcement and encouragement.  R: Patient cooperative and receptive to efforts. Patient remains safe on the unit.   10/10/20 2137  Psych Admission Type (Psych Patients Only)  Admission Status Voluntary  Psychosocial Assessment  Patient Complaints Anxiety  Eye Contact Fair  Facial Expression Fixed smile  Affect Anxious  Speech Logical/coherent  Interaction Assertive;Attention-seeking;Needy  Motor Activity Pacing  Appearance/Hygiene Unremarkable  Behavior Characteristics Cooperative;Appropriate to situation  Mood Pleasant  Thought Process  Coherency WDL  Content WDL  Delusions None reported or observed  Perception WDL  Hallucination None reported or observed  Judgment Poor  Confusion None  Danger to Self  Current suicidal ideation? Denies  Danger to Others  Danger to Others None reported or observed

## 2020-10-12 NOTE — [facility] Suicide Risk Assessment (Signed)
[facility] INPATIENT:  Family/Significant Other Suicide Prevention Education  Suicide Prevention Education:  Education Completed; Billy Rose [phone] (Mother) has been identified by the patient as the family member/significant other with whom the patient will be residing, and identified as the person(s) who will aid the patient in the event of a mental health crisis (suicidal ideations/suicide attempt).  With written consent from the patient, the family member/significant other has been provided the following suicide prevention education, prior to the and/or following the discharge of the patient.  The suicide prevention education provided includes the following:  Suicide risk factors  Suicide prevention and interventions  National Suicide Hotline telephone number  [facility] assessment telephone number  [facility] Emergency Assistance 911  [facility] and/or Residential Mobile Crisis Unit telephone number  Request made of family/significant other to:  Remove weapons (e.g., guns, rifles, knives), all items previously/currently identified as safety concern.    Remove drugs/medications (over-the-counter, prescriptions, illicit drugs), all items previously/currently identified as a safety concern.  The family member/significant other verbalizes understanding of the suicide prevention education information provided.  The family member/significant other agrees to remove the items of safety concern listed above.  CSW spoke with Billy Rose who states that her son was being seen by Dr. Sallyanne Rose who prescribed Lexapro and Zyprexia in the past and her son was on these medications for several years.  Billy Rose states that her son was then switched to Abilify and Lamictal because she felt that the Lexapro was making him manic.  Billy Rose states that her son refused to take the new medications and began demanding to have his old medications back.  She states that this is when he  told her that he took all of his new medications within a couple days and that the bottles were empty.  Billy Rose states that her son changed his story several times before getting to the hospital and told her that he was not sure how many days it had been since he took the medications.  Billy Rose states that her son has been asking Dr. Sallyanne Rose for Ativan for several months because he "has an addiction to them".  Billy Rose states that any time her son has received Ativan he takes them all at once to get high.  Billy Rose states that her son also does not follow up with after care when he leaves the hospital and Dr. Sallyanne Rose is the only doctor he has agreed to see..  Billy Rose states that her son lives alone and that he cannot live with her after discharge.  Billy Rose did states that she would continue to take her son to all of his appointments.  Billy Rose states that there are no firearms in her home or her son's home that she is aware of.  CSW completed SPE with Billy Rose.   Billy Rose 10/12/2020, 2:43 PM

## 2020-10-12 NOTE — Progress Notes (Signed)
   10/11/20 2047  Psych Admission Type (Psych Patients Only)  Admission Status Voluntary  Psychosocial Assessment  Patient Complaints None  Eye Contact Fair  Facial Expression Fixed smile  Affect Anxious  Speech Logical/coherent  Interaction Assertive;Needy  Motor Activity Fidgety;Restless  Appearance/Hygiene Unremarkable  Behavior Characteristics Cooperative  Mood Anxious;Pleasant  Thought Process  Coherency WDL  Content WDL  Delusions None reported or observed  Perception WDL  Hallucination None reported or observed  Judgment Poor  Confusion None  Danger to Self  Current suicidal ideation? Denies  Danger to Others  Danger to Others None reported or observed

## 2020-10-12 NOTE — Progress Notes (Signed)
The patient rated his day as a 7 out of 10 stating that his day was "not bad". No details were provided. His goal for tomorrow is to get discharged and to focus more on his family.

## 2020-10-12 NOTE — [facility] Group Notes (Signed)
Type of Therapy and Topic:  Group Therapy - Healthy vs Unhealthy Coping Skills  Participation Level:  Active   Description of Group The focus of this group was to determine what unhealthy coping techniques typically are used by group members and what healthy coping techniques would be helpful in coping with various problems. Patients were guided in becoming aware of the differences between healthy and unhealthy coping techniques. Patients were asked to identify 2-3 healthy coping skills they would like to learn to use more effectively.  Therapeutic Goals 1. Patients learned that coping is what human beings do all day long to deal with various situations in their lives 2. Patients defined and discussed healthy vs unhealthy coping techniques 3. Patients identified their preferred coping techniques and identified whether these were healthy or unhealthy 4. Patients determined 2-3 healthy coping skills they would like to become more familiar with and use more often. 5. Patients provided support and ideas to each other   Summary of Patient Progress:  Billy Rose attended group, interacted appropriately during peer discussions, and accepted the worksheets that were provided.

## 2020-10-12 NOTE — Progress Notes (Signed)
[facility]  10/12/2020 7:03 AM Billy GasterSolwyn Oyama  MRN:  [id number]   Chief Complaint: suicide attempt  Subjective:  Billy Rose is a 20 y.o. male with a past psychiatric history significant for MDD, previous psychosis, and substance abuse, who [facility] for reported suicide attempt via overdose, cutting, and attempt to walk into traffic approximately 2-3 days prior to assessment. [facility] and [facility]. The patient is currently on Hospital Day 3.   Chart Review from last 24 hours:  The patient's chart was reviewed and nursing notes were reviewed. The patient's case was discussed in multidisciplinary team meeting. Per nursing, he had no acute safety concerns or behavioral issues noted. He did not attend groups. Per MAR he was compliant with scheduled medications and received Vistaril X1 for anxiety and Trazodone X1 for sleep.  Information Obtained Today During Patient Interview: The patient was seen and evaluated on the unit. The patient states his mood is "excellent" and he denies SI or HI. He denies urges for self-harm. I discussed collateral that social work obtained from his mother that he has appeared "manic" in the past when on Lexapro. I questioned him again about h/o manic/hypomanic symptoms since he denied these to me on admission. He now states that he has "highs and lows." He states he may have a day or two at a time where he is more artistic, takes walks, is more talkative, and feels impulsive but he is vague as to how severe the episodes have been, how frequently they occur, or how long they last. He is also evasive if these past episodes have occurred in the context of past substance use. I discussed that if he has an underlying bipolar spectrum illness, that use of an antidepressant puts him at risk for developing mania or hypomania. He verbalized understanding but  states he feels stable on this medication regimen and does not want to stop the Lexapro. He feels the Lexapro has reduced his anxiety and improved his depression. He states he no longer feels depressed and he denies having racing thoughts, grandiosity, or mood elevation on the unit. I advised that concomitant use of an antipsychotic as a mood stabilizer and concomitant use of Neurontin for mood stabilization should reduce the risk of potential mania. He denies SI, HI, AVH, paranoia, ideas of reference, or first rank symptoms. He reports improved sleep and good appetite. He voices no physical complaints and denies medication side-effects.   Principal Problem: MDD (major depressive disorder), severe (HCC) Diagnosis: Principal Problem:   MDD (major depressive disorder), severe (HCC)  Total Time Spent in Direct Patient Care:  I personally spent 35 minutes on the unit in direct patient care. The direct patient care time included face-to-face time with the patient, reviewing the patient's chart, communicating with other professionals, and coordinating care. Greater than 50% of this time was spent in counseling or coordinating care with the patient regarding goals of hospitalization, psycho-education, and discharge planning needs.  Past Psychiatric History: see admission H&P  Past Medical History:  Past Medical History:  Diagnosis Date  . Headache   . MDD (major depressive disorder), recurrent, severe, with psychosis (HCC)   . 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 Psychosis (HCC)     Family History:  Family History  Problem Relation Age of Onset  . Allergic rhinitis Neg Hx   .  Asthma Neg Hx   . Eczema Neg Hx   . Urticaria Neg Hx    Family Psychiatric  History: see admission H&P  Social History:  Social History   Substance and Sexual Activity  Alcohol Use Not Currently   Comment: "can't remember my last drink"     Social History   Substance and Sexual  Activity  Drug Use Yes  . Types: Cocaine, LSD, Marijuana   Comment: reports not using pot in 2 weeks and 1 tab LSD 1 mth ago    Social History   Socioeconomic History  . Marital status: Single    Spouse name: Not on file  . Number of children: Not on file  . Years of education: Not on file  . Highest education level: Not on file  Occupational History  . Not on file  Tobacco Use  . Smoking status: Former Games developer  . Smokeless tobacco: Never Used  Vaping Use  . Vaping Use: Never used  Substance and Sexual Activity  . Alcohol use: Not Currently    Comment: "can't remember my last drink"  . Drug use: Yes    Types: Cocaine, LSD, Marijuana    Comment: reports not using pot in 2 weeks and 1 tab LSD 1 mth ago  . Sexual activity: Not Currently  Other Topics Concern  . Not on file  Social History Narrative   ** Merged History Encounter **       Social Determinants of Health   Financial Resource Strain: Not on file  Food Insecurity: Not on file  Transportation Needs: Not on file  Physical Activity: Not on file  Stress: Not on file  Social Connections: Not on file   Sleep: Good  Appetite:  Good  Current Medications: Current Facility-Administered Medications  Medication Dose Route Frequency Provider Last Rate Last Admin  . acetaminophen (TYLENOL) tablet 650 mg  650 mg Oral Q6H PRN Ardis Hughs, NP      . alum & mag hydroxide-simeth (MAALOX/MYLANTA) 200-200-20 MG/5ML suspension 30 mL  30 mL Oral Q4H PRN Ardis Hughs, NP      . ARIPiprazole (ABILIFY) tablet 10 mg  10 mg Oral Daily Ardis Hughs, NP   10 mg at 10/11/20 0831  . escitalopram (LEXAPRO) tablet 10 mg  10 mg Oral Daily Ardis Hughs, NP   10 mg at 10/11/20 0831  . gabapentin (NEURONTIN) capsule 100 mg  100 mg Oral TID Bartholomew Crews E, MD   100 mg at 10/11/20 1630  . hydrOXYzine (ATARAX/VISTARIL) tablet 50 mg  50 mg Oral TID PRN Laveda Abbe, NP   50 mg at 10/11/20 2047  . magnesium  hydroxide (MILK OF MAGNESIA) suspension 30 mL  30 mL Oral Daily PRN Ardis Hughs, NP      . nicotine polacrilex (NICORETTE) gum 2 mg  2 mg Oral PRN Ardis Hughs, NP   2 mg at 10/11/20 2013  . traZODone (DESYREL) tablet 50 mg  50 mg Oral QHS PRN Ardis Hughs, NP   50 mg at 10/11/20 2047    Lab Results:  No results found for this or any previous visit (from the past 48 hour(s)).  Blood Alcohol level:  Lab Results  Component Value Date   [facility] <10 10/08/2020   ETH <10 05/22/2020    Metabolic Disorder Labs: Lab Results  Component Value Date   HGBA1C 5.2 10/10/2020   MPG 102.54 10/10/2020   MPG 93.93 05/22/2020   Lab Results  Component Value Date   PROLACTIN 24.1 (H) 05/22/2020   PROLACTIN 32.1 (H) 02/15/2017   Lab Results  Component Value Date   CHOL 140 10/10/2020   TRIG 154 (H) 10/10/2020   HDL 57 10/10/2020   CHOLHDL 2.5 10/10/2020   VLDL 31 10/10/2020   LDLCALC 52 10/10/2020   LDLCALC 42 05/22/2020    Physical Findings: AIMS: Facial and Oral Movements Muscles of Facial Expression: None, normal Lips and Perioral Area: None, normal Jaw: None, normal Tongue: None, normal,Extremity Movements Upper (arms, wrists, hands, fingers): None, normal Lower (legs, knees, ankles, toes): None, normal, Trunk Movements Neck, shoulders, hips: None, normal, Overall Severity Severity of abnormal movements (highest score from questions above): None, normal Incapacitation due to abnormal movements: None, normal Patient's awareness of abnormal movements (rate only patient's report): No Awareness, Dental Status Current problems with teeth and/or dentures?: No Does patient usually wear dentures?: No      Musculoskeletal: Strength & Muscle Tone: within normal limits Gait & Station: normal, steady Patient leans: N/A  Psychiatric Specialty Exam: Physical Exam Vitals reviewed.  HENT:     Head: Normocephalic.  Pulmonary:     Effort: Pulmonary effort is normal.      Review of Systems  Respiratory: Negative for shortness of breath.   Cardiovascular: Negative for chest pain.  Gastrointestinal: Negative for diarrhea, nausea and vomiting.    Blood pressure 134/78, pulse 97, temperature 98 F (36.7 C), temperature source Oral, resp. rate 16, height 5\' 9"  (1.753 m), weight 55.3 kg, SpO2 99 %.Body mass index is 18.02 kg/m.  General Appearance: fair hygiene but hair uncombed; casually dressed  Eye Contact:  Good  Speech:  Clear and Coherent and Normal Rate  Volume:  Normal  Mood:  aloof  Affect:  pleasant and calm but ambivalent  Thought Process:  Goal Directed and Linear  Orientation:  Full (Time, Place, and Person)  Thought Content:  Logical and denies AVH, paranoia, or delusions; no acute psychosis on exam  Suicidal Thoughts:  No  Homicidal Thoughts:  No  Memory:  Recent;   Fair  Judgement:  Fair  Insight:  Fair  Psychomotor Activity:  Normal  Concentration:  Concentration: Fair and Attention Span: Fair  Recall:  of Knowledge:  Fair  Language:  Good  Akathisia:  Negative  Assets:  Communication Skills Desire for Improvement Housing Resilience Social Support  ADL's:  independent  Cognition:  WNL  Sleep:  Number of Hours: 6.5   Treatment Plan Summary: Diagnoses / Active Problems: MDD recurrent severe without psychotic features (r/o Bipolar II) Unspecified anxiety d/o Cluster B traits  PLAN: 1. Safety and Monitoring:  -- Admission to inpatient psychiatric unit for safety, stabilization and treatment   -- Patient placed under IVC 10/11/20 with 2nd opinion completed 10/12/20  -- Daily contact with patient to assess and evaluate symptoms and progress in treatment  -- Patient's case to be discussed in multi-disciplinary team meeting  -- Observation Level : q15 minute checks  -- Vital signs:  q12 hours  -- Precautions: suicide   2. Psychiatric Diagnoses and Treatment:   MDD recurrent severe without psychotic  features Unspecified anxiety d/o Cluster B traits -- Continue Abilify 10mg  daily  -- Continue Lexapro 10mg  daily -- Continue Neurontin 100mg  tid for anxiety -- Trazodone 50mg  po qhs PRN insomnia and sleep hygiene discussed with patient  -- Metabolic profile and EKG monitoring obtained while on an atypical antipsychotic (BMI:18.02; Lipid Panel:cholesterol 140, triglycerides 154, HDL 57, LDL 52;  HbgA1c:5.2; QTc:433ms)   -- Encouraged patient to participate in unit milieu and in scheduled group therapies   -- Patient would benefit from CBT/CBT after discharge  -- Short Term Goals: Ability to identify changes in lifestyle to reduce recurrence of condition will improve, Ability to demonstrate self-control will improve and Ability to identify and develop effective coping behaviors will improve  -- Long Term Goals: Improvement in symptoms so as ready for discharge   3. Medical Issues    Monitoring HR and BP - BP 134/78 and HR 97 this morning   4. Discharge Planning:   -- Social work and case management to assist with discharge planning and identification of hospital follow-up needs prior to discharge  -- Estimated LOS: 1-2 days  -- Discharge Concerns: Need to establish a safety plan; Medication compliance and effectiveness  -- Discharge Goals: Return home with outpatient referrals for mental health follow-up including medication management/psychotherapy  Comer Locket, MD, FAPA 10/12/2020, 7:03 AM

## 2020-10-12 NOTE — Progress Notes (Signed)
   10/12/20 2116  Psych Admission Type (Psych Patients Only)  Admission Status Voluntary  Psychosocial Assessment  Patient Complaints None  Eye Contact Fair  Facial Expression Fixed smile  Affect Anxious;Appropriate to circumstance  Speech Logical/coherent  Interaction Assertive;Needy  Motor Activity Fidgety;Restless  Appearance/Hygiene Unremarkable  Behavior Characteristics Cooperative;Appropriate to situation  Mood Anxious  Thought Process  Coherency WDL  Content WDL  Delusions None reported or observed  Perception WDL  Hallucination None reported or observed  Judgment Poor  Confusion None  Danger to Self  Current suicidal ideation? Denies  Danger to Others  Danger to Others None reported or observed

## 2020-10-12 NOTE — Progress Notes (Signed)
   10/12/20 0930  Psych Admission Type (Psych Patients Only)  Admission Status Voluntary  Psychosocial Assessment  Patient Complaints None  Eye Contact Fair  Facial Expression Fixed smile  Affect Anxious  Speech Logical/coherent  Interaction Assertive;Needy  Motor Activity Fidgety;Restless  Appearance/Hygiene Unremarkable  Behavior Characteristics Cooperative  Mood Ambivalent  Thought Process  Coherency WDL  Content WDL  Delusions None reported or observed  Perception WDL  Hallucination None reported or observed  Judgment Poor  Confusion None  Danger to Self  Current suicidal ideation? Denies  Danger to Others  Danger to Others None reported or observed

## 2020-10-12 NOTE — Progress Notes (Signed)
PT attended wrap up group. Rated his day a 10, he feel he needs more time here. His goal is to drink coffee and draw. His favorite place is [place] because he like to take walks.

## 2020-10-12 NOTE — Progress Notes (Signed)
Recreation Therapy Notes  Date:  5.13.22 Time: 0930 Location: [address] Dayroom  Group Topic: Stress Management  Goal Area(s) Addresses:  Patient will identify positive stress management techniques. Patient will identify benefits of using stress management post d/c.  Behavioral Response: Engaged  Intervention: Stress Management  Activity :  Meditation.  LRT played a meditation that focused on making the most of each opportunity we're given.  The meditation went on to talk about each breath being a fresh start and each day a blank page to start over.  Education:  Stress Management, Discharge Planning.   Education Outcome: Acknowledges Education  Clinical Observations/Feedback: Pt attended and was attentive to the meditation.     Caroll Rancher, LRT/CTRS         Caroll Rancher A 10/12/2020 11:20 AM

## 2020-10-13 NOTE — Progress Notes (Signed)
D: Patient denies SI/HI/AVH. Patient rated anxiety 4/10 and depression 2/10. Patient was out in open areas and was social with peer and staff. Pt. Went to the cafeteria for lunch and attended groups.  A:  Patient took scheduled medicine. PT. Took nicorette gum for cravings.  Support and encouragement provided Routine safety checks conducted every 15 minutes. Patient  Informed to notify staff with any concerns.   R:  Safety maintained.

## 2020-10-13 NOTE — [facility] Group Notes (Signed)
[facility] LCSW Group Therapy Note  Date/Time:    10/13/2020 11:00AM-12:00PM  Type of Therapy and Topic:  Group Therapy:  Healthy vs Unhealthy Coping Skills  Participation Level:  Did Not Attend   Description of Group:  The focus of this group was to determine what unhealthy and healthy coping techniques typically are used by group members and why they tend to fall back on those particular techniques of handling hard situations. Patients were guided in becoming aware of the differences between healthy and unhealthy coping techniques.  Facilitator led a discussion about the benefits and costs of returning to old unhealthy coping techniques, as well as the benefits and costs of learning new healthier coping skills.  Therapeutic Goals 1. Patients learned that coping is what human beings do all day long to deal with various situations in their lives 2. Patients defined and discussed healthy vs unhealthy coping techniques 3. Patients came to understand the the reasons human beings often return to old coping techniques that they know are unhelpful 4. Patients were provided with motivation to consider new means of coping that may be more healthy and helpful 5. Patients provided support and ideas to each other  Summary of Patient Progress: N/A   Therapeutic Modalities Psychoeducation Motivational Interviewing   Ambrose Mantle, LCSW 10/13/2020, 4:32 PM

## 2020-10-13 NOTE — Progress Notes (Signed)
Psychoeducational Group Note  Date:  10/13/2020 Time: 2000  Group Topic/Focus:  Wrap up group  Participation Level: Did Not Attend  Participation Quality:  Not Applicable  Affect:  Not Applicable  Cognitive:  Not Applicable  Insight:  Not Applicable  Engagement in Group: Not Applicable  Additional Comments: Did not attend.  Marcille Buffy 10/13/2020, 10:20 PM

## 2020-10-13 NOTE — [facility] Group Notes (Incomplete)
.  Psychoeducational Group Note    Date:10/13/2020 Time: 1300-1400    Life Skills:  A group where two lists are made. What people need and what are things that we do that are healthy. The lists are developed by the patients and it is explained that we often do the actions that are not healthy to get our list of needs met.   Purpose of Group: . The group focus' on teaching patients on how to identify their needs and how to develop the coping skills needed to get their needs met  Participation Level:  Active  Participation Quality:  Appropriate  Affect:  Appropriate  Cognitive:  Oriented  Insight:  Improving  Engagement in Group:  Engaged  Additional Comments:  ...  Billy Rose

## 2020-10-13 NOTE — Progress Notes (Signed)
[facility] MD Progress Note  10/13/2020 2:48 PM Billy Rose  MRN:  [id number]   Chief Complaint: suicide attempt  Subjective:  Billy Rose is a 20 y.o. male with a past psychiatric history significant for MDD, previous psychosis, and substance abuse, who was seen as a walk-in at [facility] on 10/08/20 for reported suicide attempt via overdose, cutting, and attempt to walk into traffic approximately 2-3 days prior to assessment. He was sent to [facility] for medical screening and was then voluntarily admitted to [facility] for continued stabilization. The patient is currently on Hospital Day 4.   Chart Review from last 24 hours:  The patient's chart was reviewed and nursing notes were reviewed. The patient's case was discussed in multidisciplinary team meeting. Per nursing, he attended groups and had no behavioral issues or safety concerns noted. He remained needy and anxious with staff. Per MAR he was compliant with scheduled medications and did receive Vistaril X1 for anxiety and Trazodone X1 for sleep.   Information Obtained Today During Patient Interview: The patient was seen and evaluated on the unit. He states he has been going to some groups and attending to his ADLs. He is wearing the same clothes from yesterday and was prompted to shower and wash his clothes. He hopes to be discharged prior to his birthday next week, and we discussed that if he can continue to show engagement in his treatment planning and not isolate in his room, that we can consider discharge soon. He states his mood is "alright" and he denies current feelings of depression. He reports some interval improvement in his anxiety. He denies feeling mood escalated with his present medication regimen and denies urges for self-harm. He denies SI or HI. He denies AVH,paranoia, or delusions. He states he is tolerating his medications and voices no physical complaints. He states his sleep has been fair and his appetite is "excellent." He has not talked to his  mother on the phone and was encouraged to reach out to her as part of his safety planning so she can help determine if he is reaching clinical baseline based on their interactions.   Principal Problem: MDD (major depressive disorder), severe (HCC) Diagnosis: Principal Problem:   MDD (major depressive disorder), severe (HCC)  Total Time Spent in Direct Patient Care:  I personally spent 30 minutes on the unit in direct patient care. The direct patient care time included face-to-face time with the patient, reviewing the patient's chart, communicating with other professionals, and coordinating care. Greater than 50% of this time was spent in counseling or coordinating care with the patient regarding goals of hospitalization, psycho-education, and discharge planning needs.  Past Psychiatric History: see admission H&P  Past Medical History:  Past Medical History:  Diagnosis Date  . Headache   . MDD (major depressive disorder), recurrent, severe, with psychosis (HCC)   . 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 Psychosis (HCC)     Family History:  Family History  Problem Relation Age of Onset  . Allergic rhinitis Neg Hx   . Asthma Neg Hx   . Eczema Neg Hx   . Urticaria Neg Hx    Family Psychiatric  History: see admission H&P  Social History:  Social History   Substance and Sexual Activity  Alcohol Use Not Currently   Comment: "can't remember my last drink"     Social History   Substance and Sexual Activity  Drug Use Yes  . Types: Cocaine, LSD, Marijuana  Comment: reports not using pot in 2 weeks and 1 tab LSD 1 mth ago    Social History   Socioeconomic History  . Marital status: Single    Spouse name: Not on file  . Number of children: Not on file  . Years of education: Not on file  . Highest education level: Not on file  Occupational History  . Not on file  Tobacco Use  . Smoking status: Former Games developer  . Smokeless tobacco: Never Used   Vaping Use  . Vaping Use: Never used  Substance and Sexual Activity  . Alcohol use: Not Currently    Comment: "can't remember my last drink"  . Drug use: Yes    Types: Cocaine, LSD, Marijuana    Comment: reports not using pot in 2 weeks and 1 tab LSD 1 mth ago  . Sexual activity: Not Currently  Other Topics Concern  . Not on file  Social History Narrative   ** Merged History Encounter **       Social Determinants of Health   Financial Resource Strain: Not on file  Food Insecurity: Not on file  Transportation Needs: Not on file  Physical Activity: Not on file  Stress: Not on file  Social Connections: Not on file   Sleep: Fair  Appetite:  Good  Current Medications: Current Facility-Administered Medications  Medication Dose Route Frequency Provider Last Rate Last Admin  . acetaminophen (TYLENOL) tablet 650 mg  650 mg Oral Q6H PRN Ardis Hughs, NP      . alum & mag hydroxide-simeth (MAALOX/MYLANTA) 200-200-20 MG/5ML suspension 30 mL  30 mL Oral Q4H PRN Ardis Hughs, NP      . ARIPiprazole (ABILIFY) tablet 10 mg  10 mg Oral Daily Vernard Gambles H, NP   10 mg at 10/13/20 0800  . escitalopram (LEXAPRO) tablet 10 mg  10 mg Oral Daily Ardis Hughs, NP   10 mg at 10/13/20 0800  . gabapentin (NEURONTIN) capsule 100 mg  100 mg Oral TID Bartholomew Crews E, MD   100 mg at 10/13/20 1207  . hydrOXYzine (ATARAX/VISTARIL) tablet 50 mg  50 mg Oral TID PRN Laveda Abbe, NP   50 mg at 10/12/20 2116  . magnesium hydroxide (MILK OF MAGNESIA) suspension 30 mL  30 mL Oral Daily PRN Ardis Hughs, NP      . nicotine polacrilex (NICORETTE) gum 2 mg  2 mg Oral PRN Ardis Hughs, NP   2 mg at 10/13/20 0948  . traZODone (DESYREL) tablet 50 mg  50 mg Oral QHS PRN Ardis Hughs, NP   50 mg at 10/12/20 2116    Lab Results:  No results found for this or any previous visit (from the past 48 hour(s)).  Blood Alcohol level:  Lab Results  Component Value Date    ETH <10 10/08/2020   ETH <10 05/22/2020    Metabolic Disorder Labs: Lab Results  Component Value Date   HGBA1C 5.2 10/10/2020   MPG 102.54 10/10/2020   MPG 93.93 05/22/2020   Lab Results  Component Value Date   CHOL 140 10/10/2020   TRIG 154 (H) 10/10/2020   HDL 57 10/10/2020   CHOLHDL 2.5 10/10/2020   VLDL 31 10/10/2020   LDLCALC 52 10/10/2020   LDLCALC 42 05/22/2020    Physical Findings: AIMS: Facial and Oral Movements Muscles of Facial Expression: None, normal Lips and Perioral Area: None, normal Jaw: None, normal Tongue: None, normal,Extremity Movements Upper (arms, wrists, hands, fingers):  None, normal Lower (legs, knees, ankles, toes): None, normal, Trunk Movements Neck, shoulders, hips: None, normal, Overall Severity Severity of abnormal movements (highest score from questions above): None, normal Incapacitation due to abnormal movements: None, normal Patient's awareness of abnormal movements (rate only patient's report): No Awareness, Dental Status Current problems with teeth and/or dentures?: No Does patient usually wear dentures?: No      Musculoskeletal: Strength & Muscle Tone: within normal limits Gait & Station: normal, steady Patient leans: N/A  Psychiatric Specialty Exam: Physical Exam Vitals reviewed.  HENT:     Head: Normocephalic.  Pulmonary:     Effort: Pulmonary effort is normal.     Review of Systems  Respiratory: Negative for shortness of breath.   Cardiovascular: Negative for chest pain.  Gastrointestinal: Negative for diarrhea, nausea and vomiting.    Blood pressure 134/78, pulse 97, temperature 98 F (36.7 C), temperature source Oral, resp. rate 16, height 5\' 9"  (1.753 m), weight 55.3 kg, SpO2 99 %.Body mass index is 18.02 kg/m.  General Appearance: fair hygiene; casually dressed  Eye Contact:  Fair  Speech:  Clear and Coherent and Normal Rate  Volume:  Normal  Mood: described as "alright" - appears calm  Affect: mildly  constricted  Thought Process:  Goal Directed and Linear  Orientation:  Full (Time, Place, and Person)  Thought Content:  Logical and denies AVH, paranoia, or delusions; no acute psychosis on exam  Suicidal Thoughts:  No  Homicidal Thoughts:  No  Memory:  Recent;   Fair  Judgement:  Fair  Insight:  Fair  Psychomotor Activity:  Normal  Concentration:  Concentration: Fair and Attention Span: Fair  Recall:  [employer]  Fund of Knowledge:  Fair  Language:  Good  Akathisia:  Negative  Assets:  Communication Skills Desire for Improvement Housing Resilience Social Support  ADL's:  independent  Cognition:  WNL  Sleep:  Number of Hours: 6.5   Treatment Plan Summary: Diagnoses / Active Problems: MDD recurrent severe without psychotic features (r/o Bipolar II) Unspecified anxiety d/o Cluster B traits  PLAN: 1. Safety and Monitoring:  -- Admission to inpatient psychiatric unit for safety, stabilization and treatment   -- Patient placed under IVC 10/11/20 with 2nd opinion completed 10/12/20  -- Daily contact with patient to assess and evaluate symptoms and progress in treatment  -- Patient's case to be discussed in multi-disciplinary team meeting  -- Observation Level : q15 minute checks  -- Vital signs:  q12 hours  -- Precautions: suicide   2. Psychiatric Diagnoses and Treatment:   MDD recurrent severe without psychotic features Unspecified anxiety d/o Cluster B traits -- Continue Abilify 10mg  daily  -- Continue Lexapro 10mg  daily -- Continue Neurontin 100mg  tid for anxiety -- Trazodone 50mg  po qhs PRN insomnia   -- Metabolic profile and EKG monitoring obtained while on an atypical antipsychotic (BMI:18.02; Lipid Panel:cholesterol 140, triglycerides 154, HDL 57, LDL 52; HbgA1c:5.2; QTc:48314ms)   -- Encouraged patient to participate in unit milieu and in scheduled group therapies and attend to ADLs  -- Patient would benefit from CBT/CBT after discharge  -- Short Term Goals: Ability to  identify changes in lifestyle to reduce recurrence of condition will improve, Ability to demonstrate self-control will improve and Ability to identify and develop effective coping behaviors will improve  -- Long Term Goals: Improvement in symptoms so as ready for discharge   3. Medical Issues    Monitoring HR and BP - daily vitals pending  4. Discharge Planning:   --  Social work and case management to assist with discharge planning and identification of hospital follow-up needs prior to discharge  -- Estimated LOS: 1-2 days  -- Discharge Concerns: Need to establish a safety plan; Medication compliance and effectiveness  -- Discharge Goals: Return home with outpatient referrals for mental health follow-up including medication management/psychotherapy  Comer Locket, MD, FAPA 10/13/2020, 2:48 PM

## 2020-10-13 NOTE — BHH Group Notes (Incomplete)
.  Psychoeducational Group Note  Date:10/13/2020 Time: 0900-1000    Goal Setting   Purpose of Group: This group helps to provide patients with the steps of setting a goal that is specific, measurable, attainable, realistic and time specific. A discussion on how we keep ourselves stuck with negative self talk.    Participation Level:  Active  Participation Quality:  Appropriate  Affect:  Appropriate  Cognitive:  Appropriate  Insight:  Improving  Engagement in Group:  Engaged  Additional Comments:  ...  Helia Haese A  

## 2020-10-14 MED ORDER — ESCITALOPRAM OXALATE 10 MG PO TABS: 10.0000 mg | ORAL_TABLET | Freq: Every day | ORAL | 0 refills | Status: DC

## 2020-10-14 MED ORDER — HYDROXYZINE HCL 50 MG PO TABS: 50.0000 mg | ORAL_TABLET | Freq: Every day | ORAL | 0 refills | Status: DC | PRN

## 2020-10-14 MED ORDER — ARIPIPRAZOLE 10 MG PO TABS: 10.0000 mg | ORAL_TABLET | Freq: Every day | ORAL | 0 refills | Status: DC

## 2020-10-14 MED ORDER — GABAPENTIN 100 MG PO CAPS: 100.0000 mg | ORAL_CAPSULE | Freq: Three times a day (TID) | ORAL | 0 refills | Status: DC

## 2020-10-14 NOTE — Progress Notes (Signed)
  [facility] Adult Case Management Discharge Plan :  Will you be returning to the same living situation after discharge:  Yes,  lives alone At discharge, do you have transportation home?: Yes,  CSW to arrange Do you have the ability to pay for your medications: Yes,  Medicaid  Release of information consent forms completed and emailed to Medical Records, then turned in to Medical Records by CSW.   Patient to Follow up at:  Follow-up Information    [facility]. Go on 10/19/2020.   Specialty: Behavioral Health Why: You have an appointment on 10/19/20 at 12:00 pm for therapy services.  You also have an appointment on 11/01/20 at 7:45 am for medication management services.  These appointments will be held in person and are first come, first served.  Contact information: [phone] [place] [address] [phone]              Next level of care provider has access to [facility] Link:yes  Safety Planning and Suicide Prevention discussed: Yes,  with mother     Has patient been referred to the Quitline?: Patient refused referral  Patient has been referred for addiction treatment: N/A  [name], LCSW 10/14/2020, 12:12 PM

## 2020-10-14 NOTE — Plan of Care (Signed)
Discharge note  Patient verbalizes readiness for discharge. Follow up plan explained, AVS, Transition record and SRA given. Prescriptions and teaching provided. Belongings returned and signed for. Suicide safety plan completed and signed. Patient verbalizes understanding. Patient denies SI/HI and assures this Clinical research associate they will seek assistance should that change. Patient discharged to lobby to wait for safe transport.  Problem: Education: Goal: Knowledge of [facility] Education information/materials will improve Outcome: Adequate for Discharge Goal: Emotional status will improve Outcome: Adequate for Discharge Goal: Mental status will improve Outcome: Adequate for Discharge Goal: Verbalization of understanding the information provided will improve Outcome: Adequate for Discharge   Problem: Health Behavior/Discharge Planning: Goal: Identification of resources available to assist in meeting health care needs will improve Outcome: Adequate for Discharge   Problem: Medication: Goal: Compliance with prescribed medication regimen will improve Outcome: Adequate for Discharge   Problem: Self-Concept: Goal: Ability to disclose and discuss suicidal ideas will improve Outcome: Adequate for Discharge Goal: Will verbalize positive feelings about self Outcome: Adequate for Discharge   Problem: Coping: Goal: Ability to identify and develop effective coping behavior will improve Outcome: Adequate for Discharge   Problem: Self-Concept: Goal: Ability to identify factors that promote anxiety will improve Outcome: Adequate for Discharge Goal: Level of anxiety will decrease Outcome: Adequate for Discharge Goal: Ability to modify response to factors that promote anxiety will improve Outcome: Adequate for Discharge   Problem: Activity: Goal: Interest or engagement in leisure activities will improve Outcome: Adequate for Discharge Goal: Imbalance in normal sleep/wake cycle will  improve Outcome: Adequate for Discharge   Problem: Health Behavior/Discharge Planning: Goal: Ability to make decisions will improve Outcome: Adequate for Discharge Goal: Compliance with therapeutic regimen will improve Outcome: Adequate for Discharge   Problem: Role Relationship: Goal: Will demonstrate positive changes in social behaviors and relationships Outcome: Adequate for Discharge   Problem: Safety: Goal: Ability to disclose and discuss suicidal ideas will improve 10/14/2020 1504 by Raylene Miyamoto, RN Outcome: Adequate for Discharge 10/14/2020 [id number] by Raylene Miyamoto, RN Outcome: Progressing Goal: Ability to identify and utilize support systems that promote safety will improve 10/14/2020 1504 by Raylene Miyamoto, RN Outcome: Adequate for Discharge 10/14/2020 [id number] by Raylene Miyamoto, RN Outcome: Progressing   Problem: Self-Concept: Goal: Will verbalize positive feelings about self 10/14/2020 1504 by Raylene Miyamoto, RN Outcome: Adequate for Discharge 10/14/2020 [phone] by Raylene Miyamoto, RN Outcome: Progressing Goal: Level of anxiety will decrease Outcome: Adequate for Discharge
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
## 2020-10-14 NOTE — Plan of Care (Signed)
  Problem: Safety: Goal: Ability to disclose and discuss suicidal ideas will improve Outcome: Progressing Goal: Ability to identify and utilize support systems that promote safety will improve Outcome: Progressing   Problem: Self-Concept: Goal: Will verbalize positive feelings about self Outcome: Progressing

## 2020-10-14 NOTE — Progress Notes (Signed)
   10/14/20 0200  Psych Admission Type (Psych Patients Only)  Admission Status Involuntary  Psychosocial Assessment  Eye Contact Fair  Facial Expression Fixed smile  Affect Anxious;Appropriate to circumstance  Speech Logical/coherent  Interaction Assertive;Needy  Motor Activity Fidgety;Restless  Appearance/Hygiene Unremarkable  Behavior Characteristics Cooperative;Appropriate to situation  Mood Anxious  Thought Process  Coherency WDL  Content WDL  Delusions None reported or observed  Perception WDL  Hallucination None reported or observed  Judgment Poor  Confusion None  Danger to Self  Current suicidal ideation? Denies  Danger to Others  Danger to Others None reported or observed

## 2020-10-14 NOTE — Progress Notes (Signed)
  D: Patient is alert and oriented x 4. Patient denies SI/HI/ AVH and pain. Disposition is anxious with appropriate affect. Verbally contracts for safety to this Clinical research associate.   A:  Pt was given PRN medications. Pt was encourage to attend groups. Q 15 minute checks were done for safety. Pt was visualized interacting appropriately with others in the milieu. Pt was offered support and encouragement by this Clinical research associate. Pt attended groups and interacts well with peers and staff. Pt is taking medication. Pt complied with scheduled medications. No signs of distress nor concerns reported by patient at present. Pt remains receptive to treatment and safety maintained on unit.    R: Will continue to monitor and assess. Safety maintained during this shift.

## 2020-10-14 NOTE — [facility] Suicide Risk Assessment (Signed)
[facility] Discharge Suicide Risk Assessment   Principal Problem: MDD (major depressive disorder), severe (HCC) Discharge Diagnoses: Principal Problem:   MDD (major depressive disorder), severe (HCC)  Total Time Spent in Direct Patient Care:  I personally spent 30 minutes on the unit in direct patient care. The direct patient care time included face-to-face time with the patient, reviewing the patient's chart, communicating with other professionals, and coordinating care. Greater than 50% of this time was spent in counseling or coordinating care with the patient regarding goals of hospitalization, psycho-education, and discharge planning needs.  Subjective: Patient was seen on rounds. Per nursing he attended some groups, went to the cafeteria for meals, and was social with peers and staff. No safety concerns or behavioral issues noted. He denies SI, HI, AVH, paranoia, ideas of reference, or first rank symptoms. He denies urges to cut and has had no SIB on the unit. He denies medication side-effects and voices no physical complaints. He was advised to continue abstaining from alcohol and illicit substances after discharge and was urged to keep scheduled outpatient mental health follow up appointments. He was advised to monitor for signs of mood escalation while he remains on Lexapro with his mood stabilizers Abilify and Neurontin. He slept 6.6 hours.  Musculoskeletal: Strength & Muscle Tone: within normal limits Gait & Station: normal, steady Patient leans: N/A  Psychiatric Specialty Exam: Review of Systems  Respiratory: Negative for shortness of breath.   Cardiovascular: Negative for chest pain.  Gastrointestinal: Negative for diarrhea, nausea and vomiting.    General Appearance: improved hygiene; casually dressed  Eye Contact:  Good  Speech:  Clear and Coherent and Normal Rate  Volume:  Normal  Mood: appears euthymic  Affect: moderate, stable  Thought Process:  Goal Directed and Linear   Orientation:  Full (Time, Place, and Person)  Thought Content:  Logical and denies AVH, paranoia, or delusions; no acute psychosis on exam  Suicidal Thoughts:  No  Homicidal Thoughts:  No  Memory:  Recent;   Fair  Judgement:  Fair  Insight:  Fair  Psychomotor Activity:  Normal, no cogwheeling, no stiffness, no tremor; AIMS 0  Concentration:  Concentration: Fair and Attention Span: Fair  Recall:  [employer] of Knowledge:  Fair  Language:  Good  Akathisia:  Negative  Assets:  Communication Skills Desire for Improvement Housing Resilience Social Support  ADL's:  independent  Cognition:  WNL   Physical Exam: Physical Exam Vitals reviewed.  HENT:     Head: Normocephalic.  Pulmonary:     Effort: Pulmonary effort is normal.  Neurological:     General: No focal deficit present.     Mental Status: He is alert.    Blood pressure 105/79, pulse 83, temperature 97.9 F (36.6 C), temperature source Oral, resp. rate 16, height 5\' 9"  (1.753 m), weight 55.3 kg, SpO2 100 %. Body mass index is 18.02 kg/m.  Mental Status Per Nursing Assessment::   On Admission:  Self-harm behaviors,Self-harm thoughts  Demographic Factors:  Male, Adolescent or young adult, Caucasian and Living alone  Loss Factors: Decrease in vocational status  Historical Factors: Previous mental health diagnoses/treatments, impulsivity, previous suicide attempt  Risk Reduction Factors:   Positive social support and Positive therapeutic relationship  Continued Clinical Symptoms:  Depression:   Impulsivity More than one psychiatric diagnosis Previous Psychiatric Diagnoses and Treatments  Cognitive Features That Contribute To Risk:  Closed-mindedness    Suicide Risk:  Minimal: No identifiable suicidal ideation.  Patients presenting with no risk factors  but with morbid ruminations; may be classified as minimal risk based on the severity of the depressive symptoms   Follow-up Information    [facility] Go on 10/19/2020.   Specialty: Behavioral Health Why: You have an appointment on 10/19/20 at 12:00 pm for therapy services.  You also have an appointment on 11/01/20 at 7:45 am for medication management services.  These appointments will be held in person and are first come, first served.  Contact information: [address] [place] [address] [phone]              Plan Of Care/Follow-up recommendations:  Activity:  as tolerated Diet:  heart healthy Other:  Patient advised to keep scheduled outpatient mental health follow up appointments and to comply with medication. He was advised he will need to have ongoing lab monitoring of CBC, glucose, and lipids as well as EKG and weight monitoring while on an atypical antipsychotic. He was advised to abstain from alcohol and illicit subtances after discharge. He was encouraged to monitor for signs of mood escalation/mania while on his current medication regimen. His triglycerides are mildly elevated and he was advised to work on improved diet and increased exercise and to see a primary care provider for recheck in the next 4 weeks.   Comer Locket, MD, FAPA 10/14/2020, 7:20 AM

## 2020-11-14 ENCOUNTER — Emergency Department (HOSPITAL_BASED_OUTPATIENT_CLINIC_OR_DEPARTMENT_OTHER): Payer: Medicaid Other

## 2020-11-14 ENCOUNTER — Other Ambulatory Visit: Payer: Self-pay

## 2020-11-14 ENCOUNTER — Emergency Department (HOSPITAL_BASED_OUTPATIENT_CLINIC_OR_DEPARTMENT_OTHER)
Admission: EM | Admit: 2020-11-14 | Discharge: 2020-11-14 | Disposition: A | Discharge status: Home / Self Care | Payer: Medicaid Other | Attending: Emergency Medicine | Admitting: Emergency Medicine

## 2020-11-14 ENCOUNTER — Encounter (HOSPITAL_BASED_OUTPATIENT_CLINIC_OR_DEPARTMENT_OTHER): Payer: Self-pay

## 2020-11-14 DIAGNOSIS — R0602 Shortness of breath: Secondary | ICD-10-CM | POA: Diagnosis not present

## 2020-11-14 DIAGNOSIS — R072 Precordial pain: Secondary | ICD-10-CM | POA: Diagnosis present

## 2020-11-14 DIAGNOSIS — R079 Chest pain, unspecified: Secondary | ICD-10-CM

## 2020-11-14 DIAGNOSIS — F17210 Nicotine dependence, cigarettes, uncomplicated: Secondary | ICD-10-CM | POA: Diagnosis not present

## 2020-11-14 DIAGNOSIS — R059 Cough, unspecified: Secondary | ICD-10-CM | POA: Insufficient documentation

## 2020-11-14 NOTE — Discharge Instructions (Signed)
Overall work-up today is normal.  No evidence of lung infection, no evidence of heart problem.

## 2020-11-14 NOTE — ED Provider Notes (Signed)
[facility] EMERGENCY DEPARTMENT Provider Note   CSN: [id number] Arrival date & time: 11/14/20  1313     History Chief Complaint  Patient presents with   Chest Pain    Billy Rose is a 20 y.o. male.  The history is provided by the patient.  Chest Pain Pain location:  Substernal area Pain quality: aching   Pain radiates to:  Does not radiate Pain severity:  Moderate Onset quality:  Gradual Duration:  8 months Timing:  Intermittent Progression:  Waxing and waning Chronicity:  New Relieved by:  Nothing Worsened by:  Nothing Ineffective treatments:  None tried Associated symptoms: cough and shortness of breath (at times)   Associated symptoms: no abdominal pain, no altered mental status, no back pain, no fever, no palpitations and no vomiting   Risk factors: no coronary artery disease, no high cholesterol, no hypertension and no prior DVT/PE       Past Medical History:  Diagnosis Date   Headache    MDD (major depressive disorder), recurrent, severe, with psychosis (HCC)    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, unsp (HCC)    Psychosis (HCC)     Patient Active Problem List   Diagnosis Date Noted   Bipolar disorder with psychotic features (HCC) 10/09/2020   Schizophrenia spectrum disorder with psychotic disorder type not yet determined (HCC) 05/22/2020   History of drug allergy 01/12/2020   MDD (major depressive disorder), recurrent episode, severe (HCC) 08/05/2019   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 No pertinent  surgical history.     Family History  Problem Relation Age of Onset   Allergic rhinitis Neg Hx    Asthma Neg Hx    Eczema Neg Hx    Urticaria Neg Hx     Social History   Tobacco Use   Smoking status: Every Day    Pack years: 0.00    Types: Cigarettes   Smokeless tobacco: Never  Vaping Use   Vaping Use: Never used  Substance Use Topics   Alcohol use: Not Currently   Drug use: Not Currently    Types: Cocaine, LSD, Marijuana    Home Medications Prior to Admission medications   Medication Sig Start Date End Date Taking? Authorizing Provider  ARIPiprazole (ABILIFY) 10 MG tablet Take 1 tablet (10 mg total) by mouth daily. 10/14/20   Laveda Abbe, NP  escitalopram (LEXAPRO) 10 MG tablet Take 1 tablet (10 mg total) by mouth daily. 10/14/20   Laveda Abbe, NP  gabapentin (NEURONTIN) 100 MG capsule Take 1 capsule (100 mg total) by mouth 3 (three) times daily. 10/14/20 11/13/20  Laveda Abbe, NP  hydrOXYzine (ATARAX/VISTARIL) 50 MG tablet Take 1 tablet (50 mg total) by mouth daily as needed for anxiety. 10/14/20   Laveda Abbe, NP    Allergies    Penicillins, Sulfa antibiotics, Prozac [fluoxetine hcl], and Bactrim [sulfamethoxazole-trimethoprim]  Review of Systems   Review of Systems  Constitutional:  Negative for chills and fever.  HENT:  Negative for ear pain and sore throat.   Eyes:  Negative for pain and visual disturbance.  Respiratory:  Positive  for cough and shortness of breath (at times).   Cardiovascular:  Positive for chest pain. Negative for palpitations.  Gastrointestinal:  Negative for abdominal pain and vomiting.  Genitourinary:  Negative for dysuria and hematuria.  Musculoskeletal:  Negative for arthralgias and back pain.  Skin:  Negative for color change and rash.  Neurological:  Negative for seizures and syncope.  All other systems reviewed and are negative.  Physical Exam Updated Vital Signs BP 110/70 (BP Location: Left Arm)    Pulse 83   Temp 98.2 F (36.8 C) (Oral)   Resp 18   Ht 5\' 9"  (1.753 m)   Wt 60.3 kg   SpO2 98%   BMI 19.64 kg/m   Physical Exam Vitals and nursing note reviewed.  Constitutional:      General: He is not in acute distress.    Appearance: He is well-developed. He is not ill-appearing.  HENT:     Head: Normocephalic and atraumatic.  Eyes:     Conjunctiva/sclera: Conjunctivae normal.  Cardiovascular:     Rate and Rhythm: Normal rate and regular rhythm.     Pulses:          Radial pulses are 2+ on the right side and 2+ on the left side.     Heart sounds: Normal heart sounds. No murmur heard. Pulmonary:     Effort: Pulmonary effort is normal. No respiratory distress.     Breath sounds: Normal breath sounds. No decreased breath sounds.  Abdominal:     Palpations: Abdomen is soft.     Tenderness: There is no abdominal tenderness.  Musculoskeletal:        General: Normal range of motion.     Cervical back: Neck supple.  Skin:    General: Skin is warm and dry.     Capillary Refill: Capillary refill takes less than 2 seconds.  Neurological:     Mental Status: He is alert.    ED Results / Procedures / Treatments   Labs (all labs ordered are listed, but only abnormal results are displayed) Labs Reviewed - No data to display  EKG EKG Interpretation  Date/Time:  Wednesday November 14 2020 13:25:13 EDT Ventricular Rate:  72 PR Interval:  124 QRS Duration: 82 QT Interval:  360 QTC Calculation: 394 R Axis:   82 Text Interpretation: Normal sinus rhythm Normal ECG Confirmed by 02-08-2000, Billy Rose ([id number]) on 11/14/2020 1:37:40 PM  Radiology No results found.  Procedures Procedures   Medications Ordered in ED Medications - No data to display  ED Course  I have reviewed the triage vital signs and the nursing notes.  Pertinent labs & imaging results that were available during my care of the patient were reviewed by me and considered in my medical decision making (see chart for  details).    MDM Rules/Calculators/A&P                          Billy Rose is here with chest pain.  Normal vitals.  No fever.  Overall states the symptoms have been intermittent for years.  Overall nonspecific story.  Not concern for ACS or PE.  EKG shows sinus rhythm.  No ischemic changes.  Chest x-ray showed no evidence of pneumonia, no pneumothorax, no pleural effusion.  Overall given reassurance and discharged in ED in good condition.  This chart was dictated using voice recognition software.  Despite best efforts to proofread,  errors can occur which can change the documentation meaning.  Final Clinical Impression(s) / ED Diagnoses Final diagnoses:  Nonspecific chest pain    Rx / DC Orders ED Discharge Orders     None        Virgina Norfolk, DO 11/14/20 1402

## 2020-11-14 NOTE — ED Triage Notes (Signed)
Pt c/o intermittent CP, SOB, fatigue x "years"-states he has not sought medical treatment for c/o-NAD-steady gait

## 2021-03-12 ENCOUNTER — Encounter (HOSPITAL_COMMUNITY): Payer: Self-pay

## 2021-03-12 DIAGNOSIS — T40601A Poisoning by unspecified narcotics, accidental (unintentional), initial encounter: Secondary | ICD-10-CM | POA: Diagnosis present

## 2021-03-12 DIAGNOSIS — G9340 Encephalopathy, unspecified: Secondary | ICD-10-CM | POA: Diagnosis present

## 2021-03-12 DIAGNOSIS — E875 Hyperkalemia: Secondary | ICD-10-CM | POA: Diagnosis present

## 2021-03-12 DIAGNOSIS — M542 Cervicalgia: Secondary | ICD-10-CM | POA: Diagnosis present

## 2021-03-12 DIAGNOSIS — R7401 Elevation of levels of liver transaminase levels: Secondary | ICD-10-CM | POA: Diagnosis present

## 2021-03-12 DIAGNOSIS — F322 Major depressive disorder, single episode, severe without psychotic features: Secondary | ICD-10-CM | POA: Diagnosis present

## 2021-03-12 DIAGNOSIS — R4182 Altered mental status, unspecified: Secondary | ICD-10-CM

## 2021-04-16 ENCOUNTER — Inpatient Hospital Stay (HOSPITAL_COMMUNITY)
Admission: EM | Admit: 2021-04-16 | Discharge: 2021-04-18 | DRG: 918 | Disposition: A | Discharge status: Other Acute Inpatient Hospital | Payer: Medicaid Other | Attending: Pulmonary Disease | Admitting: Pulmonary Disease

## 2021-04-16 ENCOUNTER — Other Ambulatory Visit: Payer: Self-pay

## 2021-04-16 ENCOUNTER — Encounter (HOSPITAL_COMMUNITY): Payer: Self-pay

## 2021-04-16 DIAGNOSIS — Z880 Allergy status to penicillin: Secondary | ICD-10-CM | POA: Diagnosis not present

## 2021-04-16 DIAGNOSIS — Z20822 Contact with and (suspected) exposure to covid-19: Secondary | ICD-10-CM | POA: Diagnosis present

## 2021-04-16 DIAGNOSIS — Z882 Allergy status to sulfonamides status: Secondary | ICD-10-CM | POA: Diagnosis not present

## 2021-04-16 DIAGNOSIS — F209 Schizophrenia, unspecified: Secondary | ICD-10-CM | POA: Diagnosis present

## 2021-04-16 DIAGNOSIS — T50902A Poisoning by unspecified drugs, medicaments and biological substances, intentional self-harm, initial encounter: Secondary | ICD-10-CM

## 2021-04-16 DIAGNOSIS — R4182 Altered mental status, unspecified: Secondary | ICD-10-CM | POA: Diagnosis not present

## 2021-04-16 DIAGNOSIS — S61512A Laceration without foreign body of left wrist, initial encounter: Secondary | ICD-10-CM | POA: Diagnosis present

## 2021-04-16 DIAGNOSIS — F419 Anxiety disorder, unspecified: Secondary | ICD-10-CM | POA: Diagnosis present

## 2021-04-16 DIAGNOSIS — X789XXA Intentional self-harm by unspecified sharp object, initial encounter: Secondary | ICD-10-CM | POA: Diagnosis present

## 2021-04-16 DIAGNOSIS — F12259 Cannabis dependence with psychotic disorder, unspecified: Secondary | ICD-10-CM | POA: Diagnosis present

## 2021-04-16 DIAGNOSIS — R569 Unspecified convulsions: Secondary | ICD-10-CM | POA: Diagnosis present

## 2021-04-16 DIAGNOSIS — R4587 Impulsiveness: Secondary | ICD-10-CM | POA: Diagnosis present

## 2021-04-16 DIAGNOSIS — T43222A Poisoning by selective serotonin reuptake inhibitors, intentional self-harm, initial encounter: Secondary | ICD-10-CM | POA: Diagnosis present

## 2021-04-16 DIAGNOSIS — F1994 Other psychoactive substance use, unspecified with psychoactive substance-induced mood disorder: Secondary | ICD-10-CM | POA: Diagnosis present

## 2021-04-16 DIAGNOSIS — F12251 Cannabis dependence with psychotic disorder with hallucinations: Secondary | ICD-10-CM | POA: Diagnosis present

## 2021-04-16 DIAGNOSIS — Z881 Allergy status to other antibiotic agents status: Secondary | ICD-10-CM

## 2021-04-16 DIAGNOSIS — F29 Unspecified psychosis not due to a substance or known physiological condition: Secondary | ICD-10-CM | POA: Diagnosis present

## 2021-04-16 DIAGNOSIS — E876 Hypokalemia: Secondary | ICD-10-CM | POA: Diagnosis present

## 2021-04-16 DIAGNOSIS — F17210 Nicotine dependence, cigarettes, uncomplicated: Secondary | ICD-10-CM | POA: Diagnosis present

## 2021-04-16 DIAGNOSIS — Z79899 Other long term (current) drug therapy: Secondary | ICD-10-CM | POA: Diagnosis not present

## 2021-04-16 DIAGNOSIS — Z888 Allergy status to other drugs, medicaments and biological substances status: Secondary | ICD-10-CM

## 2021-04-16 LAB — CBC WITH DIFFERENTIAL/PLATELET
Abs Immature Granulocytes: 0.04 10*3/uL (ref 0.00–0.07)
Basophils Absolute: 0 10*3/uL (ref 0.0–0.1)
Basophils Relative: 0 %
Eosinophils Absolute: 0.2 10*3/uL (ref 0.0–0.5)
Eosinophils Relative: 2 %
HCT: 46.6 % (ref 39.0–52.0)
Hemoglobin: 16.2 g/dL (ref 13.0–17.0)
Immature Granulocytes: 0 %
Lymphocytes Relative: 19 %
Lymphs Abs: 2 10*3/uL (ref 0.7–4.0)
MCH: 31 pg (ref 26.0–34.0)
MCHC: 34.8 g/dL (ref 30.0–36.0)
MCV: 89.3 fL (ref 80.0–100.0)
Monocytes Absolute: 0.7 10*3/uL (ref 0.1–1.0)
Monocytes Relative: 7 %
Neutro Abs: 7.4 10*3/uL (ref 1.7–7.7)
Neutrophils Relative %: 72 %
Platelets: 305 10*3/uL (ref 150–400)
RBC: 5.22 MIL/uL (ref 4.22–5.81)
RDW: 12.5 % (ref 11.5–15.5)
WBC: 10.3 10*3/uL (ref 4.0–10.5)
nRBC: 0 % (ref 0.0–0.2)

## 2021-04-16 LAB — COMPREHENSIVE METABOLIC PANEL
ALT: 22 U/L (ref 0–44)
AST: 22 U/L (ref 15–41)
Albumin: 4.1 g/dL (ref 3.5–5.0)
Alkaline Phosphatase: 74 U/L (ref 38–126)
Anion gap: 11 (ref 5–15)
BUN: 5 mg/dL — ABNORMAL LOW (ref 6–20)
CO2: 25 mmol/L (ref 22–32)
Calcium: 8.9 mg/dL (ref 8.9–10.3)
Chloride: 103 mmol/L (ref 98–111)
Creatinine, Ser: 0.82 mg/dL (ref 0.61–1.24)
GFR, Estimated: >60 mL/min (ref >60)
Glucose, Bld: 86 mg/dL (ref 70–99)
Potassium: 3.4 mmol/L — ABNORMAL LOW (ref 3.5–5.1)
Sodium: 139 mmol/L (ref 135–145)
Total Bilirubin: 0.6 mg/dL (ref 0.3–1.2)
Total Protein: 6.9 g/dL (ref 6.5–8.1)

## 2021-04-16 LAB — RAPID URINE DRUG SCREEN, HOSP PERFORMED
Amphetamines: NOT DETECTED
Barbiturates: NOT DETECTED
Benzodiazepines: NOT DETECTED
Cocaine: NOT DETECTED
Opiates: NOT DETECTED
Tetrahydrocannabinol: POSITIVE — AB

## 2021-04-16 LAB — ACETAMINOPHEN LEVEL
Acetaminophen (Tylenol), Serum: <10 ug/mL — ABNORMAL LOW (ref 10–30)
Acetaminophen (Tylenol), Serum: <10 ug/mL — ABNORMAL LOW (ref 10–30)

## 2021-04-16 LAB — RESP PANEL BY RT-PCR (FLU A&B, COVID) ARPGX2
Influenza A by PCR: NEGATIVE
Influenza B by PCR: NEGATIVE
SARS Coronavirus 2 by RT PCR: NEGATIVE

## 2021-04-16 LAB — SALICYLATE LEVEL: Salicylate Lvl: <7 mg/dL — ABNORMAL LOW (ref 7.0–30.0)

## 2021-04-16 LAB — ETHANOL: Alcohol, Ethyl (B): <10 mg/dL (ref <10)

## 2021-04-16 MED ORDER — LACTATED RINGERS IV SOLN: INTRAVENOUS | Status: DC

## 2021-04-16 MED ORDER — CHLORHEXIDINE GLUCONATE CLOTH 2 % EX PADS
6.0000 | MEDICATED_PAD | Freq: Every day | CUTANEOUS | Status: DC
  Administered 2021-04-16: 6 via TOPICAL

## 2021-04-16 MED ORDER — LACTATED RINGERS IV BOLUS
1000.0000 mL | Freq: Once | INTRAVENOUS | Status: AC
  Administered 2021-04-16: 1000 mL via INTRAVENOUS

## 2021-04-16 MED ORDER — PANTOPRAZOLE SODIUM 40 MG IV SOLR
40.0000 mg | Freq: Every day | INTRAVENOUS | Status: DC
  Administered 2021-04-16 – 2021-04-17 (×2): 40 mg via INTRAVENOUS
  Filled 2021-04-16 (×2): qty 40

## 2021-04-16 MED ORDER — DOCUSATE SODIUM 100 MG PO CAPS: 100.0000 mg | ORAL_CAPSULE | Freq: Two times a day (BID) | ORAL | Status: DC | PRN

## 2021-04-16 MED ORDER — LEVETIRACETAM IN NACL 1000 MG/100ML IV SOLN
1000.0000 mg | Freq: Once | INTRAVENOUS | Status: AC
  Administered 2021-04-16: 1000 mg via INTRAVENOUS
  Filled 2021-04-16: qty 100

## 2021-04-16 MED ORDER — ENOXAPARIN SODIUM 40 MG/0.4ML IJ SOSY
40.0000 mg | PREFILLED_SYRINGE | INTRAMUSCULAR | Status: DC
  Administered 2021-04-16 – 2021-04-17 (×2): 40 mg via SUBCUTANEOUS
  Filled 2021-04-16 (×2): qty 0.4

## 2021-04-16 MED ORDER — LORAZEPAM 2 MG/ML IJ SOLN: 2.0000 mg | Freq: Once | INTRAMUSCULAR | Status: AC

## 2021-04-16 MED ORDER — POTASSIUM CHLORIDE 10 MEQ/100ML IV SOLN
10.0000 meq | INTRAVENOUS | Status: AC
  Administered 2021-04-16 (×2): 10 meq via INTRAVENOUS
  Filled 2021-04-16 (×2): qty 100

## 2021-04-16 MED ORDER — POLYETHYLENE GLYCOL 3350 17 G PO PACK: 17.0000 g | PACK | Freq: Every day | ORAL | Status: DC | PRN

## 2021-04-16 MED ORDER — LORAZEPAM 2 MG/ML IJ SOLN
INTRAMUSCULAR | Status: AC
  Administered 2021-04-16: 2 mg via INTRAVENOUS
  Filled 2021-04-16: qty 1

## 2021-04-16 NOTE — Consult Note (Signed)
[facility] Face-to-Face Psychiatry Consult   Reason for Consult: Suicide attempt by overdose on 60-20 mg fluoxetine tablets Referring Physician: Dr. 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 male patient admitted with suicide attempt by overdose.  Patient's mother Edris Friedt is at the bedside, consent is obtained to speak in complete psychiatric evaluation while mother is present.  Patient admits to reported suicide attempt via overdose approximately 2 hours prior to presenting to the emergency department.  Patient states" I took a whole lot of pills."  Patient further reports he overdosed on about 6020 mg Prozac tablets, in a suicide attempt and cut his wrists.  Physical evaluation does reveal multiple superficial lacerations to the left forearm, different stages of healing.  Patient states he has been dealing with worsening depression, suicidal thoughts for quite some time.  Patient's mother interjected multiple times throughout the psychiatric evaluation, and was able to provide clarifying information.  Patient continued to provide vague answers, and appeared to be responding to internal stimuli.  There were several times throughout the evaluation when he was observed to be grinning, smiling, and looking off into the distance.  When seeking clarification on his behaviors he states" I feel like I am having a seizure. "  He continues to smile angry and, and exhibit involuntary movements.   Although he is alert and oriented, able to have a linear conversation some of the evaluation he was confused.  Patient reports a previous diagnosis of depression, severe cluster B traits.  He is unable to provide much previous psychiatric history, and mother was able to feel plan.  Patient has had about 5-6 suicide attempts of high lethality, all of which resulted in medical admission to the hospital.  He is currently being managed outpatient by.[name] Kitchen  He is currently receiving Abilify Maintena, last injection received on 04/09/2021, and taking fluoxetine 40 mg p.o. daily.  Mother reports his last inpatient admission was at [facility] on May 2022, followed by December 2021.  She states patient does have a history of drug-seeking behaviors, and likes to be administered sedative and oral antibiotics and latex.  Mother also reports she went grocery shopping yesterday and bought him 10 Monster drinks, in which there were all empty upon arrival to his apartment this afternoon with EMS.  Patient endorses intermittent use of marijuana, last use this morning.  He denies any other illicit substances, and or alcohol use.  He states he is currently employed at [employer], and is not attending school at this time.  Patient is going to require inpatient psychiatric admission, once medically stable.  HPI:  PT arrives via EMS from home. Pt took a total of 60 Prozac 20 mg tablets and cut himself on his left arm about 1 hour ago. Bleeding currently controlled and dressing place by ems. Pt states he has been having thoughts of suicide for about a year but worsened over the last week.  Alert and oriented x 4  Past Psychiatric History: MDD, recurrent, severe, Substance induced psychosis, cannabis use disorder.  Currently being  seen outpatient by Dr. Jonny Ruiz at [facility].  Last hospitalization at [facility] behavioral health in May 2022, December 2021, March 2021, and July 2020.  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 No pertinent surgical history. Family History:  Family History  Problem Relation Age of Onset   Allergic rhinitis Neg Hx    Asthma Neg Hx    Eczema Neg Hx    Urticaria Neg Hx    Family Psychiatric  History: Per mother there is a strong family history for depression and bipolar on the maternal and paternal side.  Mother also reports maternal great aunt completed suicide.  Social History:  Social History   Substance and Sexual Activity  Alcohol Use Not Currently     Social History   Substance and Sexual Activity  Drug Use Not Currently   Types: Cocaine, LSD, Marijuana, IV    Social History   Socioeconomic History   Marital status: Single    Spouse name: Not on file   Number of children: Not on file   Years of education: Not on file   Highest education level: Not on file  Occupational History   Not on file  Tobacco Use   Smoking status: Every Day    Types: Cigarettes   Smokeless tobacco: Never  Vaping Use   Vaping Use: Never used  Substance and Sexual Activity   Alcohol use: Not Currently   Drug use: Not Currently    Types: Cocaine, LSD, Marijuana, IV   Sexual activity: Not on file  Other Topics Concern   Not on file  Social History Narrative   ** Merged History Encounter **       Social Determinants of Health   Financial Resource Strain: Not on file  Food Insecurity: Not on file  Transportation Needs: Not on file  Physical Activity: Not on file  Stress: Not on file  Social Connections: Not on file   Additional Social History:    Allergies:   Allergies  Allergen Reactions   Penicillins Other (See Comments), Anaphylaxis  and Swelling    Reaction not recalled, but he IS allergic Did it involve swelling of the face/tongue/throat, SOB, or low BP? Unk Did it involve sudden or severe rash/hives, skin peeling, or any reaction on the inside of your mouth or nose? Unk Did you need to seek medical attention at a hospital or doctor's office? Unk When did it last happen? Childhood If all above answers are "NO", may proceed with cephalosporin use.  Reaction not recalled, but he IS allergic Did it involve swelling of the face/tongue/throat, SOB, or low BP? Unk Did it involve sudden or severe rash/hives, skin peeling, or any reaction on the inside of your mouth or nose? Unk Did you need to seek medical attention at a hospital or doctor's office? Unk When did it last happen? Childhood If all above answers are "NO", may proceed with cephalosporin use.   Sulfa Antibiotics Anaphylaxis, Shortness Of Breath, Swelling and Rash    Throat swells   Prozac [Fluoxetine Hcl] Other (See Comments)    Makes mean   Bactrim [Sulfamethoxazole-Trimethoprim] Rash    Labs: No results  found for this or any previous visit (from the past 48 hour(s)).  Current Facility-Administered Medications  Medication Dose Route Frequency Provider Last Rate Last Admin   lactated ringers infusion   Intravenous Continuous Lacretia Leigh, MD 125 mL/hr at 04/16/21 1433 New Bag at 04/16/21 1433   Current Outpatient Medications  Medication Sig Dispense Refill   ARIPiprazole (ABILIFY) 10 MG tablet Take 1 tablet (10 mg total) by mouth daily. (Patient not taking: Reported on 04/16/2021) 30 tablet 0   escitalopram (LEXAPRO) 10 MG tablet Take 1 tablet (10 mg total) by mouth daily. 30 tablet 0   gabapentin (NEURONTIN) 100 MG capsule Take 1 capsule (100 mg total) by mouth 3 (three) times daily. 90 capsule 0   hydrOXYzine (ATARAX/VISTARIL) 50 MG tablet Take 1 tablet (50 mg total) by mouth daily as needed for anxiety. 30 tablet 0    Musculoskeletal: Strength &  Muscle Tone: increased Gait & Station: unsteady Patient leans: Front            Psychiatric Specialty Exam:  Presentation  General Appearance: Bizarre  Eye Contact:Good  Speech:Slow  Speech Volume:Normal  Handedness:Right   Mood and Affect  Mood:Euphoric  Affect:Full Range; Congruent   Thought Process  Thought Processes:Coherent; Linear; Irrevelant  Descriptions of Associations:Intact (delayed)  Orientation:Full (Time, Place and Person)  Thought Content:Illogical; Scattered; Logical  History of Schizophrenia/Schizoaffective disorder:Yes  Duration of Psychotic Symptoms:Less than six months  Hallucinations:Hallucinations: None  Ideas of Reference:None  Suicidal Thoughts:Suicidal Thoughts: Yes, Active SI Active Intent and/or Plan: With Intent; With Plan; With Means to [place]; With Access to Means  Homicidal Thoughts:Homicidal Thoughts: No   Sensorium  Memory:Immediate Fair; Recent Fair; Remote Poor  Judgment:Impaired  Insight:Shallow   Executive Functions  Concentration:Poor  Attention Span:Poor  Recall:Poor  Fund of Knowledge:Poor  Language:Good   Psychomotor Activity  Psychomotor Activity:Psychomotor Activity: Increased; Restlessness AIMS Completed?: No   Assets  Assets:Communication Skills; Physical Health; Resilience; Social Support; Housing   Sleep  Sleep:Sleep: Fair   Physical Exam: Physical Exam ROS Blood pressure (!) 113/92, pulse 79, temperature 99.3 F (37.4 C), resp. rate 12, SpO2 98 %. There is no height or weight on file to calculate BMI.  Treatment Plan Summary: Plan patient will require inpatient psychiatric admission once medically stable.  Will likely benefit from dialectical behavioral therapy once he successfully completes his inpatient stay.  Mother reports she will likely place patient under involuntary commitment, as he is refusing to go inpatient.  -Do review side effects and complications with  patient, and what to expect with a serotonin overdose particularly Prozac, and the half-life of 80 hours. Patient will likely be admitted to the medical floor, although his vital signs are currently stable at this time.   -Recommend routine labs, and ongoing telemetry monitoring. -We will place agitation protocol, as patient has a history of irritability, agitation, and aggression.  Also will expect overexcitement secondary to his overdose on fluoxetine.  -Mother has signed release of information form, patient has also signed an agreement to have collaborative discussion with Dr. Jonny Ruiz who has been managing his psychiatric conditions for quite some time. Disposition: Recommend psychiatric Inpatient admission when medically cleared.  Suella Broad, FNP 04/16/2021 3:08 PM

## 2021-04-16 NOTE — Progress Notes (Signed)
eLink Physician-Brief Progress Note Patient Name: Billy Rose DOB: Aug 21, 2000 MRN: [id number]   Date of Service  04/16/2021  HPI/Events of Note  Patient with a history of substance abuse and depression admitted with Fluoxetine overdose, he also had a witnessed seizure in the context of the over dose.  eICU Interventions  New Patient Evaluation.        Thomasene Lot Trixie Maclaren 04/16/2021, 8:52 PM

## 2021-04-16 NOTE — ED Provider Notes (Signed)
[facility]-EMERGENCY DEPT Provider Note   CSN: [id number] Arrival date & time: 04/16/21  1349     History Chief Complaint  Patient presents with   Suicide Attempt   Drug Overdose    Billy Rose is a 20 y.o. male.  Two 20-year-old male with history of schizophrenia as well as bipolar disorder presents after intentional overdose of 62 mg Prozac tablets.  Admits that this was a suicide attempt.  Does have history of same.  Notes that he has had increasing depression recently.  Does admit to auditory hallucinations but would not specify what they are saying.  Denies any other coingestions.  Ingestion occurred approximately an hour prior to arrival and was not associate with emesis.  Also did cut himself superficially on his left forearm.  No tendon involvement appreciated.  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 No pertinent surgical history.     Family  History  Problem Relation Age of Onset   Allergic rhinitis Neg Hx    Asthma Neg Hx    Eczema Neg Hx    Urticaria Neg Hx     Social History   Tobacco Use   Smoking status: Every Day    Types: Cigarettes   Smokeless tobacco: Never  Vaping Use   Vaping Use: Never used  Substance Use Topics   Alcohol use: Not Currently   Drug use: Not Currently    Types: Cocaine, LSD, Marijuana, IV    Home Medications Prior to Admission medications   Medication Sig Start Date End Date Taking? Authorizing Provider  ARIPiprazole (ABILIFY) 10 MG tablet Take 1 tablet (10 mg total) by mouth daily. 10/14/20   Laveda AbbeParks, Laurie Britton, NP  escitalopram (LEXAPRO) 10 MG tablet Take 1 tablet (10 mg total) by mouth daily. 10/14/20   Laveda AbbeParks, Laurie Britton, NP  gabapentin (NEURONTIN) 100 MG capsule Take 1 capsule (100 mg total) by mouth 3 (three) times daily. 10/14/20 11/13/20  Laveda AbbeParks, Laurie Britton, NP  hydrOXYzine (ATARAX/VISTARIL) 50 MG tablet Take 1 tablet (50 mg total) by mouth daily as needed for anxiety. 10/14/20   Laveda AbbeParks, Laurie Britton, NP    Allergies    Penicillins, Sulfa antibiotics, Prozac [fluoxetine hcl], and Bactrim [sulfamethoxazole-trimethoprim]  Review of Systems   Review of Systems  All other systems reviewed and are negative.  Physical Exam Updated Vital Signs BP (!) 128/91 (BP Location: Left Arm)   Pulse (!) 101   Temp 99.3 F (37.4 C)   Resp 17   SpO2 99%  Physical Exam Vitals and nursing note reviewed.  Constitutional:      General: He is not in acute distress.    Appearance: Normal appearance. He is well-developed. He is not toxic-appearing.  HENT:     Head: Normocephalic and atraumatic.  Eyes:     General: Lids are normal.     Conjunctiva/sclera: Conjunctivae normal.     Pupils: Pupils are equal, round, and reactive to light.  Neck:     Thyroid: No thyroid mass.     Trachea: No tracheal deviation.  Cardiovascular:     Rate and Rhythm: Normal rate and regular rhythm.      Heart sounds: Normal heart sounds. No murmur heard.   No gallop.  Pulmonary:     Effort: Pulmonary effort is normal. No respiratory distress.     Breath sounds: Normal breath sounds. No stridor. No decreased breath sounds, wheezing, rhonchi or rales.  Abdominal:     General: There is no distension.     Palpations: Abdomen is soft.     Tenderness: There is no abdominal tenderness. There is no rebound.  Musculoskeletal:        General: No tenderness. Normal range of motion.     Cervical back: Normal range of motion and neck supple.  Skin:    General: Skin is warm and dry.     Findings: No abrasion or rash.  Neurological:     Mental Status: He is alert and oriented to person, place, and time. Mental status is at baseline.     GCS: GCS eye subscore is 4. GCS verbal subscore is 5. GCS motor subscore is 6.     Cranial Nerves: No cranial nerve deficit.     Sensory: No sensory deficit.     Motor: Motor function is intact.  Psychiatric:        Attention and Perception: Attention normal. He perceives auditory hallucinations.        Mood and Affect: Mood is depressed. Affect is inappropriate.        Speech: Speech is delayed.        Behavior: Behavior is slowed.        Thought Content: Thought content includes suicidal ideation. Thought content includes suicidal plan.        Judgment: Judgment is impulsive.    ED Results / Procedures / Treatments   Labs (all labs ordered are listed, but only abnormal results are displayed) Labs Reviewed  RESP PANEL BY RT-PCR (FLU A&B, COVID) [id number]  ETHANOL  RAPID URINE DRUG SCREEN, HOSP PERFORMED  SALICYLATE LEVEL  ACETAMINOPHEN LEVEL  CBC WITH DIFFERENTIAL/PLATELET  COMPREHENSIVE METABOLIC PANEL    EKG None  Radiology No results found.  Procedures Procedures   Medications Ordered in ED Medications  lactated ringers infusion (has no administration in time range)    ED Course  I have reviewed the triage vital signs and the nursing  notes.  Pertinent labs & imaging results that were available during my care of the patient were reviewed by me and considered in my medical decision making (see chart for details).    MDM Rules/Calculators/A&P                           Patient monitored here and initially did well until he had a generalized tonic-clonic seizure that responded well to Ativan.  Patient will require admission to ICU.  Discussed with intensivist will come and see the patient  CRITICAL CARE Performed  by: Toy Baker Total critical care time: 45 minutes Critical care time was exclusive of separately billable procedures and treating other patients. Critical care was necessary to treat or prevent imminent or life-threatening deterioration. Critical care was time spent personally by me on the following activities: development of treatment plan with patient and/or surrogate as well as nursing, discussions with consultants, evaluation of patient's response to treatment, examination of patient, obtaining history from patient or surrogate, ordering and performing treatments and interventions, ordering and review of laboratory studies, ordering and review of radiographic studies, pulse oximetry and re-evaluation of patient's condition.  Final Clinical Impression(s) / ED Diagnoses Final diagnoses:  None    Rx / DC Orders ED Discharge Orders     None        Lorre Nick, MD 04/16/21 1552

## 2021-04-16 NOTE — Plan of Care (Signed)
  Problem: Education: Goal: Knowledge of General Education information will improve Description: Including pain rating scale, medication(s)/side effects and non-pharmacologic comfort measures 04/16/2021 2123 by Luanna Cole, RN Outcome: Progressing 04/16/2021 2123 by Luanna Cole, RN Outcome: Progressing   Problem: Health Behavior/Discharge Planning: Goal: Ability to manage health-related needs will improve 04/16/2021 2123 by Luanna Cole, RN Outcome: Progressing 04/16/2021 2123 by Luanna Cole, RN Outcome: Progressing   Problem: Clinical Measurements: Goal: Ability to maintain clinical measurements within normal limits will improve 04/16/2021 2123 by Luanna Cole, RN Outcome: Progressing 04/16/2021 2123 by Luanna Cole, RN Outcome: Progressing Goal: Will remain free from infection 04/16/2021 2123 by Luanna Cole, RN Outcome: Progressing 04/16/2021 2123 by Luanna Cole, RN Outcome: Progressing Goal: Diagnostic test results will improve 04/16/2021 2123 by Luanna Cole, RN Outcome: Progressing 04/16/2021 2123 by Luanna Cole, RN Outcome: Progressing Goal: Respiratory complications will improve 04/16/2021 2123 by Luanna Cole, RN Outcome: Progressing 04/16/2021 2123 by Luanna Cole, RN Outcome: Progressing Goal: Cardiovascular complication will be avoided 04/16/2021 2123 by Luanna Cole, RN Outcome: Progressing 04/16/2021 2123 by Luanna Cole, RN Outcome: Progressing   Problem: Activity: Goal: Risk for activity intolerance will decrease 04/16/2021 2123 by Luanna Cole, RN Outcome: Progressing 04/16/2021 2123 by Luanna Cole, RN Outcome: Progressing   Problem: Nutrition: Goal: Adequate nutrition will be maintained 04/16/2021 2123 by Luanna Cole, RN Outcome: Progressing 04/16/2021 2123 by Luanna Cole, RN Outcome: Progressing   Problem: Coping: Goal: Level of anxiety will decrease 04/16/2021 2123 by Luanna Cole, RN Outcome: Progressing 04/16/2021 2123 by Luanna Cole, RN Outcome: Progressing   Problem: Elimination: Goal: Will not experience complications related to bowel motility 04/16/2021 2123 by Luanna Cole, RN Outcome: Progressing 04/16/2021 2123 by Luanna Cole, RN Outcome: Progressing Goal: Will not experience complications related to urinary retention 04/16/2021 2123 by Luanna Cole, RN Outcome: Progressing 04/16/2021 2123 by Luanna Cole, RN Outcome: Progressing   Problem: Pain Managment: Goal: General experience of comfort will improve 04/16/2021 2123 by Luanna Cole, RN Outcome: Progressing 04/16/2021 2123 by Luanna Cole, RN Outcome: Progressing   Problem: Safety: Goal: Ability to remain free from injury will improve 04/16/2021 2123 by Luanna Cole, RN Outcome: Progressing 04/16/2021 2123 by Luanna Cole, RN Outcome: Progressing   Problem: Skin Integrity: Goal: Risk for impaired skin integrity will decrease 04/16/2021 2123 by Luanna Cole, RN Outcome: Progressing 04/16/2021 2123 by Luanna Cole, RN Outcome: Progressing   Problem: Education: Goal: Knowledge of warning signs, risks, and behaviors that relate to suicide ideation and self-harm behaviors will improve Outcome: Progressing   Problem: Health Behavior/Discharge (Transition) Planning: Goal: Ability to manage health-related needs will improve Outcome: Progressing   Problem: Clinical Measurements: Goal: Remain free from any harm during hospitalization Outcome: Progressing   Problem: Nutrition: Goal: Adequate fluids and nutrition will be maintained Outcome: Progressing   Problem: Coping: Goal: Ability to disclose and discuss thoughts of suicide and self-harm will improve Outcome: Progressing   Problem: Medication Management: Goal: Adhere to prescribed medication regimen Outcome: Progressing   Problem: Sleep Hygiene: Goal: Ability to obtain adequate restful  sleep will improve Outcome: Progressing   Problem: Self Esteem: Goal: Ability to verbalize positive feeling about self will improve Outcome: Progressing

## 2021-04-16 NOTE — ED Notes (Signed)
Pt had a seizure that lasted approximately 1 minute. Pt currently appears to be in a post-ictal state. Seizure precautions initiated. Pt remains on full monitor, currently maintaining airway

## 2021-04-16 NOTE — ED Notes (Signed)
PT HAS 2 BELONGINGS BAGS IN THE CABINET BEHIND THE DESK

## 2021-04-16 NOTE — ED Notes (Signed)
All patient belongings given to mother per patient's request

## 2021-04-16 NOTE — ED Triage Notes (Signed)
PT arrives via EMS from home. Pt took a total of 60 Prozac 20 mg tablets and cut himself on his left arm about 1 hour ago. Bleeding currently controlled and dressing place by ems. Pt states he has been having thoughts of suicide for about a year but worsened over the last week.  Alert and oriented x 4

## 2021-04-16 NOTE — ED Notes (Signed)
Poison control called, tylenol levels within limit. No need to redraw at this time. Stated no concerns with any reported labs.

## 2021-04-16 NOTE — ED Notes (Signed)
ED TO INPATIENT HANDOFF REPORT  ED Nurse Name and Phone #:   S Name/Age/Gender Billy Rose 20 y.o. male Room/Bed: RESB/RESB  Code Status   Code Status: Full Code  Home/SNF/Other Home Patient oriented to: self, place, time, and situation Is this baseline? Yes   Triage Complete: Triage complete  Chief Complaint Intentional overdose of selective serotonin reuptake inhibitor (SSRI) (HCC) [T43.222A]  Triage Note PT arrives via EMS from home. Pt took a total of 60 Prozac 20 mg tablets and cut himself on his left arm about 1 hour ago. Bleeding currently controlled and dressing place by ems. Pt states he has been having thoughts of suicide for about a year but worsened over the last week.  Alert and oriented x 4   Allergies Allergies  Allergen Reactions   Penicillins Other (See Comments), Anaphylaxis and Swelling    Reaction not recalled, but he IS allergic Did it involve swelling of the face/tongue/throat, SOB, or low BP? Unk Did it involve sudden or severe rash/hives, skin peeling, or any reaction on the inside of your mouth or nose? Unk Did you need to seek medical attention at a hospital or doctor's office? Unk When did it last happen? Childhood If all above answers are "NO", may proceed with cephalosporin use.  Reaction not recalled, but he IS allergic Did it involve swelling of the face/tongue/throat, SOB, or low BP? Unk Did it involve sudden or severe rash/hives, skin peeling, or any reaction on the inside of your mouth or nose? Unk Did you need to seek medical attention at a hospital or doctor's office? Unk When did it last happen? Childhood If all above answers are "NO", may proceed with cephalosporin use.   Sulfa Antibiotics Anaphylaxis, Shortness Of Breath, Swelling and Rash    Throat swells   Prozac [Fluoxetine Hcl] Other (See Comments)    Makes mean   Bactrim [Sulfamethoxazole-Trimethoprim] Rash    Level of Care/Admitting Diagnosis ED Disposition     ED  Disposition  Admit   Condition  --   Comment  [facility] [[id number]]  Level of Care: ICU [6]  [facility] if equivalent level of care is available:: Yes  Covid Evaluation: Confirmed COVID Negative  Diagnosis: Intentional overdose of selective serotonin reuptake inhibitor (SSRI) [facility](HCC) [[id number]][[id number]]  Admitting Physician: Karren BurlyHUNSUCKER, MATTHEW R [phone][[id number]]  Attending Physician: Karren BurlyHUNSUCKER, MATTHEW R [phone][[id number]]  Estimated length of stay: 5 - 7 days  Certification:: I certify this patient will need inpatient services for at least 2 midnights          B Medical/Surgery History Past Medical History:  Diagnosis Date   Headache    MDD (major depressive disorder), recurrent, severe, with psychosis (HCC)    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 No pertinent surgical history.   A IV Location/Drains/Wounds Patient Lines/Drains/Airways Status     Active Line/Drains/Airways     Name Placement date Placement time Site Days   Peripheral IV 04/16/21 20 G Anterior;Distal;Right;Upper Arm 04/16/21  1350  Arm  less than 1            Intake/Output Last 24 hours  Intake/Output Summary (Last 24 hours) at 04/16/2021 1922 Last data filed at 04/16/2021 1736 Gross per 24 hour  Intake 381.25 ml  Output --  Net 381.25 ml    Labs/Imaging Results for orders placed or performed during the hospital encounter  of 04/16/21 (from the past 48 hour(s))  CBC with Differential/Platelet     Status: None   Collection Time: 04/16/21  2:03 PM  Result Value Ref Range   WBC 10.3 4.0 - 10.5 K/uL   RBC 5.22 4.22 - 5.81 MIL/uL   Hemoglobin 16.2 13.0 - 17.0 g/dL   HCT 73.7 10.6 - 26.9 %   MCV 89.3 80.0 - 100.0 fL   MCH 31.0 26.0 - 34.0 pg   MCHC 34.8 30.0 - 36.0 g/dL   RDW 48.5 46.2 - 70.3 %   Platelets 305 150 - 400 K/uL   nRBC 0.0 0.0 - 0.2 %   Neutrophils Relative % 72 %    Neutro Abs 7.4 1.7 - 7.7 K/uL   Lymphocytes Relative 19 %   Lymphs Abs 2.0 0.7 - 4.0 K/uL   Monocytes Relative 7 %   Monocytes Absolute 0.7 0.1 - 1.0 K/uL   Eosinophils Relative 2 %   Eosinophils Absolute 0.2 0.0 - 0.5 K/uL   Basophils Relative 0 %   Basophils Absolute 0.0 0.0 - 0.1 K/uL   Immature Granulocytes 0 %   Abs Immature Granulocytes 0.04 0.00 - 0.07 K/uL    Comment: Performed at [facility], [address], [place], [place] [address]  Resp Panel by RT-PCR (Flu A&B, Covid) Nasopharyngeal Swab     Status: None   Collection Time: 04/16/21  2:03 PM   Specimen: Nasopharyngeal Swab; Nasopharyngeal(NP) swabs in vial transport medium  Result Value Ref Range   SARS Coronavirus 2 by RT PCR NEGATIVE NEGATIVE    Comment: (NOTE) SARS-CoV-2 target nucleic acids are NOT DETECTED.  The SARS-CoV-2 RNA is generally detectable in upper respiratory specimens during the acute phase of infection. The lowest concentration of SARS-CoV-2 viral copies this assay can detect is 138 copies/mL. A negative result does not preclude SARS-Cov-2 infection and should not be used as the sole basis for treatment or other patient management decisions. A negative result may occur with  improper specimen collection/handling, submission of specimen other than nasopharyngeal swab, presence of viral mutation(s) within the areas targeted by this assay, and inadequate number of viral copies(<138 copies/mL). A negative result must be combined with clinical observations, patient history, and epidemiological information. The expected result is Negative.  Fact Sheet for Patients:  BloggerCourse.com  Fact Sheet for Healthcare Providers:  SeriousBroker.it  This test is no t yet approved or cleared by the [place] FDA and  has been authorized for detection and/or diagnosis of SARS-CoV-2 by FDA under an Emergency Use Authorization (EUA). This EUA  will remain  in effect (meaning this test can be used) for the duration of the COVID-19 declaration under Section 564(b)(1) of the Act, 21 U.S.C.section 360bbb-3(b)(1), unless the authorization is terminated  or revoked sooner.       Influenza A by PCR NEGATIVE NEGATIVE   Influenza B by PCR NEGATIVE NEGATIVE    Comment: (NOTE) The Xpert Xpress SARS-CoV-2/FLU/RSV plus assay is intended as an aid in the diagnosis of influenza from Nasopharyngeal swab specimens and should not be used as a sole basis for treatment. Nasal washings and aspirates are unacceptable for Xpert Xpress SARS-CoV-2/FLU/RSV testing.  Fact Sheet for Patients: BloggerCourse.com  Fact Sheet for Healthcare Providers: SeriousBroker.it  This test is not yet approved or cleared by the [place] FDA and has been authorized for detection and/or diagnosis of SARS-CoV-2 by FDA under an Emergency Use Authorization (EUA). This EUA will remain in effect (meaning this test  can be used) for the duration of the COVID-19 declaration under Section 564(b)(1) of the Act, 21 U.S.C. section 360bbb-3(b)(1), unless the authorization is terminated or revoked.  Performed at [facility], [place] [address], [place], [place] [address]   Comprehensive metabolic panel     Status: Abnormal   Collection Time: 04/16/21  3:45 PM  Result Value Ref Range   Sodium 139 135 - 145 mmol/L   Potassium 3.4 (L) 3.5 - 5.1 mmol/L   Chloride 103 98 - 111 mmol/L   CO2 25 22 - 32 mmol/L   Glucose, Bld 86 70 - 99 mg/dL    Comment: Glucose reference range applies only to samples taken after fasting for at least 8 hours.   BUN 5 (L) 6 - 20 mg/dL   Creatinine, Ser 0.82 0.61 - 1.24 mg/dL   Calcium 8.9 8.9 - 10.3 mg/dL   Total Protein 6.9 6.5 - 8.1 g/dL   Albumin 4.1 3.5 - 5.0 g/dL   AST 22 15 - 41 U/L   ALT 22 0 - 44 U/L   Alkaline Phosphatase 74 38 - 126 U/L   Total Bilirubin 0.6 0.3  - 1.2 mg/dL   GFR, Estimated >60 >60 mL/min    Comment: (NOTE) Calculated using the CKD-EPI Creatinine Equation (2021)    Anion gap 11 5 - 15    Comment: Performed at [facility], [place] [address], [place], [place] [address]  Acetaminophen level     Status: Abnormal   Collection Time: 04/16/21  3:48 PM  Result Value Ref Range   Acetaminophen (Tylenol), Serum <10 (L) 10 - 30 ug/mL    Comment: (NOTE) Therapeutic concentrations vary significantly. A range of 10-30 ug/mL  may be an effective concentration for many patients. However, some  are best treated at concentrations outside of this range. Acetaminophen concentrations >150 ug/mL at 4 hours after ingestion  and >50 ug/mL at 12 hours after ingestion are often associated with  toxic reactions.  Performed at [facility], [place] [address] [place], [place] [address]   Ethanol     Status: None   Collection Time: 04/16/21  3:48 PM  Result Value Ref Range   Alcohol, Ethyl (B) <10 <10 mg/dL    Comment: (NOTE) Lowest detectable limit for serum alcohol is 10 mg/dL.  For medical purposes only. Performed at [facility], [place] [address], [place], [place] [id number]   Salicylate level     Status: Abnormal   Collection Time: 04/16/21  3:48 PM  Result Value Ref Range   Salicylate Lvl [id number] (L) 7.0 - 30.0 mg/dL    Comment: Performed at [facility], [place] [address], [place], [place] [address]  Acetaminophen level     Status: Abnormal   Collection Time: 04/16/21  5:00 PM  Result Value Ref Range   Acetaminophen (Tylenol), Serum <10 (L) 10 - 30 ug/mL    Comment: (NOTE) Therapeutic concentrations vary significantly. A range of 10-30 ug/mL  may be an effective concentration for many patients. However, some  are best treated at concentrations outside of this range. Acetaminophen concentrations >150 ug/mL at 4 hours after ingestion  and >50 ug/mL at 12 hours after  ingestion are often associated with  toxic reactions.  Performed at [facility], [place] [address] [place], [place] [address]    No results found.  Pending Labs [employer] (From admission, onward)     Start     Ordered  04/23/21 0500  Creatinine, serum  (enoxaparin (LOVENOX)    CrCl >/= 30 ml/min)  Weekly,   R     Comments: while on enoxaparin therapy    04/16/21 1643   04/17/21 0500  CBC  Tomorrow morning,   R        04/16/21 1643   04/17/21 [id number]  Basic metabolic panel  Tomorrow morning,   R        04/16/21 1643   04/17/21 0500  Magnesium  Tomorrow morning,   R        04/16/21 1643   04/17/21 0500  Phosphorus  Tomorrow morning,   R        04/16/21 1643   04/16/21 1403  Rapid urine drug screen (hospital performed)  ONCE - STAT,   STAT        04/16/21 1402            Vitals/Pain Today's Vitals   04/16/21 1811 04/16/21 1815 04/16/21 1830 04/16/21 1900  BP:  126/88 120/82 119/85  Pulse:  86 92 86  Resp:  (!) 23 19 (!) 24  Temp:      SpO2:  99% 99% 96%  PainSc: 0-No pain       Isolation Precautions No active isolations  Medications Medications  lactated ringers infusion (0 mLs Intravenous Stopped 04/16/21 1736)  docusate sodium (COLACE) capsule 100 mg (has no administration in time range)  polyethylene glycol (MIRALAX / GLYCOLAX) packet 17 g (has no administration in time range)  enoxaparin (LOVENOX) injection 40 mg (has no administration in time range)  pantoprazole (PROTONIX) injection 40 mg (has no administration in time range)  levETIRAcetam (KEPPRA) IVPB 1000 mg/100 mL premix (1,000 mg Intravenous New Bag/Given 04/16/21 1920)  LORazepam (ATIVAN) injection 2 mg (2 mg Intravenous Given 04/16/21 1542)  potassium chloride 10 mEq in 100 mL IVPB (0 mEq Intravenous Stopped 04/16/21 1914)  lactated ringers bolus 1,000 mL (0 mLs Intravenous Stopped 04/16/21 1922)    Mobility walks     Focused Assessments     R Recommendations: See  Admitting Provider Note  Report given to:   Additional Notes:

## 2021-04-16 NOTE — Progress Notes (Signed)
eLink Physician-Brief Progress Note Patient Name: Billy Rose DOB: 03-09-2001 MRN: [id number]   Date of Service  04/16/2021  HPI/Events of Note  BP 85/50, MAP 59, HR 80.  eICU Interventions  LR 1000 ml iv fluid bolus ordered to be administered over two hours.        Thomasene Lot Maleni Seyer 04/16/2021, 10:35 PM

## 2021-04-16 NOTE — H&P (Signed)
NAMEShia Rose, MRN:  [id number], DOB:  01/01/2001, LOS: 0 ADMISSION DATE:  04/16/2021, CONSULTATION DATE:  11/15 REFERRING MD:  Dr. Zenia Resides Rose, CHIEF COMPLAINT:  SSRI overdose   History of Present Illness:  20 year old male with PMH as below, which is significant for major depressive disorder and substance induced psychosis. He is encephalopathic and post ictal; therefore history is obtained from chart review. He reported worsening depression over the preceding 9 months. On 11/15 he attempted suicide by taking #60 20mg  fluoxetine tablets as well as lacerating his anterior wrists.  Admitted to auditory hallucinations. He was being monitored in the emergency department when he suffered a brief seizure (one minute). Post ictally he was lethargic and PCCM was asked to admit to ICU.   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   Significant Hospital Events: Including procedures, antibiotic start and stop dates in addition to other pertinent events     Interim History / Subjective:    Objective   Blood pressure 138/85, pulse (!) 131, temperature 99.3 F (37.4 C), resp. rate (!) 25, SpO2 94 %.       No intake or output data in the 24 hours ending 04/16/21 1605 There were no vitals filed for this visit.  Examination: General: Young adult male in NAD HENT: [place]/AT, PERRL, no JVD Lungs: Clear bilateral breath sounds Cardiovascular: RRR, no MRG Abdomen: Soft, non-tender, non-distended  Extremities: No acute deformity or ROM limitation. Shallow lacerations to L forearm.  Neuro: Somnolent, but will arouse to answer simple questions .   Resolved Hospital Problem list     Assessment & Plan:   Fluoxetine overdose: intentional overdose, suicide attempt. Patient took #60 20mg  tablets. Complicated by  seizure in the ED, which is considered to be quite a rare reaction. - Admit to ICU for close monitoring of airway and hemodynamics.  - Load with Keppra to hopefully prevent further seizures while fluoxetine is metabolizing.  - Ativan 2mg  PRN for seizure - IVF hydration - Serial EKG to assess QTC - Vital signs per unit protocol including temperature checks - Psych following and recommending psych admission once medically cleared.   Hypokalemia - IV K supplementation. If he becomes more alert could transition to oral.   Best Practice (right click and "Reselect all SmartList Selections" daily)   Diet/type: NPO DVT prophylaxis: LMWH GI prophylaxis: PPI Lines: N/A Foley:  N/A Code Status:  full code Last date of multidisciplinary goals of care discussion []   Labs   CBC: Recent Labs  Lab 04/16/21 1403  WBC 10.3  NEUTROABS 7.4  HGB 16.2  HCT 46.6  MCV 89.3  PLT 123456    Basic Metabolic Panel: No results for input(s): NA, K, CL, CO2, GLUCOSE, BUN, CREATININE, CALCIUM, MG, PHOS in the last 168 hours. GFR: CrCl cannot be calculated (Patient's most recent lab result is older than the maximum 21 days allowed.). Recent Labs  Lab 04/16/21 1403  WBC 10.3    Liver Function Tests: No results for input(s): AST, ALT, ALKPHOS, BILITOT, PROT, ALBUMIN in the last 168 hours. No results for input(s): LIPASE, AMYLASE in the last 168 hours. No results for input(s): AMMONIA in the last 168 hours.  ABG No results found for: PHART, PCO2ART, PO2ART, HCO3, TCO2, ACIDBASEDEF, O2SAT   Coagulation Profile: No results for input(s): INR, PROTIME in  the last 168 hours.  Cardiac Enzymes: No results for input(s): CKTOTAL, CKMB, CKMBINDEX, TROPONINI in the last 168 hours.  HbA1C: Hgb A1c MFr Bld  Date/Time Value Ref Range Status  10/10/2020 06:22 AM 5.2 4.8 - 5.6 % Final    Comment:    (NOTE) Pre diabetes:          5.7%-6.4%  Diabetes:              >6.4%  Glycemic control for    <7.0% adults with diabetes   05/22/2020 06:20 PM 4.9 4.8 - 5.6 % Final    Comment:    (NOTE) Pre diabetes:          5.7%-6.4%  Diabetes:              >6.4%  Glycemic control for   <7.0% adults with diabetes     CBG: No results for input(s): GLUCAP in the last 168 hours.  Review of Systems:   Patient is encephalopathic and/or intubated. Therefore history has been obtained from chart review.    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   Surgical History:  History reviewed. No pertinent surgical history.   Social History:   reports that he has been smoking cigarettes. He has never used smokeless tobacco. He reports that he does not currently use alcohol. He reports that he does not currently use drugs after having used the following drugs: Cocaine, LSD, Marijuana, and IV.   Family History:  His family history is negative for Allergic rhinitis, Asthma, Eczema, and Urticaria.   Allergies Allergies  Allergen Reactions   Penicillins Other (See Comments), Anaphylaxis and Swelling    Reaction not recalled, but he IS allergic Did it involve swelling of the face/tongue/throat, SOB, or low BP? Unk Did it involve sudden or severe rash/hives, skin peeling, or any reaction on the inside of your mouth or nose? Unk Did you need to seek medical attention at a hospital or doctor's office? Unk When did it last happen? Childhood If all above answers are "NO", may proceed with cephalosporin use.  Reaction not recalled, but he IS allergic Did it involve swelling of the face/tongue/throat, SOB, or low BP? Unk Did it involve sudden or severe rash/hives, skin peeling, or any reaction on the inside of your mouth or nose? Unk Did you need to seek medical attention at a hospital or doctor's office? Unk When did  it last happen? Childhood If all above answers are "NO", may proceed with cephalosporin use.   Sulfa Antibiotics Anaphylaxis, Shortness Of Breath, Swelling and Rash    Throat swells   Prozac [Fluoxetine Hcl] Other (See Comments)    Makes mean   Bactrim [Sulfamethoxazole-Trimethoprim] Rash     Home Medications  Prior to Admission medications   Medication Sig Start Date End Date Taking? Authorizing Provider  ARIPiprazole ER (ABILIFY MAINTENA) 400 MG PRSY prefilled syringe Inject 400 mg into the muscle every 28 (twenty-eight) days.   Yes [provider]  FLUoxetine (PROZAC) 20 MG tablet Take 40 mg by mouth daily.   Yes [provider]  ARIPiprazole (ABILIFY) 10 MG tablet Take 1 tablet (10 mg total) by mouth daily. Patient not taking: No sig reported 10/14/20   Ethelene Hal, NP  escitalopram (LEXAPRO) 10 MG tablet Take 1 tablet (10 mg total) by mouth daily. Patient  not taking: No sig reported 10/14/20   Laveda Abbe, NP  gabapentin (NEURONTIN) 100 MG capsule Take 1 capsule (100 mg total) by mouth 3 (three) times daily. Patient not taking: Reported on 04/16/2021 10/14/20 11/13/20  Laveda Abbe, NP  hydrOXYzine (ATARAX/VISTARIL) 50 MG tablet Take 1 tablet (50 mg total) by mouth daily as needed for anxiety. Patient not taking: No sig reported 10/14/20   Laveda Abbe, NP     Critical care time: 38 minutes     Joneen Roach, AGACNP-BC [facility]  See Amion for personal pager PCCM on call pager [phone] until 7pm. Please call Elink 7p-7a. [phone]  04/16/2021 4:37 PM

## 2021-04-16 NOTE — ED Notes (Signed)
PER POISON CONTROL:  Monitor patient for nausea, vomiting, dizziness, seizures, and altered mental status.  Acetaminophen level at 1700

## 2021-04-17 LAB — CBC
HCT: 40.8 % (ref 39.0–52.0)
Hemoglobin: 13.9 g/dL (ref 13.0–17.0)
MCH: 30.7 pg (ref 26.0–34.0)
MCHC: 34.1 g/dL (ref 30.0–36.0)
MCV: 90.1 fL (ref 80.0–100.0)
Platelets: 242 10*3/uL (ref 150–400)
RBC: 4.53 MIL/uL (ref 4.22–5.81)
RDW: 12.8 % (ref 11.5–15.5)
WBC: 13.5 10*3/uL — ABNORMAL HIGH (ref 4.0–10.5)
nRBC: 0 % (ref 0.0–0.2)

## 2021-04-17 LAB — MAGNESIUM: Magnesium: 1.9 mg/dL (ref 1.7–2.4)

## 2021-04-17 LAB — BASIC METABOLIC PANEL
Anion gap: 8 (ref 5–15)
BUN: 6 mg/dL (ref 6–20)
CO2: 25 mmol/L (ref 22–32)
Calcium: 8.7 mg/dL — ABNORMAL LOW (ref 8.9–10.3)
Chloride: 105 mmol/L (ref 98–111)
Creatinine, Ser: 0.96 mg/dL (ref 0.61–1.24)
GFR, Estimated: >60 mL/min (ref >60)
Glucose, Bld: 95 mg/dL (ref 70–99)
Potassium: 3.8 mmol/L (ref 3.5–5.1)
Sodium: 138 mmol/L (ref 135–145)

## 2021-04-17 LAB — PHOSPHORUS: Phosphorus: 3 mg/dL (ref 2.5–4.6)

## 2021-04-17 MED ORDER — HYDROXYZINE HCL 25 MG PO TABS
50.0000 mg | ORAL_TABLET | Freq: Three times a day (TID) | ORAL | Status: DC | PRN
  Administered 2021-04-17 – 2021-04-18 (×2): 50 mg via ORAL
  Filled 2021-04-17 (×3): qty 2

## 2021-04-17 MED ORDER — LORAZEPAM 0.5 MG PO TABS
0.5000 mg | ORAL_TABLET | Freq: Two times a day (BID) | ORAL | Status: DC | PRN
  Administered 2021-04-17 – 2021-04-18 (×2): 0.5 mg via ORAL
  Filled 2021-04-17 (×2): qty 1

## 2021-04-17 MED ORDER — LACTATED RINGERS IV BOLUS
500.0000 mL | Freq: Once | INTRAVENOUS | Status: AC
  Administered 2021-04-17: 500 mL via INTRAVENOUS

## 2021-04-17 NOTE — TOC Progression Note (Signed)
Transition of Care [facility]) - Progression Note    Patient Details  Name: Billy Rose MRN: [id number] Date of Birth: [birth year]  Transition of Care [facility]) CM/SW Contact  Geni Bers, RN Phone Number: 04/17/2021, 3:37 PM  Clinical Narrative:    Faxing pt out to other Psychi facilities.   Expected Discharge Plan: Homeless Shelter Barriers to Discharge: No Barriers Identified  Expected Discharge Plan and Services Expected Discharge Plan: Homeless Shelter       Living arrangements for the past 2 months: Homeless                                       Social Determinants of Health (SDOH) Interventions    Readmission Risk Interventions No flowsheet data found.

## 2021-04-17 NOTE — TOC Progression Note (Signed)
Transition of Care [facility]) - Progression Note    Patient Details  Name: Billy Rose MRN: [id number] Date of Birth: Feb 03, 2001  Transition of Care [facility]) CM/SW Contact  Geni Bers, RN Phone Number: 04/17/2021, 12:51 PM  Clinical Narrative:    Pt will need [facility].   Expected Discharge Plan: Homeless Shelter Barriers to Discharge: No Barriers Identified  Expected Discharge Plan and Services Expected Discharge Plan: Homeless Shelter       Living arrangements for the past 2 months: Homeless                                       Social Determinants of Health (SDOH) Interventions    Readmission Risk Interventions No flowsheet data found.

## 2021-04-17 NOTE — TOC Progression Note (Signed)
Transition of Care [facility]) - Progression Note    Patient Details  Name: Billy Rose MRN: [id number] Date of Birth: March 19, 2001  Transition of Care [facility]) CM/SW Contact  Geni Bers, RN Phone Number: 04/17/2021, 4:38 PM  Clinical Narrative:    [name] offered pt a bed. He may transfer in the AM.    Expected Discharge Plan: Homeless Shelter Barriers to Discharge: No Barriers Identified  Expected Discharge Plan and Services Expected Discharge Plan: Homeless Shelter       Living arrangements for the past 2 months: Homeless                                       Social Determinants of Health (SDOH) Interventions    Readmission Risk Interventions No flowsheet data found.

## 2021-04-17 NOTE — Progress Notes (Signed)
NAMETaidyn Rose, MRN:  [id number], DOB:  2000/11/18, LOS: 1 ADMISSION DATE:  04/16/2021, CONSULTATION DATE:  11/15 REFERRING MD:  Dr. Zenia Resides EDP, CHIEF COMPLAINT:  SSRI overdose   History of Present Illness:  20 year old male with PMH as below, which is significant for major depressive disorder and substance induced psychosis. He is encephalopathic and post ictal; therefore history is obtained from chart review. He reported worsening depression over the preceding 9 months. On 11/15 he attempted suicide by taking #60 20mg  fluoxetine tablets as well as lacerating his anterior wrists.  Admitted to auditory hallucinations. He was being monitored in the emergency department when he suffered a brief seizure (one minute). Post ictally he was lethargic and PCCM was asked to admit to ICU.   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   Significant Hospital Events: Including procedures, antibiotic start and stop dates in addition to other pertinent events   11/15 admitted after intentional OD fluoxetine, seizure in ED, ICU for close monitoring 11/16 reports tried to electrocute himself with a toaster in the bath, then confirms taking large quantity of fluoxetine pills in effort to kil himself, denies active HI/SI currently   Interim History / Subjective:    Objective   Blood pressure 114/62, pulse 72, temperature 98.2 F (36.8 C), temperature source Oral, resp. rate 20, height 5\' 8"  (1.727 m), weight 46.5 kg, SpO2 98 %.        Intake/Output Summary (Last 24 hours) at 04/17/2021 0821 Last data filed at 04/17/2021 0757 Gross per 24 hour  Intake 1675.47 ml  Output 1700 ml  Net -24.53 ml   Filed Weights   04/16/21 2015 04/17/21 0435  Weight: 46.5 kg 46.5 kg    Examination: General: Young adult male in  NAD HENT: [place]/AT, PERRL, no JVD Lungs: Clear bilateral breath sounds, NWOB on RA Cardiovascular: RRR, no MRG Abdomen: Soft, non-tender, non-distended  Extremities: No acute deformity or ROM limitation. Shallow lacerations to L forearm scabbed over, fresh.  Neuro: Alert, oriented, no deficits   Resolved Hospital Problem list     Assessment & Plan:   Fluoxetine overdose: intentional overdose, suicide attempt. Patient took #60 20mg  tablets. Complicated by seizure in the ED, which is considered to be quite a rare reaction. - Load with Keppra to hopefully prevent further seizures while fluoxetine is metabolizing, no underlying seizure and given provoked no further anti-epileptics ordered - Qtc re-checked and remains WNL 11/16 - Encourage oral hydration - Vital signs per unit protocol including temperature checks - Psych following and recommending psych admission, medically cleared  Anxiety: chronic, understandably worse in current circumstances --Holding SSRI --Ativan 0.5 mg BID PRN  --Further recs per Psychiatry  Hypokalemia - IV K supplementation with improvement  Transfer out of ICU, await psychiatry recommendations  Best Practice (right click and "Reselect all SmartList Selections" daily)   Diet/type: Regular consistency (see orders) DVT prophylaxis: LMWH GI prophylaxis: PPI Lines: N/A Foley:  N/A Code Status:  full code Last date of multidisciplinary goals of care discussion [n/a]  Labs   CBC: Recent Labs  Lab 04/16/21 1403 04/17/21 0237  WBC 10.3 13.5*  NEUTROABS 7.4  --   HGB 16.2 13.9  HCT 46.6 40.8  MCV 89.3 90.1  PLT 305 242     Basic Metabolic Panel: Recent Labs  Lab 04/16/21 1545  04/17/21 0237  NA 139 138  K 3.4* 3.8  CL 103 105  CO2 25 25  GLUCOSE 86 95  BUN 5* 6  CREATININE 0.82 0.96  CALCIUM 8.9 8.7*  MG  --  1.9  PHOS  --  3.0   GFR: Estimated Creatinine Clearance: 80.1 mL/min (by C-G formula based on SCr of 0.96 mg/dL). Recent Labs   Lab 04/16/21 1403 04/17/21 0237  WBC 10.3 13.5*     Liver Function Tests: Recent Labs  Lab 04/16/21 1545  AST 22  ALT 22  ALKPHOS 74  BILITOT 0.6  PROT 6.9  ALBUMIN 4.1   No results for input(s): LIPASE, AMYLASE in the last 168 hours. No results for input(s): AMMONIA in the last 168 hours.  ABG No results found for: PHART, PCO2ART, PO2ART, HCO3, TCO2, ACIDBASEDEF, O2SAT   Coagulation Profile: No results for input(s): INR, PROTIME in the last 168 hours.  Cardiac Enzymes: No results for input(s): CKTOTAL, CKMB, CKMBINDEX, TROPONINI in the last 168 hours.  HbA1C: Hgb A1c MFr Bld  Date/Time Value Ref Range Status  10/10/2020 06:22 AM 5.2 4.8 - 5.6 % Final    Comment:    (NOTE) Pre diabetes:          5.7%-6.4%  Diabetes:              >6.4%  Glycemic control for   <7.0% adults with diabetes   05/22/2020 06:20 PM 4.9 4.8 - 5.6 % Final    Comment:    (NOTE) Pre diabetes:          5.7%-6.4%  Diabetes:              >6.4%  Glycemic control for   <7.0% adults with diabetes     CBG: No results for input(s): GLUCAP in the last 168 hours.  Review of Systems:   Patient is encephalopathic and/or intubated. Therefore history has been obtained from chart review.    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   Surgical History:  History reviewed. No pertinent surgical history.   Social History:   reports that he has been smoking cigarettes. He has never used smokeless tobacco. He reports that he does not currently use alcohol. He reports that he does not currently use drugs after having used the following drugs: Cocaine, LSD, Marijuana, and IV.   Family History:  His family history is negative for Allergic rhinitis, Asthma, Eczema, and Urticaria.   Allergies Allergies   Allergen Reactions   Penicillins Other (See Comments), Anaphylaxis and Swelling    Reaction not recalled, but he IS allergic Did it involve swelling of the face/tongue/throat, SOB, or low BP? Unk Did it involve sudden or severe rash/hives, skin peeling, or any reaction on the inside of your mouth or nose? Unk Did you need to seek medical attention at a hospital or doctor's office? Unk When did it last happen? Childhood If all above answers are "NO", may proceed with cephalosporin use.  Reaction not recalled, but he IS allergic Did it involve swelling of the face/tongue/throat, SOB, or low BP? Unk Did it involve sudden or severe rash/hives, skin peeling, or any reaction on the inside of your mouth or nose? Unk Did you need to seek medical attention at a hospital or doctor's office? Unk When did it last happen? Childhood If all  above answers are "NO", may proceed with cephalosporin use.   Sulfa Antibiotics Anaphylaxis, Shortness Of Breath, Swelling and Rash    Throat swells   Prozac [Fluoxetine Hcl] Other (See Comments)    Makes mean   Bactrim [Sulfamethoxazole-Trimethoprim] Rash     Home Medications  Prior to Admission medications   Medication Sig Start Date End Date Taking? Authorizing Provider  ARIPiprazole ER (ABILIFY MAINTENA) 400 MG PRSY prefilled syringe Inject 400 mg into the muscle every 28 (twenty-eight) days.   Yes [provider]  FLUoxetine (PROZAC) 20 MG tablet Take 40 mg by mouth daily.   Yes [provider]  ARIPiprazole (ABILIFY) 10 MG tablet Take 1 tablet (10 mg total) by mouth daily. Patient not taking: No sig reported 10/14/20   Laveda Abbe, NP  escitalopram (LEXAPRO) 10 MG tablet Take 1 tablet (10 mg total) by mouth daily. Patient not taking: No sig reported 10/14/20   Laveda Abbe, NP  gabapentin (NEURONTIN) 100 MG capsule Take 1 capsule (100 mg total) by mouth 3 (three) times daily. Patient not taking: Reported on 04/16/2021  10/14/20 11/13/20  Laveda Abbe, NP  hydrOXYzine (ATARAX/VISTARIL) 50 MG tablet Take 1 tablet (50 mg total) by mouth daily as needed for anxiety. Patient not taking: No sig reported 10/14/20   Laveda Abbe, NP     Critical care time: n/a     Karren Burly, MD [place] [facility]  See Amion for personal phone PCCM on call pager [phone] until 7pm. Please call Elink 7p-7a. [phone]  04/17/2021 8:21 AM

## 2021-04-17 NOTE — Progress Notes (Signed)
Pt stated feeling anxious still. Dr. Judeth Horn was notified. Nurse tech sitting in pt's room said patient was seeing things in the room. Pt aiming at something in the room, throwing finger guns. Pt smiling/laughing at something in the room. Night RN notified me this morning pt was having visual & possible auditory hallucinations. Dr. Judeth Horn also notified of this.

## 2021-04-17 NOTE — Progress Notes (Signed)
Pt states he gives permission for his mother to receive all information regarding his hospital stay.

## 2021-04-17 NOTE — TOC Progression Note (Signed)
Transition of Care [facility]) - Progression Note    Patient Details  Name: Billy Rose MRN: [id number] Date of Birth: 02-Nov-2000  Transition [facility]) CM/SW Contact  Geni Bers, RN Phone Number: 04/17/2021, 12:20 PM  Clinical Narrative:    TOC will continue to follow pt for discharge needs. Pt is Homeless.   Expected Discharge Plan: Homeless Shelter Barriers to Discharge: No Barriers Identified  Expected Discharge Plan and Services Expected Discharge Plan: Homeless Shelter       Living arrangements for the past 2 months: Homeless                                       Social Determinants of Health (SDOH) Interventions    Readmission Risk Interventions No flowsheet data found.

## 2021-04-18 DIAGNOSIS — T43222A Poisoning by selective serotonin reuptake inhibitors, intentional self-harm, initial encounter: Secondary | ICD-10-CM | POA: Diagnosis not present

## 2021-04-18 NOTE — TOC Transition Note (Addendum)
Transition of Care [facility]) - CM/SW Discharge Note   Patient Details  Name: Billy Rose MRN: [id number] Date of Birth: 20-Sep-2000  Transition [facility] Care [facility]) CM/SW Contact:  Amada Jupiter, LCSW Phone Number: 04/18/2021, 10:56 AM   Clinical Narrative:    Pt medically cleared for dc today to [facility] ([address]  [place], [place]).  Have confirmed with admissions contact, Nash Dimmer @ [phone], that bed is ready today anytime.  TOC confirmed with pt and mother that this is a voluntary admission.  Mother will transport pt to facility.  MD and RN aware. No further TOC needs.   ADDENDUM:  Pt to be transported [employer].  Final next level of care: Psychiatric Hospital Barriers to Discharge: Barriers Resolved   Patient Goals and CMS Choice Patient states their goals for this hospitalization and ongoing recovery are:: AMS      Discharge Placement                Patient to be transferred to facility by: mother Name of family member notified: mother    Discharge Plan and Services                DME Arranged: N/A DME Agency: NA                  Social Determinants of Health (SDOH) Interventions     Readmission Risk Interventions No flowsheet data found.

## 2021-04-18 NOTE — TOC Progression Note (Signed)
Transition of Care [facility]) - Progression Note    Patient Details  Name: Hrithik Boschee MRN: [id number] Date of Birth: 05/18/2001  Transition of Care [facility]) CM/SW Contact  Geni Bers, RN Phone Number: 04/18/2021, 9:02 AM  Clinical Narrative:    Pt agreed to going to [facility] yesterday in the present of his mother at bedside. This CM also explained IVC to the pt. Pt is willing to go on his on. Voluntarily.    Expected Discharge Plan: Homeless Shelter Barriers to Discharge: No Barriers Identified  Expected Discharge Plan and Services Expected Discharge Plan: Homeless Shelter       Living arrangements for the past 2 months: Homeless Expected Discharge Date: 04/18/21                                     Social Determinants of Health (SDOH) Interventions    Readmission Risk Interventions No flowsheet data found.

## 2021-04-18 NOTE — Progress Notes (Signed)
This RN provided education to patient. All questions answered. PIVs removed. Belongings returned to [employer] (mother). Pt safely ambulated down with RN to main entrance. Patient got into safe transport vehicle and is being transferred to [facility].

## 2021-04-18 NOTE — Discharge Summary (Addendum)
Physician Discharge Summary   Patient ID: Billy Rose [id number] 20 y.o. July 20, 2000  Admit date: 04/16/2021  Discharge date: 04/18/2021   Admitting Physician: Karren Burly, MD   Discharge Physician: Karren Burly, MD  Admission Diagnoses: Intentional drug overdose, initial encounter [facility]) [T50.902A] Intentional overdose of selective serotonin reuptake inhibitor (SSRI) [facility]) [T43.222A]  Discharge Diagnoses: as above  Admission Condition: critical  Discharged Condition: fair  Indication for Admission: suicide attempt, ICU monitoring following ICU overdose, seizure  Hospital Course:   Patient admitted 04/16/2021 after intentional overdose at home with 60 pills of 20 mg fluoxetine.  Obvious signs of self-harm, cutting, left arm.  Also endorsed trying to electrocute himself in the bathtub with toaster.  Witnessed seizure in the ED, 2 mg of Ativan.  Postictal but protecting airway.  Keppra 1 g IV x1 given.  Mental status improved.  QTC okay.  Monitor in the ICU for ongoing cardiac monitoring.  No further seizures.  Felt related to SSRI overdose, no further antiepileptics given.  Remained stable.  Medically cleared.  Psychiatry recommended inpatient psychiatric admission.  Low-dose infrequent p.o. as needed Ativan and as needed hydroxyzine used for anxiety symptoms.  Transfer to [facility] morning of 04/18/2021.  Prior to admission meds include fluoxetine 20 mg daily and monthly Abilify injections.  Consults:  Psychiatry  Significant Diagnostic Studies: As per EMR  Treatments: IV hydration and anxiolytics  Discharge Exam: BP 127/80   Pulse (!) 58   Temp 98.1 F (36.7 C) (Oral)   Resp 14   Ht 5\' 8"  (1.727 m)   Wt 63 kg   SpO2 98%   BMI 21.12 kg/m   General Appearance:    Alert, cooperative, no distress, appears stated age  Head:    Normocephalic, without obvious abnormality, atraumatic  Eyes:    PERRL, conjunctiva/corneas clear, EOM's intact, fundi    benign,  both eyes       Ears:    Normal external ear canals, both ears  Nose:   Nares normal, septum midline, no drainage    or sinus tenderness  Throat:   Lips, mucosa, and tongue normal; teeth and gums normal  Neck:   Supple, symmetrical, trachea midline, no adenopathy;       thyroid:  No enlargement/tenderness/nodules; no carotid   bruit or JVD  Back:     Symmetric, no curvature, ROM normal, no CVA tenderness  Lungs:     Clear to auscultation bilaterally, respirations unlabored  Chest wall:    No tenderness or deformity  Heart:    Regular rate and rhythm, S1 and S2 normal, no murmur, rub   or gallop  Abdomen:     Soft, non-tender, bowel sounds active all four quadrants,    no masses, no organomegaly        Extremities: Left upper extremity with shallow lacerations that appear self-inflicted, no cyanosis or edema  Pulses:   2+ and symmetric all extremities  Skin:   Skin color, texture, turgor normal, no rashes or lesions     Neurologic:   CNII-XII intact. Normal strength    Disposition: Discharge disposition: 65-Discharged/transferred to Psychiatric Hospital or Psychiatric Unit/Distinct Part of Hospital       Patient Instructions:  Allergies as of 04/18/2021       Reactions   Penicillins Other (See Comments), Anaphylaxis, Swelling   Reaction not recalled, but he IS allergic Did it involve swelling of the face/tongue/throat, SOB, or low BP? Unk Did it involve sudden or severe rash/hives,  skin peeling, or any reaction on the inside of your mouth or nose? Unk Did you need to seek medical attention at a hospital or doctor's office? Unk When did it last happen? Childhood If all above answers are "NO", may proceed with cephalosporin use. Reaction not recalled, but he IS allergic Did it involve swelling of the face/tongue/throat, SOB, or low BP? Unk Did it involve sudden or severe rash/hives, skin peeling, or any reaction on the inside of your mouth or nose? Unk Did you need to seek  medical attention at a hospital or doctor's office? Unk When did it last happen? Childhood If all above answers are "NO", may proceed with cephalosporin use.   Sulfa Antibiotics Anaphylaxis, Shortness Of Breath, Swelling, Rash   Throat swells   Prozac [fluoxetine Hcl] Other (See Comments)   Makes mean   Bactrim [sulfamethoxazole-trimethoprim] Rash        Medication List     ASK your doctor about these medications    ARIPiprazole ER 400 MG Prsy prefilled syringe Commonly known as: ABILIFY MAINTENA Inject 400 mg into the muscle every 28 (twenty-eight) days. Ask about: Which instructions should I use?   FLUoxetine 20 MG tablet Commonly known as: PROZAC Take 40 mg by mouth daily.   gabapentin 100 MG capsule Commonly known as: NEURONTIN Take 1 capsule (100 mg total) by mouth 3 (three) times daily.       Activity: activity as tolerated Diet: regular diet Wound Care: none needed    SignedLesia Sago [facility] 04/18/2021 7:47 AM

## 2021-10-15 IMAGING — CT CT CERVICAL SPINE W/O CM
3 of 4 series · 13 of 35 positions shown, 16 images · non-contrast
Comparison: None.

CLINICAL DATA: Neck pain, acute, no red flags found down, acute
neck pain. AMS. Overdose. Patient found down and unresponsive.

EXAM:
CT CERVICAL SPINE WITHOUT CONTRAST
TECHNIQUE: Multidetector CT imaging of the cervical spine was performed without
intravenous contrast. Multiplanar CT image reconstructions were also
generated.

[Series 4: sagittal bone · sagittal · 0.31mm/px · 5 of 69 slices shown, 6 images]
[im 23/69  bone]
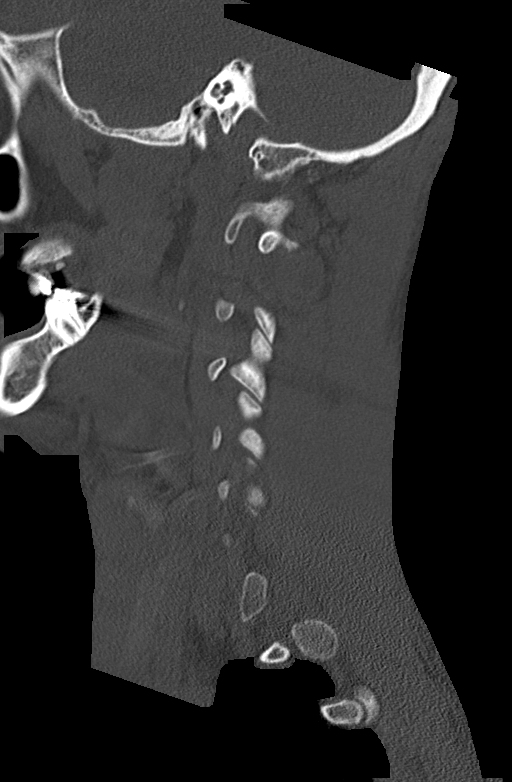
[im 29/69  bone]
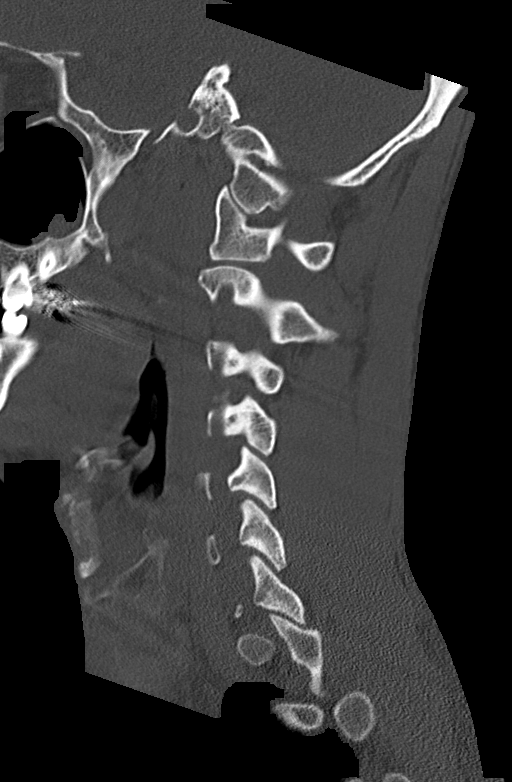
[im 35/69  soft-tissue]
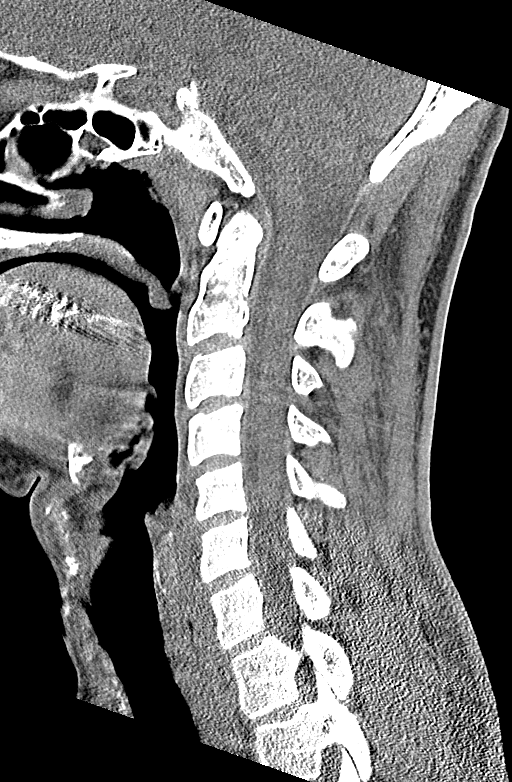
[im 35/69  bone]
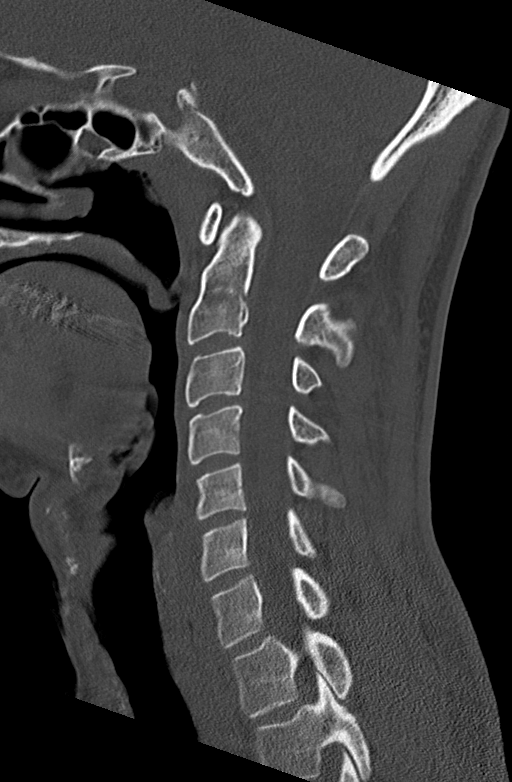
[im 40/69  bone]
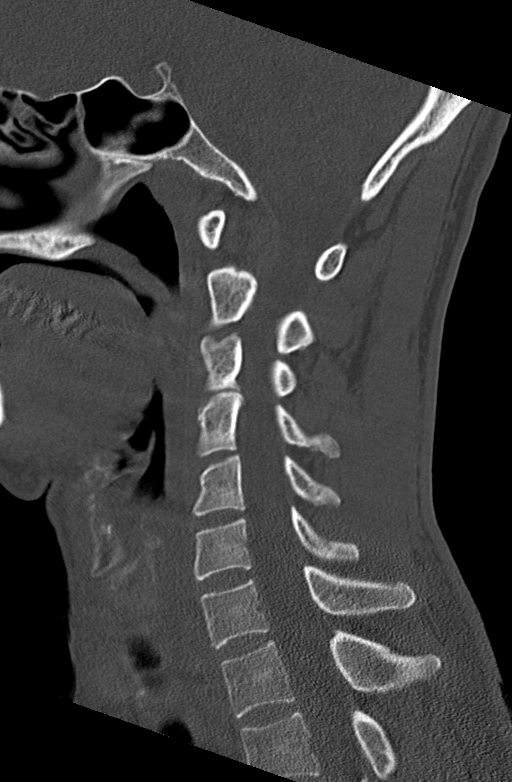
[im 46/69  bone]
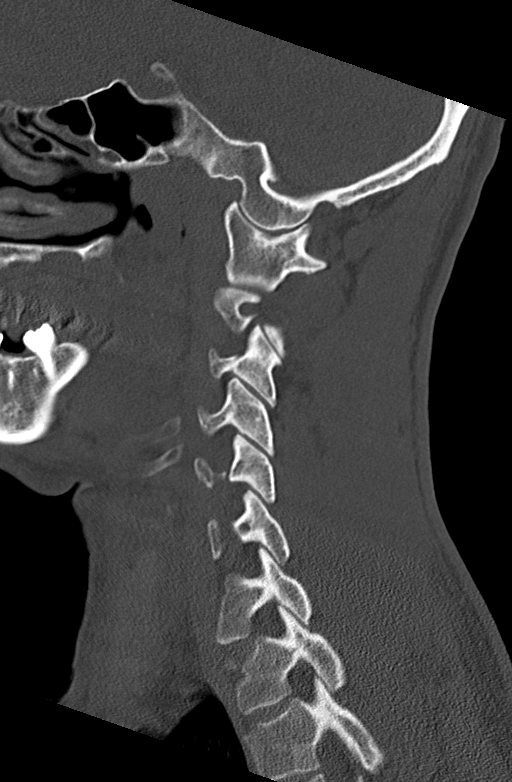

[Series 7: orthogonal bone · axial · 0.31mm/px · z∈[-192,-43]mm · 5 of 119 slices shown, 7 images]
[im 20/119  soft-tissue]
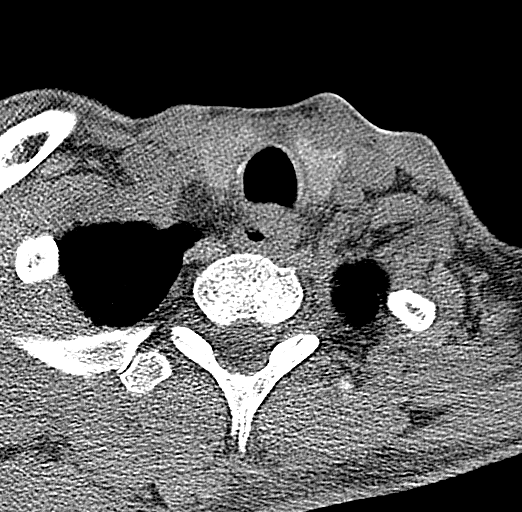
[im 20/119  bone]
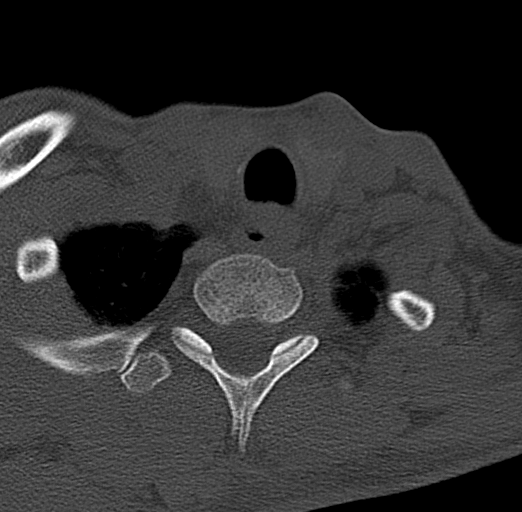
[im 40/119  bone]
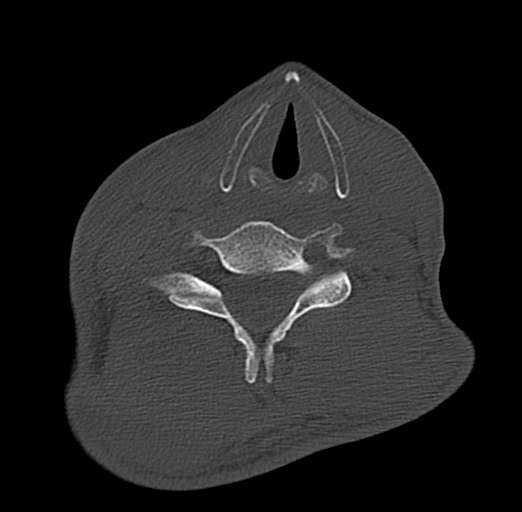
[im 60/119  bone]
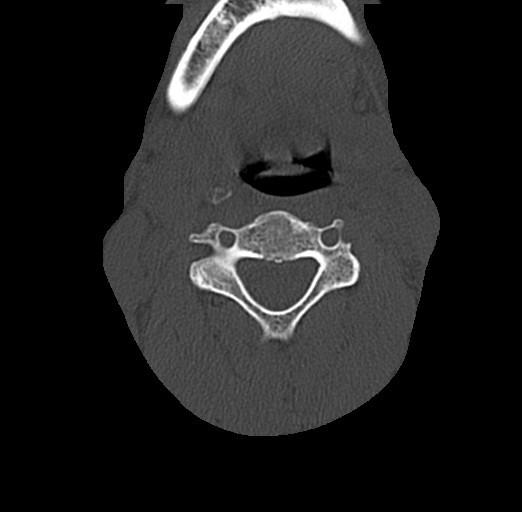
[im 79/119  bone]
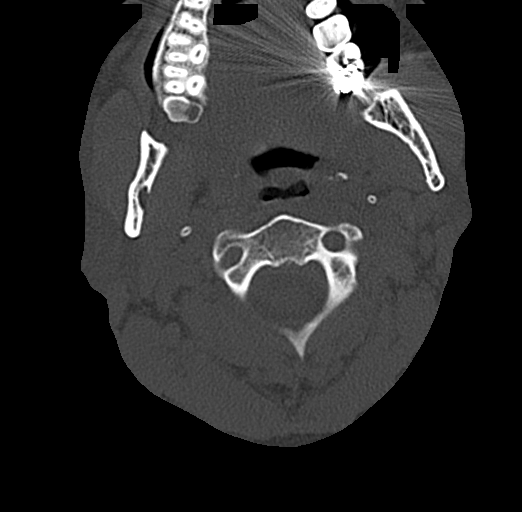
[im 99/119  soft-tissue]
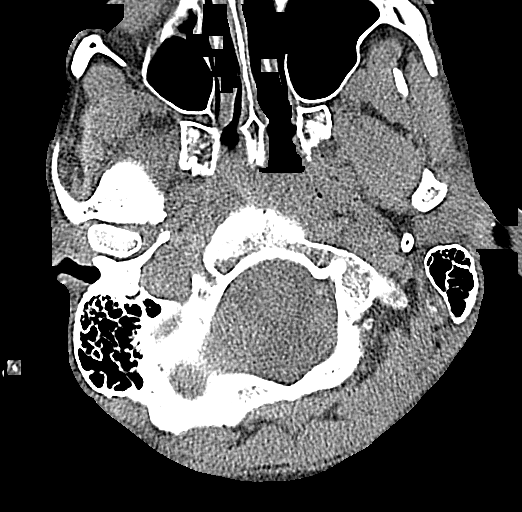
[im 99/119  bone]
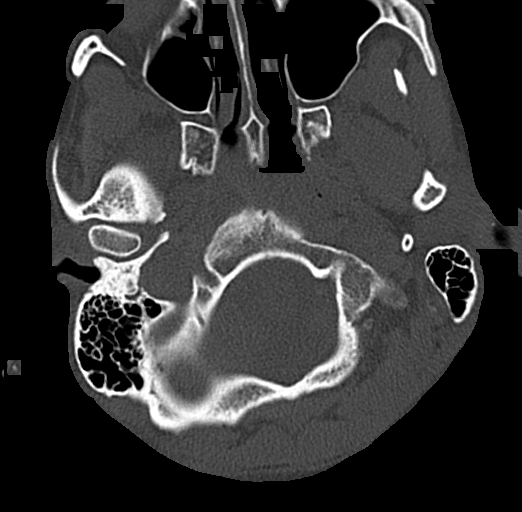

[Series 8: coronal bone · coronal · 0.37mm/px · 3 of 77 slices shown]
[im 24/77  bone]
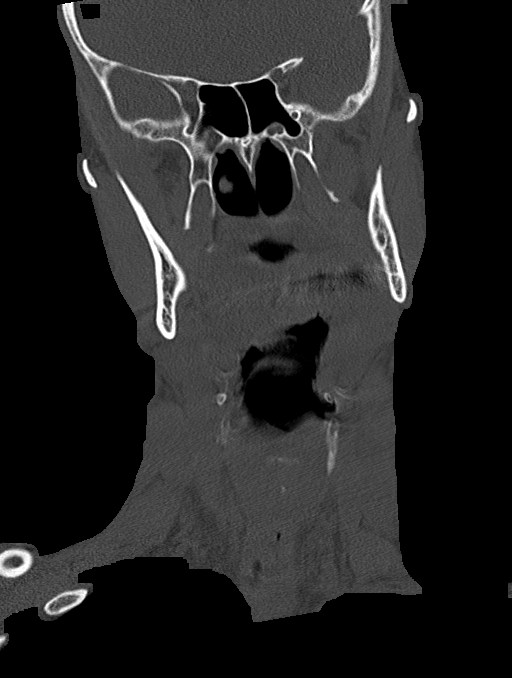
[im 34/77  bone]
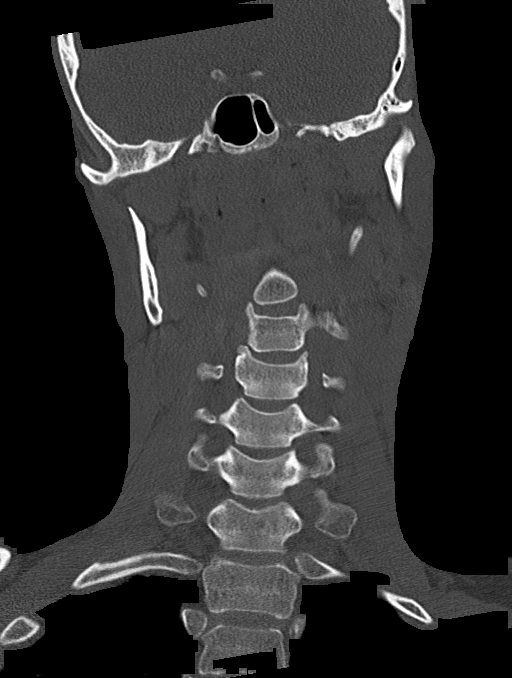
[im 44/77  bone]
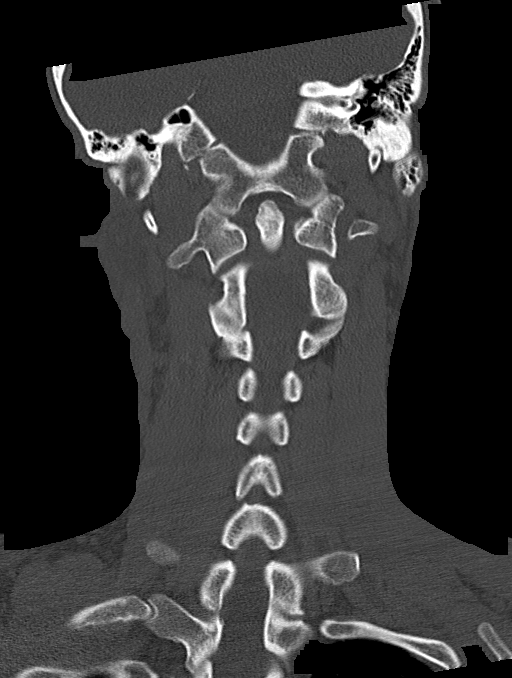

[13 of 35 positions shown; findings below may reference images not displayed]

FINDINGS: Alignment: Normal.

Skull base and vertebrae: No acute fracture. No aggressive appearing
focal osseous lesion or focal pathologic process.

Soft tissues and spinal canal: No prevertebral fluid or swelling. No
visible canal hematoma.

Upper chest: Unremarkable.

Other: Bilateral maxillary mucosal thickening.
IMPRESSION: 1. No acute displaced fracture or traumatic listhesis of the
cervical spine.
2. Please see separately dictated CT head 03/12/2021.

## 2021-10-15 IMAGING — CT CT HEAD W/O CM
3 series · 14 of 47 positions shown, 16 images · non-contrast
Comparison: None.

CLINICAL DATA: Mental status change, unknown cause

EXAM:
CT HEAD WITHOUT CONTRAST
TECHNIQUE: Contiguous axial images were obtained from the base of the skull
through the vertex without intravenous contrast.

[Series 2: head wo · axial · 0.42mm/px · z∈[-159,-34]mm · 8 of 31 slices shown, 10 images]
[im 3/31  brain]
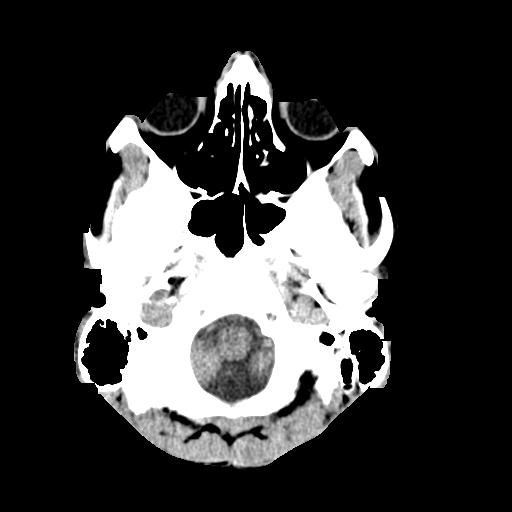
[im 3/31  bone]
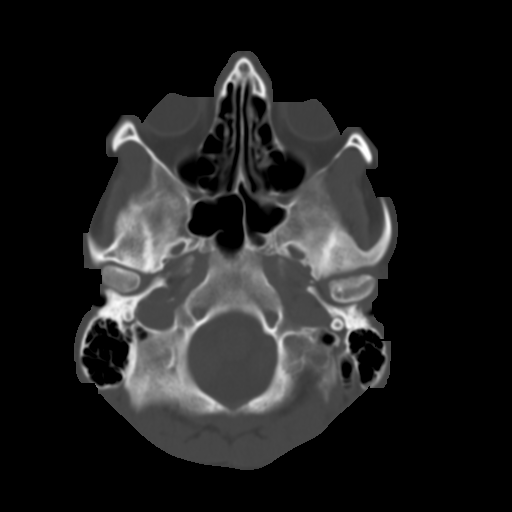
[im 7/31  brain]
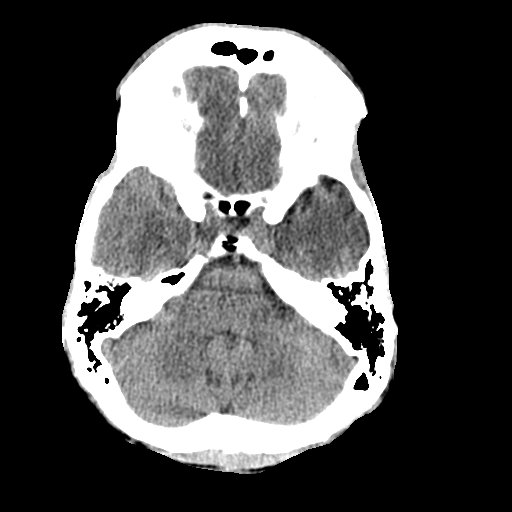
[im 10/31  brain]
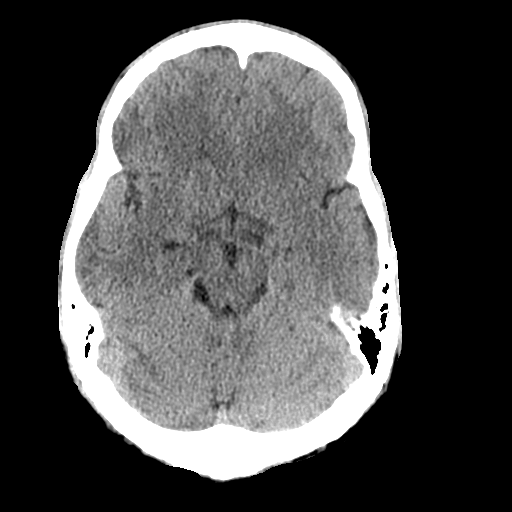
[im 14/31  brain]
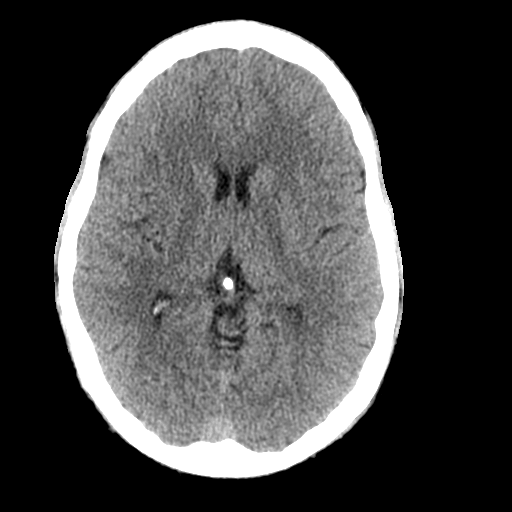
[im 17/31  brain]
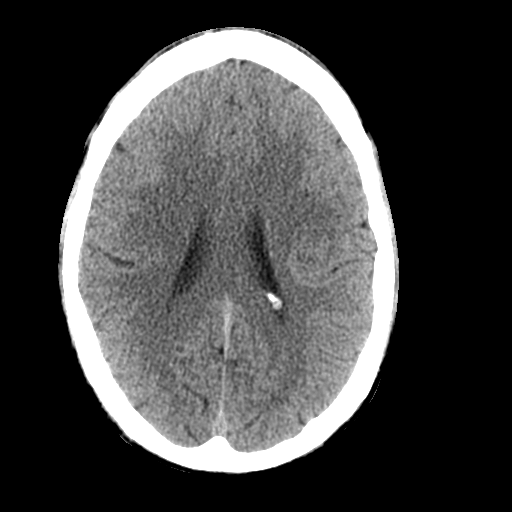
[im 17/31  bone]
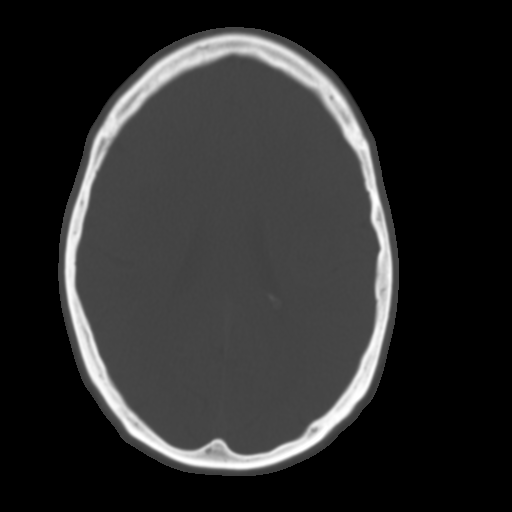
[im 21/31  brain]
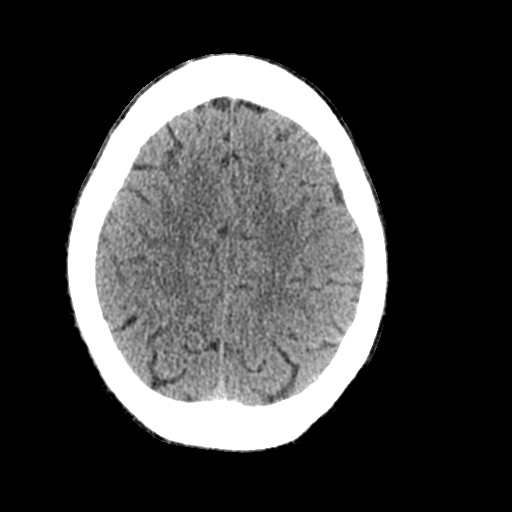
[im 24/31  brain]
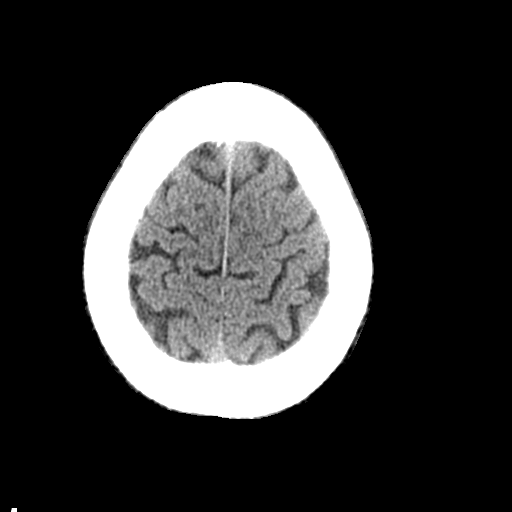
[im 28/31  brain]
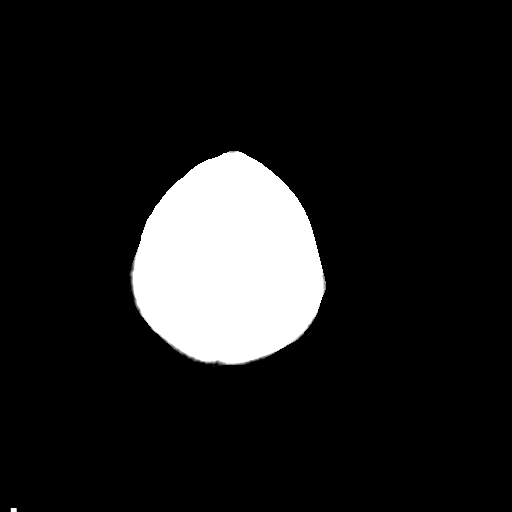

[Series 5: coronal soft tissue · coronal · 0.30mm/px · 3 of 76 slices shown]
[im 26/76  brain]
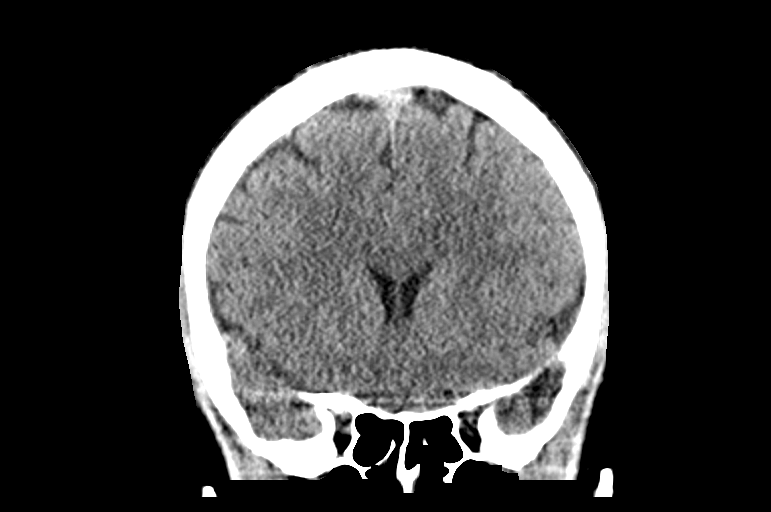
[im 34/76  brain]
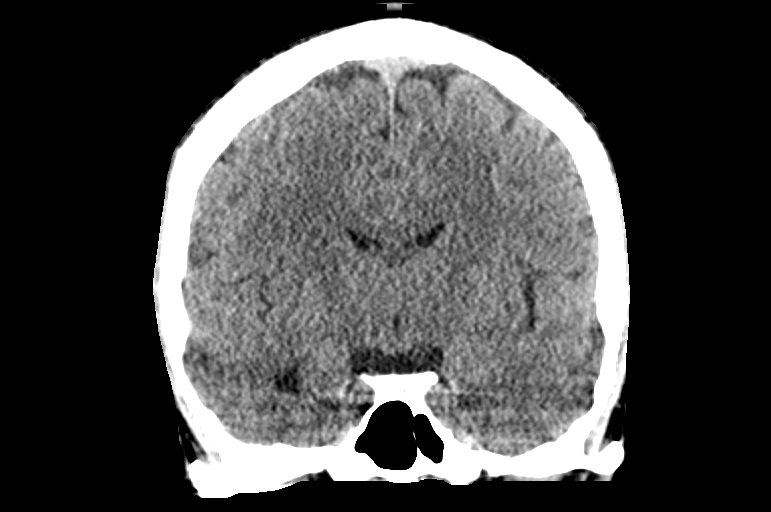
[im 42/76  brain]
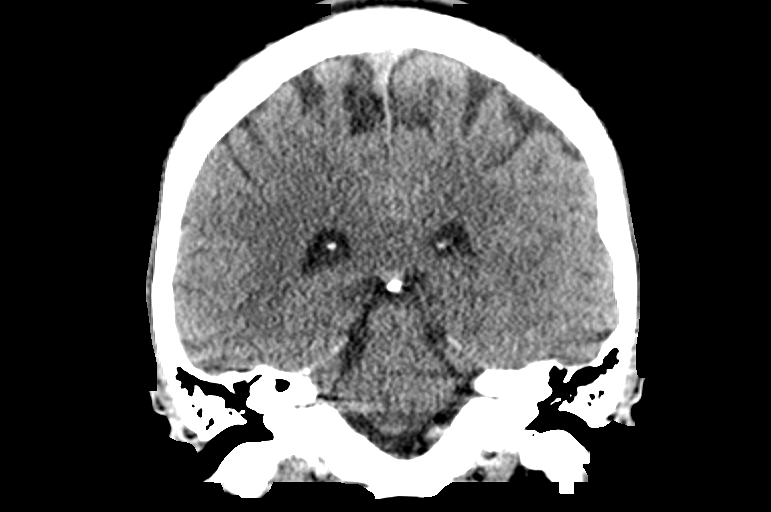

[Series 6: sagittal soft tissue · sagittal · 0.31mm/px · 3 of 66 slices shown]
[im 22/66  brain]
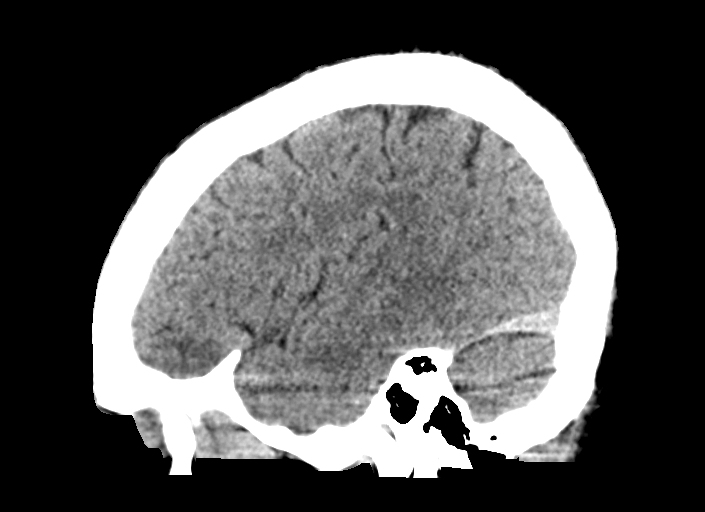
[im 33/66  brain]
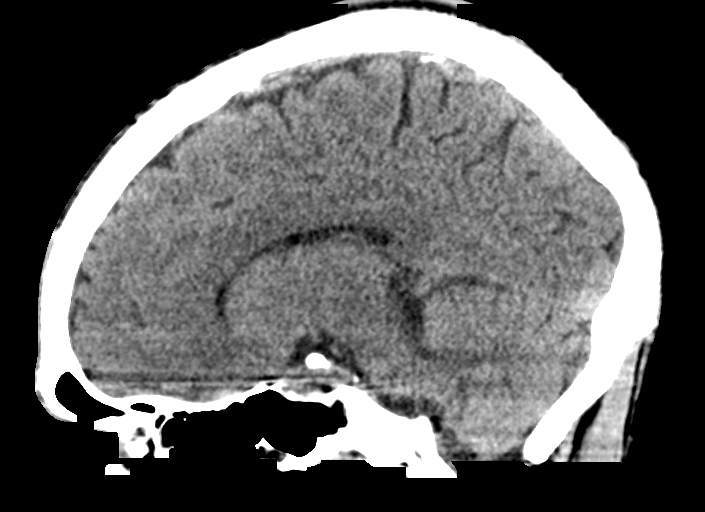
[im 44/66  brain]
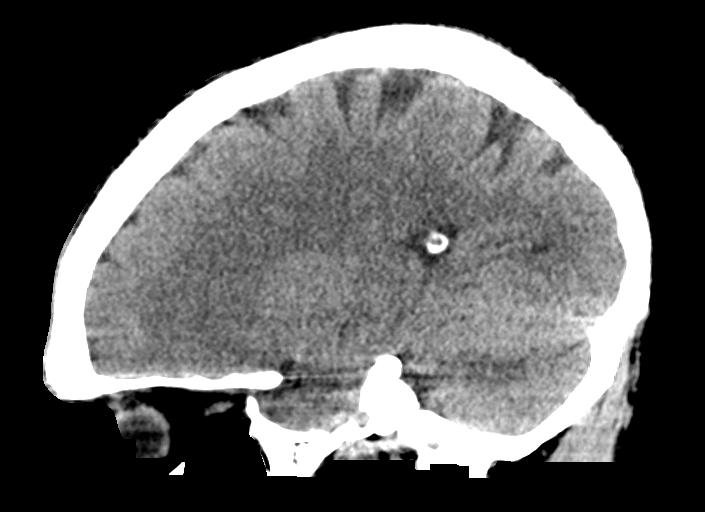

[14 of 47 positions shown; findings below may reference images not displayed]

FINDINGS: Brain: No evidence of acute intracranial hemorrhage or extra-axial
collection.No evidence of mass lesion/concern mass effect.The
ventricles are normal in size.

Vascular: No hyperdense vessel or unexpected calcification.

Skull: Normal. Negative for fracture or focal lesion.

Sinuses/Orbits: No acute finding.

Other: None.
IMPRESSION: No acute intracranial abnormality.

## 2022-02-05 ENCOUNTER — Encounter (HOSPITAL_COMMUNITY): Payer: Self-pay

## 2022-02-05 ENCOUNTER — Other Ambulatory Visit: Payer: Self-pay

## 2022-02-05 ENCOUNTER — Emergency Department (HOSPITAL_COMMUNITY)
Admission: EM | Admit: 2022-02-05 | Discharge: 2022-02-07 | Disposition: A | Discharge status: Other Acute Inpatient Hospital | Payer: Medicaid Other | Attending: Emergency Medicine | Admitting: Emergency Medicine

## 2022-02-05 DIAGNOSIS — Z20822 Contact with and (suspected) exposure to covid-19: Secondary | ICD-10-CM | POA: Insufficient documentation

## 2022-02-05 DIAGNOSIS — T50902A Poisoning by unspecified drugs, medicaments and biological substances, intentional self-harm, initial encounter: Secondary | ICD-10-CM

## 2022-02-05 DIAGNOSIS — T43592A Poisoning by other antipsychotics and neuroleptics, intentional self-harm, initial encounter: Secondary | ICD-10-CM | POA: Diagnosis not present

## 2022-02-05 DIAGNOSIS — F32A Depression, unspecified: Secondary | ICD-10-CM

## 2022-02-05 DIAGNOSIS — F319 Bipolar disorder, unspecified: Secondary | ICD-10-CM | POA: Diagnosis not present

## 2022-02-05 DIAGNOSIS — Z046 Encounter for general psychiatric examination, requested by authority: Secondary | ICD-10-CM

## 2022-02-05 DIAGNOSIS — F315 Bipolar disorder, current episode depressed, severe, with psychotic features: Secondary | ICD-10-CM | POA: Diagnosis not present

## 2022-02-05 DIAGNOSIS — T43292A Poisoning by other antidepressants, intentional self-harm, initial encounter: Secondary | ICD-10-CM | POA: Diagnosis not present

## 2022-02-05 DIAGNOSIS — Y900 Blood alcohol level of less than 20 mg/100 ml: Secondary | ICD-10-CM | POA: Insufficient documentation

## 2022-02-05 LAB — COMPREHENSIVE METABOLIC PANEL
ALT: 17 U/L (ref 0–44)
AST: 17 U/L (ref 15–41)
Albumin: 3.9 g/dL (ref 3.5–5.0)
Alkaline Phosphatase: 52 U/L (ref 38–126)
Anion gap: 7 (ref 5–15)
BUN: 10 mg/dL (ref 6–20)
CO2: 23 mmol/L (ref 22–32)
Calcium: 8.9 mg/dL (ref 8.9–10.3)
Chloride: 110 mmol/L (ref 98–111)
Creatinine, Ser: 0.77 mg/dL (ref 0.61–1.24)
GFR, Estimated: >60 mL/min (ref >60)
Glucose, Bld: 98 mg/dL (ref 70–99)
Potassium: 3.6 mmol/L (ref 3.5–5.1)
Sodium: 140 mmol/L (ref 135–145)
Total Bilirubin: 0.8 mg/dL (ref 0.3–1.2)
Total Protein: 6.1 g/dL — ABNORMAL LOW (ref 6.5–8.1)

## 2022-02-05 LAB — RAPID URINE DRUG SCREEN, HOSP PERFORMED
Amphetamines: NOT DETECTED
Barbiturates: NOT DETECTED
Benzodiazepines: NOT DETECTED
Cocaine: NOT DETECTED
Opiates: NOT DETECTED
Tetrahydrocannabinol: NOT DETECTED

## 2022-02-05 LAB — CBC WITH DIFFERENTIAL/PLATELET
Abs Immature Granulocytes: 0.01 10*3/uL (ref 0.00–0.07)
Basophils Absolute: 0 10*3/uL (ref 0.0–0.1)
Basophils Relative: 0 %
Eosinophils Absolute: 0.2 10*3/uL (ref 0.0–0.5)
Eosinophils Relative: 3 %
HCT: 38.3 % — ABNORMAL LOW (ref 39.0–52.0)
Hemoglobin: 13.1 g/dL (ref 13.0–17.0)
Immature Granulocytes: 0 %
Lymphocytes Relative: 18 %
Lymphs Abs: 1.3 10*3/uL (ref 0.7–4.0)
MCH: 30.8 pg (ref 26.0–34.0)
MCHC: 34.2 g/dL (ref 30.0–36.0)
MCV: 90.1 fL (ref 80.0–100.0)
Monocytes Absolute: 0.4 10*3/uL (ref 0.1–1.0)
Monocytes Relative: 6 %
Neutro Abs: 5.1 10*3/uL (ref 1.7–7.7)
Neutrophils Relative %: 73 %
Platelets: 191 10*3/uL (ref 150–400)
RBC: 4.25 MIL/uL (ref 4.22–5.81)
RDW: 12.2 % (ref 11.5–15.5)
WBC: 7 10*3/uL (ref 4.0–10.5)
nRBC: 0 % (ref 0.0–0.2)

## 2022-02-05 LAB — ETHANOL: Alcohol, Ethyl (B): <10 mg/dL (ref <10)

## 2022-02-05 LAB — SALICYLATE LEVEL: Salicylate Lvl: <7 mg/dL — ABNORMAL LOW (ref 7.0–30.0)

## 2022-02-05 LAB — RESP PANEL BY RT-PCR (FLU A&B, COVID) ARPGX2
Influenza A by PCR: NEGATIVE
Influenza B by PCR: NEGATIVE
SARS Coronavirus 2 by RT PCR: NEGATIVE

## 2022-02-05 LAB — ACETAMINOPHEN LEVEL
Acetaminophen (Tylenol), Serum: <10 ug/mL — ABNORMAL LOW (ref 10–30)
Acetaminophen (Tylenol), Serum: <10 ug/mL — ABNORMAL LOW (ref 10–30)

## 2022-02-05 LAB — MAGNESIUM: Magnesium: 1.6 mg/dL — ABNORMAL LOW (ref 1.7–2.4)

## 2022-02-05 MED ORDER — MAGNESIUM SULFATE 2 GM/50ML IV SOLN
2.0000 g | Freq: Once | INTRAVENOUS | Status: AC
  Administered 2022-02-05: 2 g via INTRAVENOUS
  Filled 2022-02-05: qty 50

## 2022-02-05 MED ORDER — POTASSIUM CHLORIDE 10 MEQ/100ML IV SOLN
10.0000 meq | INTRAVENOUS | Status: AC
  Administered 2022-02-05 (×2): 10 meq via INTRAVENOUS
  Filled 2022-02-05 (×2): qty 100

## 2022-02-05 MED ORDER — LACTATED RINGERS IV BOLUS
1000.0000 mL | Freq: Once | INTRAVENOUS | Status: AC
  Administered 2022-02-05: 1000 mL via INTRAVENOUS

## 2022-02-05 NOTE — ED Notes (Signed)
Pt belonging off and remove from room. Place at nurse desk. Black top black bottom black shoes

## 2022-02-05 NOTE — ED Notes (Signed)
Pt aware of urine sample. Urinal in hand 

## 2022-02-05 NOTE — ED Provider Notes (Signed)
[facility]-EMERGENCY DEPT Provider Note   CSN: [id number] Arrival date & time: 02/05/22  1652    History  Chief Complaint  Patient presents with   Drug Overdose    Billy Rose is a 21 y.o. male history of schizophrenia, bipolar, prior intentional overdose, suicide attempt, depression, substance use psychotic disorder here for evaluation of external overdose.  States he got to an argument with his mother.  States he went to his room and took some pills.  He initially told me he took #1 exact tablet and "a few" Klonopin tablets.  Told EMS he took Prozac and Risperdal.  Per EMS bottles from Prozac and Risperdal refilled in early July and had 2 to 3 pills left in each of them.  Admits to this being an attempt at self-harm.  Had prior history of intentional overdose requiring ICU admission for recurrent seizures after taking SSRIs.  He denies any alcohol or other illicit substance use.  He denies any current complaints at this time.  States he has been depressed for quite some time. No fever, emsis, CP, SOB, abd pain, numbness or weakness.  Per EMS patient had abnormal body movements with his mouth and his body however on my initial evaluation patient cooperative, no abnormal movements. He denies HI, AVH.  Gave permission to speak with mother>> Per Silas [place], patient's mother.  Patient has history of depression and has had worsening thoughts at home.  States he has been inpatient multiple times, typically will be inpatient for 1 to 2 days and then states he is fine to be discharged home.  He does not follow-up outpatient with psychiatry he is not compliant with medications at home.  Mother states patient is a "drug shopper" and will get anything  Mother's outpatient take #1 Prozac, #1 Risperdal  Medications were last filled at the end of June.  Did find Risperdal scattered in various places in his apartment, under the couch, in the kitchen, he does not appear to be compliant  with his medication she is unsure how many pills were in the bottles to begin with.  States there were 3 pills left in each Risperdal and Prozac container which were not taken.   Poison control Obs 8 hours, biggest risk 4-6 hours after ingestion    HPI     Home Medications Prior to Admission medications   Medication Sig Start Date End Date Taking? Authorizing Provider  FLUoxetine (PROZAC) 10 MG capsule Take 10 mg by mouth daily.   Yes [provider]  risperiDONE (RISPERDAL) 3 MG tablet Take 3 mg by mouth at bedtime.   Yes [provider]      Allergies    Penicillins, Sulfa antibiotics, and Abilify [aripiprazole]    Review of Systems   Review of Systems  Constitutional: Negative.   HENT: Negative.    Respiratory: Negative.    Cardiovascular: Negative.   Gastrointestinal: Negative.   Genitourinary: Negative.   Musculoskeletal: Negative.   Skin: Negative.   Neurological: Negative.   Psychiatric/Behavioral:  Positive for self-injury, sleep disturbance and suicidal ideas. The patient is nervous/anxious.   All other systems reviewed and are negative.   Physical Exam Updated Vital Signs BP 110/67   Pulse 85   Temp 98 F (36.7 C) (Oral)   Resp (!) 26   Ht 5\' 11"  (1.803 m)   Wt 59 kg   SpO2 96%   BMI 18.13 kg/m  Physical Exam Vitals and nursing note reviewed.  Constitutional:  General: He is not in acute distress.    Appearance: He is well-developed. He is not ill-appearing, toxic-appearing or diaphoretic.  HENT:     Head: Normocephalic and atraumatic.     Nose: Nose normal.     Mouth/Throat:     Mouth: Mucous membranes are moist.  Eyes:     Pupils: Pupils are equal, round, and reactive to light.  Cardiovascular:     Rate and Rhythm: Normal rate and regular rhythm.     Pulses: Normal pulses.     Heart sounds: Normal heart sounds.  Pulmonary:     Effort: Pulmonary effort is normal. No respiratory distress.     Breath sounds: Normal  breath sounds.  Abdominal:     General: Bowel sounds are normal. There is no distension.     Palpations: Abdomen is soft.  Musculoskeletal:        General: No swelling, tenderness, deformity or signs of injury. Normal range of motion.     Cervical back: Normal range of motion and neck supple.  Skin:    General: Skin is warm and dry.  Neurological:     General: No focal deficit present.     Mental Status: He is alert and oriented to person, place, and time.     Cranial Nerves: Cranial nerves 2-12 are intact.     Sensory: Sensation is intact.     Motor: Motor function is intact.     Gait: Gait is intact.  Psychiatric:        Attention and Perception: He is inattentive.        Mood and Affect: Mood is depressed. Affect is blunt and flat.        Speech: Speech normal.        Behavior: Behavior is slowed.        Thought Content: Thought content is not paranoid or delusional. Thought content includes suicidal ideation. Thought content does not include homicidal ideation. Thought content includes suicidal plan. Thought content does not include homicidal plan.     ED Results / Procedures / Treatments   Labs (all labs ordered are listed, but only abnormal results are displayed) Labs Reviewed  COMPREHENSIVE METABOLIC PANEL - Abnormal; Notable for the following components:      Result Value   Total Protein 6.1 (*)    All other components within normal limits  CBC WITH DIFFERENTIAL/PLATELET - Abnormal; Notable for the following components:   HCT 38.3 (*)    All other components within normal limits  SALICYLATE LEVEL - Abnormal; Notable for the following components:   Salicylate Lvl <7.0 (*)    All other components within normal limits  ACETAMINOPHEN LEVEL - Abnormal; Notable for the following components:   Acetaminophen (Tylenol), Serum <10 (*)    All other components within normal limits  MAGNESIUM - Abnormal; Notable for the following components:   Magnesium 1.6 (*)    All other  components within normal limits  ACETAMINOPHEN LEVEL - Abnormal; Notable for the following components:   Acetaminophen (Tylenol), Serum <10 (*)    All other components within normal limits  RESP PANEL BY RT-PCR (FLU A&B, COVID) [id number]  ETHANOL  RAPID URINE DRUG SCREEN, HOSP PERFORMED    EKG EKG Interpretation  Date/Time:  Wednesday February 05 2022 18:27:02 EDT Ventricular Rate:  85 PR Interval:  141 QRS Duration: 78 QT Interval:  368 QTC Calculation: 438 R Axis:   71 Text Interpretation: Sinus rhythm no acute ST/T changes Confirmed by Pricilla Loveless (  [id number]) on 02/05/2022 6:47:27 PM  Radiology No results found.  Procedures Procedures    Medications Ordered in ED Medications  lactated ringers bolus 1,000 mL (0 mLs Intravenous Stopped 02/05/22 1954)  magnesium sulfate IVPB 2 g 50 mL (0 g Intravenous Stopped 02/05/22 1945)  potassium chloride 10 mEq in 100 mL IVPB (0 mEq Intravenous Stopped 02/05/22 2057)    ED Course/ Medical Decision Making/ A&P    21 year old with extensive psychiatric history here for evaluation of intentional overdose, history of similar requiring ICU admission for recurrent seizures.  Admits to increasing depression.  Apparently, plan to verbal altercation with mother went to his room and took "a few" pills.  Initially told me he took Prozac and Klonopin however he told EMS he took Prozac and Risperdal.  He is unsure the total amount however states it was "only a few."  Apparently had some abnormal body movements with EMS however, cooperative with myself.  Does admit to intent at self-harm.  He denies any current complaints.  Currently reading his hand-held Bible.  He denies any alcohol or substance use.  Control was called.  Peak risk 4 to 6 hours.  Recommend repeat EKG Tylenol level at 2000, needs to be monitored for arrhythmias.  Recommend potassium magnesium to high normal, rec giving additional Mag and K here given current levels.  Can treat seizures or  extraparametal symptoms with benzos or Benadryl.  He can be medically cleared at 8 hours.  Labs and imaging personally viewed and interpreted:  CBC without leukocytosis Metabolic panel without significant abnormality, potassium 3.6, will give supplementation per poison control recommendations Magnesium 1.6, will give supplementation per poison control recommendations COVID negative Acetaminophen, ethanol, salicylate within normal limits EKG QTC 452, QRS 168  Patient reassessed. Calm. No serotonin symptoms, no obvious serotonin syndrome  Patient reassessed.  Has been ambulatory in room without difficulty.  No obvious distress. Will be medically cleared at [facility]  Patient under IVC                            Medical Decision Making Amount and/or Complexity of Data Reviewed Independent Historian: EMS External Data Reviewed: labs, ECG and notes. Labs: ordered. Decision-making details documented in ED Course. ECG/medicine tests: ordered and independent interpretation performed. Decision-making details documented in ED Course.  Risk OTC drugs. Prescription drug management. Parenteral controlled substances. Diagnosis or treatment significantly limited by social determinants of health.          Final Clinical Impression(s) / ED Diagnoses Final diagnoses:  Intentional drug overdose, initial encounter [facility])  Involuntary commitment  Depression, unspecified depression type    Rx / DC Orders ED Discharge Orders     None         Rosibel Giacobbe A, PA-C 02/05/22 2338    Sherwood Gambler, MD 02/08/22 [phone]

## 2022-02-05 NOTE — ED Notes (Signed)
Sandwich and juice given.

## 2022-02-05 NOTE — ED Triage Notes (Signed)
Pt coming from home with c/o overdose on Prozac and Risperdal. Pt told PA that he overdosed on Prozac and clonopin. Pt unsure of how many he took and says "a few." Pt reports that his mom was getting on his nerves beforehand and is unsure if he did this to cause harm to himself. Pt currently struggling with stuttering, making abnormal movements with his body occasionally, making abnormal movements with his mouth, and reading his hand bible. Hx of intentional OD.

## 2022-02-05 NOTE — ED Notes (Addendum)
Poison control called. Per poison control, watch for the following symptoms Clonopin (4hrs peak window for risk): lethargy Prozac (6hrs peak ""): n/v, lethargy, blurry vision, dizziness, tachycardia, HTN, seizures, QTC prolongation Risperdal(4 hrs peak ""): CNS depression, tachycardia, QTC prolongation, seizures, extra pyramidal symptoms, hyperthermia.  Recommendations: repeat EKG at 0000. 2000 repeat Tylenol level, keep on monitor, bring potassium and magnesium to high normal, treat seizures and extra pyramidal symptoms with benzos- can also treat extra pyramidal symptoms with benadryl.

## 2022-02-05 NOTE — ED Notes (Signed)
[name] RN from poison control called to check on patient will call back at [phone]

## 2022-02-05 NOTE — ED Notes (Signed)
Patient currently drooling.

## 2022-02-06 DIAGNOSIS — F319 Bipolar disorder, unspecified: Secondary | ICD-10-CM

## 2022-02-06 MED ORDER — FLUOXETINE HCL 10 MG PO CAPS: 10.0000 mg | ORAL_CAPSULE | Freq: Every day | ORAL | Status: DC

## 2022-02-06 MED ORDER — RISPERIDONE 1 MG PO TABS
3.0000 mg | ORAL_TABLET | Freq: Every day | ORAL | Status: DC
Start: 2022-02-07 — End: 2022-02-07

## 2022-02-06 NOTE — ED Notes (Signed)
Pt in room with eyes closed no signs of distress

## 2022-02-06 NOTE — ED Notes (Deleted)
Poison Control called and per Patty RN Taking (20) 50mg Trazodone will result in hypotension, QTC prolongation, seizures.   Give fluids and/or Norepinephrine for hypotension and Benzos for sieizures   If have QTC >500 replace K+ to 4 and Mg+ to 2   Get a 4 hour post ingestion Tylenol level 

## 2022-02-06 NOTE — ED Provider Notes (Signed)
Patient received at shift change from britni henderly PA-C please see note for full detail  In short patient with medical history including schizophrenia, bipolar, intentional drug overdose, suicide attempt, depression, presents with suicidal attempt, apparently patient took a "few" Klonopin pills this was an attempt to hurt himself.  Previous provider spoke with mother who states that patient only took 1 Prozac and 1 risperdal, per poison control patient should be observed for 8 hours.   Per previous provider patient will be medically cleared at 1 AM, TTS has been consulted, he is IVC and recommends psych hold.  Lab work was reviewed states that he had a low magnesium he was given supplemental magnesium while in the ED.  All other lab work was unremarkable.  Patient was reassessed resting comfortably having no complaints vital signs reassuring.  Home meds have been ordered, patient was placed in psych hold, and awaiting TTS recommendations.       Carroll Sage, PA-C 02/06/22 Kandyce Rud, April, MD 02/06/22 [id number]

## 2022-02-06 NOTE — Consult Note (Signed)
BH ED ASSESSMENT   Reason for Consult:  Suicide Attempt Referring Physician:  Berle Mull, PA-C Patient Identification: Billy Rose MRN:  [id number] ED Chief Complaint: Bipolar disorder with psychotic features [facility])  Diagnosis:  Principal Problem:   Bipolar disorder    ED Assessment Time Calculation: Start Time: 0945 Stop Time: 1010 Total Time in Minutes (Assessment Completion): 25   Subjective:   Billy Rose is a 21 y.o. male patient admitted with a history of bipolar disorder and a suicide attempt who presented to [facility] on 09/06 after overdosing on prozac and klonopin in a suicide attempt.   HPI:   On evaluation this morning, the patient is drowsy, easily aroused and able to participate in the assessment. Patient denies that he overdosed in a suicide attempt. He denies current suicidal ideations. He reports a suicide attempt by overdose in 04/2021 and 09/2020. He denies AVH. No indication that he is responding to internal stimuli.  He denies HI. Patient provided conflicting information regarding substance use. He states that he drinks alcohol and uses marijuana occasionally, then states that he uses them every daily, then reverts back to saying he uses them occasionally. He denies use of other substances. Patient also reports conflicting information to staff and providers regarding how much he took and what medications he overdosed on. Patient requests discharge. Informed that we would need to contact collateral. Patient provided consent to contact his mother.  Patient's mother reports that she went to visit the patient yesterday and "he was leaning to one side an appeared contorted." She states that she asked him he had used any drugs or taken anything and he became irritable. States that a crisis team responded and encouraged her to petition for IVC; however, he did not meet IVC criteria at that time. She states that later in the evening he barricaded himself in the bathroom. When he  eventually opened the door he could not stand, was drooling, and was having difficulty breathing. She states that EMS responded and at some point he told them he overdosed on Prozac and Risperdal. She states that she is not sure if he actually overdosed, but she does not feel he is safe for discharge at this time. She reports that the patient has been hyper religious-constantly reading his bible, standing in grocery stores preaching, and going up to strangers to talk about religion.   Past Psychiatric History: Bipolar Disorder, MDD with psychotic features, substance induced psychosis, and suicide attempt. Patient was inpatient at [facility] 01/2017, 08/2018, 12/2018, 05/2020, and 09/2020.   Risk to Self or Others: Is the patient at risk to self? Yes Has the patient been a risk to self in the past 6 months? Yes Has the patient been a risk to self within the distant past? Yes Is the patient a risk to others? No Has the patient been a risk to others in the past 6 months? No Has the patient been a risk to others within the distant past? No  [place] Scale:  Flowsheet Row ED from 02/05/2022 in [facility] ED to Hosp-Admission (Discharged) from 04/16/2021 in [facility] from 11/14/2020 in [facility] EMERGENCY DEPARTMENT  C-SSRS RISK CATEGORY High Risk High Risk Error: Question 6 not populated       AIMS:  , , ,  ,   ASAM:    Substance Abuse:  Alcohol / Drug Use Pain Medications: See MAR Prescriptions: See MAR Over the Counter: See MAR History of alcohol /  drug use?: No history of alcohol / drug abuse Longest period of sobriety (when/how long): 6 months Negative Consequences of Use: Financial, Personal relationships Withdrawal Symptoms:  (Denies)  Past Medical History:  Past Medical History:  Diagnosis Date   Headache    MDD (major depressive disorder), recurrent, severe, with psychosis (HCC)    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 No pertinent surgical history. Family History:  Family History  Problem Relation Age of Onset   Allergic rhinitis Neg Hx    Asthma Neg Hx    Eczema Neg Hx    Urticaria Neg Hx    Social History:  Social History   Substance and Sexual Activity  Alcohol Use Yes   Comment: occ     Social History   Substance and Sexual Activity  Drug Use Not Currently   Types: Cocaine, LSD, Marijuana, IV    Social History   Socioeconomic History   Marital status: Single    Spouse name: Not on file   Number of children: Not on file   Years of education: Not on file   Highest education level: Not on file  Occupational History   Not on file  Tobacco Use   Smoking status: Every Day    Types: Cigarettes   Smokeless tobacco: Never  Vaping Use   Vaping Use: Never used  Substance and Sexual Activity   Alcohol use: Yes    Comment: occ   Drug use: Not Currently    Types: Cocaine, LSD, Marijuana, IV   Sexual activity: Not on file  Other Topics Concern   Not on file  Social History Narrative   ** Merged History Encounter **       Social Determinants of Health   Financial Resource Strain: Not on file  Food Insecurity: Not on file  Transportation Needs: Not on file  Physical Activity: Not on file  Stress: Not on file  Social Connections: Not on file   Additional Social History:    Allergies:   Allergies  Allergen Reactions   Penicillins Anaphylaxis, Swelling and Other (See Comments)    Did it involve swelling of the face/tongue/throat, SOB, or low BP? Yes Did it involve sudden or severe rash/hives, skin peeling, or any reaction on the inside of your mouth or nose? Unk Did you need to seek medical attention at a hospital or doctor's office? Unk When did it last happen? Childhood If all above answers are "NO", may proceed with cephalosporin use.   Sulfa Antibiotics Anaphylaxis, Shortness Of  Breath, Swelling, Rash and Other (See Comments)    Throat and face swell   Abilify [Aripiprazole] Other (See Comments)    Worsened the patient    Labs:  Results for orders placed or performed during the hospital encounter of 02/05/22 (from the past 48 hour(s))  Resp Panel by RT-PCR (Flu A&B, Covid) Anterior Nasal Swab     Status: None   Collection Time: 02/05/22  5:09 PM   Specimen: Anterior Nasal Swab  Result Value Ref Range   SARS Coronavirus 2 by RT PCR NEGATIVE NEGATIVE    Comment: (NOTE) SARS-CoV-2 target nucleic acids are NOT DETECTED.  The SARS-CoV-2 RNA is generally detectable in upper respiratory specimens during the acute phase of infection. The lowest concentration of SARS-CoV-2 viral copies this assay can detect is 138 copies/mL. A negative result does not preclude SARS-Cov-2 infection and should not be used as the sole  basis for treatment or other patient management decisions. A negative result may occur with  improper specimen collection/handling, submission of specimen other than nasopharyngeal swab, presence of viral mutation(s) within the areas targeted by this assay, and inadequate number of viral copies(<138 copies/mL). A negative result must be combined with clinical observations, patient history, and epidemiological information. The expected result is Negative.  Fact Sheet for Patients:  BloggerCourse.com  Fact Sheet for Healthcare Providers:  SeriousBroker.it  This test is no t yet approved or cleared by the [place] FDA and  has been authorized for detection and/or diagnosis of SARS-CoV-2 by FDA under an Emergency Use Authorization (EUA). This EUA will remain  in effect (meaning this test can be used) for the duration of the COVID-19 declaration under Section 564(b)(1) of the Act, 21 U.S.C.section 360bbb-3(b)(1), unless the authorization is terminated  or revoked sooner.       Influenza A by PCR  NEGATIVE NEGATIVE   Influenza B by PCR NEGATIVE NEGATIVE    Comment: (NOTE) The Xpert Xpress SARS-CoV-2/FLU/RSV plus assay is intended as an aid in the diagnosis of influenza from Nasopharyngeal swab specimens and should not be used as a sole basis for treatment. Nasal washings and aspirates are unacceptable for Xpert Xpress SARS-CoV-2/FLU/RSV testing.  Fact Sheet for Patients: BloggerCourse.com  Fact Sheet for Healthcare Providers: SeriousBroker.it  This test is not yet approved or cleared by the [place] FDA and has been authorized for detection and/or diagnosis of SARS-CoV-2 by FDA under an Emergency Use Authorization (EUA). This EUA will remain in effect (meaning this test can be used) for the duration of the COVID-19 declaration under Section 564(b)(1) of the Act, 21 U.S.C. section 360bbb-3(b)(1), unless the authorization is terminated or revoked.  Performed at [facility], [address] [place], [place] [address]   Comprehensive metabolic panel     Status: Abnormal   Collection Time: 02/05/22  5:09 PM  Result Value Ref Range   Sodium 140 135 - 145 mmol/L   Potassium 3.6 3.5 - 5.1 mmol/L   Chloride 110 98 - 111 mmol/L   CO2 23 22 - 32 mmol/L   Glucose, Bld 98 70 - 99 mg/dL    Comment: Glucose reference range applies only to samples taken after fasting for at least 8 hours.   BUN 10 6 - 20 mg/dL   Creatinine, Ser 8.54 0.61 - 1.24 mg/dL   Calcium 8.9 8.9 - 62.7 mg/dL   Total Protein 6.1 (L) 6.5 - 8.1 g/dL   Albumin 3.9 3.5 - 5.0 g/dL   AST 17 15 - 41 U/L   ALT 17 0 - 44 U/L   Alkaline Phosphatase 52 38 - 126 U/L   Total Bilirubin 0.8 0.3 - 1.2 mg/dL   GFR, Estimated >03 >50 mL/min    Comment: (NOTE) Calculated using the CKD-EPI Creatinine Equation (2021)    Anion gap 7 5 - 15    Comment: Performed at [facility], [address], [place], [place] [address]  CBC with Diff      Status: Abnormal   Collection Time: 02/05/22  5:09 PM  Result Value Ref Range   WBC 7.0 4.0 - 10.5 K/uL   RBC 4.25 4.22 - 5.81 MIL/uL   Hemoglobin 13.1 13.0 - 17.0 g/dL   HCT 82.9 (L) 93.7 - 16.9 %   MCV 90.1 80.0 - 100.0 fL   MCH 30.8 26.0 - 34.0 pg   MCHC 34.2 30.0 - 36.0 g/dL  RDW 12.2 11.5 - 15.5 %   Platelets 191 150 - 400 K/uL   nRBC 0.0 0.0 - 0.2 %   Neutrophils Relative % 73 %   Neutro Abs 5.1 1.7 - 7.7 K/uL   Lymphocytes Relative 18 %   Lymphs Abs 1.3 0.7 - 4.0 K/uL   Monocytes Relative 6 %   Monocytes Absolute 0.4 0.1 - 1.0 K/uL   Eosinophils Relative 3 %   Eosinophils Absolute 0.2 0.0 - 0.5 K/uL   Basophils Relative 0 %   Basophils Absolute 0.0 0.0 - 0.1 K/uL   Immature Granulocytes 0 %   Abs Immature Granulocytes 0.01 0.00 - 0.07 K/uL    Comment: Performed at [facility], [address] [place], [place] [address]  Magnesium     Status: Abnormal   Collection Time: 02/05/22  5:09 PM  Result Value Ref Range   Magnesium 1.6 (L) 1.7 - 2.4 mg/dL    Comment: Performed at [facility], [address] [place], [place] [address]  Ethanol     Status: None   Collection Time: 02/05/22  5:10 PM  Result Value Ref Range   Alcohol, Ethyl (B) <10 <10 mg/dL    Comment: (NOTE) Lowest detectable limit for serum alcohol is 10 mg/dL.  For medical purposes only. Performed at [facility], [address] [place], [place] [address]   Salicylate level     Status: Abnormal   Collection Time: 02/05/22  5:10 PM  Result Value Ref Range   Salicylate Lvl <7.0 (L) 7.0 - 30.0 mg/dL    Comment: Performed at [facility], [address] [place], [place] [address]  Acetaminophen level     Status: Abnormal   Collection Time: 02/05/22  5:10 PM  Result Value Ref Range   Acetaminophen (Tylenol), Serum <10 (L) 10 - 30 ug/mL    Comment: (NOTE) Therapeutic concentrations vary significantly. A range of 10-30 ug/mL   may be an effective concentration for many patients. However, some  are best treated at concentrations outside of this range. Acetaminophen concentrations >150 ug/mL at 4 hours after ingestion  and >50 ug/mL at 12 hours after ingestion are often associated with  toxic reactions.  Performed at [facility], [address] [place], [place] [address]   Urine rapid drug screen (hosp performed)     Status: None   Collection Time: 02/05/22  7:45 PM  Result Value Ref Range   Opiates NONE DETECTED NONE DETECTED   Cocaine NONE DETECTED NONE DETECTED   Benzodiazepines NONE DETECTED NONE DETECTED   Amphetamines NONE DETECTED NONE DETECTED   Tetrahydrocannabinol NONE DETECTED NONE DETECTED   Barbiturates NONE DETECTED NONE DETECTED    Comment: (NOTE) DRUG SCREEN FOR MEDICAL PURPOSES ONLY.  IF CONFIRMATION IS NEEDED FOR ANY PURPOSE, NOTIFY LAB WITHIN 5 DAYS.  LOWEST DETECTABLE LIMITS FOR URINE DRUG SCREEN Drug Class                     Cutoff (ng/mL) Amphetamine and metabolites    1000 Barbiturate and metabolites    200 Benzodiazepine                 200 Tricyclics and metabolites     300 Opiates and metabolites        300 Cocaine and metabolites        300 THC  50 Performed at [facility], [address], [place], [place] [address]   Acetaminophen level     Status: Abnormal   Collection Time: 02/05/22  9:10 PM  Result Value Ref Range   Acetaminophen (Tylenol), Serum <10 (L) 10 - 30 ug/mL    Comment: (NOTE) Therapeutic concentrations vary significantly. A range of 10-30 ug/mL  may be an effective concentration for many patients. However, some  are best treated at concentrations outside of this range. Acetaminophen concentrations >150 ug/mL at 4 hours after ingestion  and >50 ug/mL at 12 hours after ingestion are often associated with  toxic reactions.  Performed at [facility], [address], [place], [place] [address]     Current Facility-Administered Medications  Medication Dose Route Frequency Provider Last Rate Last Admin   [START ON 02/07/2022] FLUoxetine (PROZAC) capsule 10 mg  10 mg Oral Daily Carroll Sage, [place]       [START ON 02/07/2022] risperiDONE (RISPERDAL) tablet 3 mg  3 mg Oral QHS Carroll Sage, PA-C       Current Outpatient Medications  Medication Sig Dispense Refill   FLUoxetine (PROZAC) 10 MG capsule Take 10 mg by mouth daily.     risperiDONE (RISPERDAL) 3 MG tablet Take 3 mg by mouth at bedtime.      Musculoskeletal: Strength & Muscle Tone: within normal limits Gait & Station: normal Patient leans: N/A   Psychiatric Specialty Exam: Presentation  General Appearance: Appropriate for Environment  Eye Contact:Fair  Speech:Clear and Coherent; Normal Rate  Speech Volume:Normal  Handedness:Right   Mood and Affect  Mood:Depressed  Affect:Congruent   Thought Process  Thought Processes:Coherent; Linear  Descriptions of Associations:Intact  Orientation:Full (Time, Place and Person)  Thought Content:Logical  History of Schizophrenia/Schizoaffective disorder:No data recorded Duration of Psychotic Symptoms:No data recorded Hallucinations:Hallucinations: None  Ideas of Reference:None  Suicidal Thoughts:Suicidal Thoughts: No  Homicidal Thoughts:Homicidal Thoughts: No   Sensorium  Memory:Immediate Fair; Recent Fair  Judgment:Impaired  Insight:Lacking   Executive Functions  Concentration:Fair  Attention Span:Fair  Recall:Fair  Fund of Knowledge:Fair  Language:Good  Psychomotor Activity  Psychomotor Activity:Psychomotor Activity: Normal  Assets  Assets:Communication Skills; Desire for Improvement; Financial Resources/Insurance; Housing; Physical Health    Sleep  Sleep:Sleep: Fair  Physical Exam: Physical Exam Constitutional:      General: He is not in acute distress.    Appearance: He is not  ill-appearing, toxic-appearing or diaphoretic.  Neurological:     Mental Status: He is alert.  Psychiatric:        Mood and Affect: Mood is anxious and depressed.        Behavior: Behavior is cooperative.        Thought Content: Thought content does not include suicidal ideation.    Review of Systems  Constitutional:  Negative for chills, diaphoresis, fever, malaise/fatigue and weight loss.  HENT:  Negative for congestion.   Respiratory:  Negative for cough and shortness of breath.   Cardiovascular:  Negative for chest pain and palpitations.  Gastrointestinal:  Negative for diarrhea, nausea and vomiting.  Neurological:  Negative for dizziness and seizures.  Psychiatric/Behavioral:  Positive for depression and suicidal ideas. Negative for hallucinations and memory loss. The patient is nervous/anxious and has insomnia.   All other systems reviewed and are negative.  Blood pressure 103/61, pulse 65, temperature 98.9 F (37.2 C), temperature source Oral, resp. rate 18, height 5\' 11"  (1.803 m), weight 59 kg, SpO2 97 %. Body mass index is 18.13 kg/m.  Medical Decision Making: Patient presented to [facility] after an intentional overdose in a suicide attempt. Patient recommended for inpatient treatment. Patient has been accepted for transfer to [facility].    Disposition: Recommend psychiatric Inpatient admission when medically cleared.  Jackelyn Poling, NP 02/06/2022 8:14 PM

## 2022-02-06 NOTE — Progress Notes (Addendum)
Pt was accepted to [facility] TODAY 02/06/22; Bed Assignment 503-1  Pt meets inpatient criteria per Nira Conn, NP  Attending Physician will be Dr. Phineas Inches  Report can be called to: - Child and Adolescence unit: [phone] -Adult unit: [phone]  Pt can arrive after 8:00pm  Care Team notified: [facility] [place], RN, [facility] Rosey Bath, RN, [facility] Rosey Bath, RN, Sharol Roussel, RN, [place], [place], Nira Conn, NP   [place], LCSWA 02/06/2022 @ 7:25 PM

## 2022-02-06 NOTE — ED Notes (Signed)
Family updated as to patient's status.
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
## 2022-02-06 NOTE — ED Notes (Signed)
Patient with IVC  on 02/05/22 and first exam is done, expires on 02/12/22.

## 2022-02-07 ENCOUNTER — Inpatient Hospital Stay (HOSPITAL_COMMUNITY)
Admission: AD | Admit: 2022-02-07 | Discharge: 2022-02-14 | DRG: 885 | Disposition: A | Discharge status: Home / Self Care | Payer: Medicaid Other | Source: Intra-hospital | Attending: Emergency Medicine | Admitting: Emergency Medicine

## 2022-02-07 ENCOUNTER — Encounter (HOSPITAL_COMMUNITY): Payer: Self-pay

## 2022-02-07 ENCOUNTER — Other Ambulatory Visit: Payer: Self-pay

## 2022-02-07 ENCOUNTER — Encounter (HOSPITAL_COMMUNITY): Payer: Self-pay | Admitting: Nurse Practitioner

## 2022-02-07 DIAGNOSIS — F3164 Bipolar disorder, current episode mixed, severe, with psychotic features: Secondary | ICD-10-CM

## 2022-02-07 DIAGNOSIS — Z79899 Other long term (current) drug therapy: Secondary | ICD-10-CM

## 2022-02-07 DIAGNOSIS — G4700 Insomnia, unspecified: Secondary | ICD-10-CM | POA: Diagnosis present

## 2022-02-07 DIAGNOSIS — F319 Bipolar disorder, unspecified: Secondary | ICD-10-CM | POA: Diagnosis present

## 2022-02-07 DIAGNOSIS — F17210 Nicotine dependence, cigarettes, uncomplicated: Secondary | ICD-10-CM | POA: Diagnosis present

## 2022-02-07 DIAGNOSIS — F411 Generalized anxiety disorder: Secondary | ICD-10-CM | POA: Diagnosis present

## 2022-02-07 DIAGNOSIS — Z91148 Patient's other noncompliance with medication regimen for other reason: Secondary | ICD-10-CM

## 2022-02-07 DIAGNOSIS — F4310 Post-traumatic stress disorder, unspecified: Secondary | ICD-10-CM | POA: Diagnosis present

## 2022-02-07 DIAGNOSIS — Z818 Family history of other mental and behavioral disorders: Secondary | ICD-10-CM

## 2022-02-07 DIAGNOSIS — F250 Schizoaffective disorder, bipolar type: Secondary | ICD-10-CM | POA: Diagnosis present

## 2022-02-07 DIAGNOSIS — F410 Panic disorder [episodic paroxysmal anxiety] without agoraphobia: Secondary | ICD-10-CM | POA: Diagnosis present

## 2022-02-07 DIAGNOSIS — Z9151 Personal history of suicidal behavior: Secondary | ICD-10-CM | POA: Diagnosis not present

## 2022-02-07 LAB — LIPID PANEL
Cholesterol: 128 mg/dL (ref 0–200)
HDL: 51 mg/dL (ref >40)
LDL Cholesterol: 61 mg/dL (ref 0–99)
Total CHOL/HDL Ratio: 2.5 RATIO
Triglycerides: 81 mg/dL (ref <150)
VLDL: 16 mg/dL (ref 0–40)

## 2022-02-07 LAB — TSH: TSH: 1.168 u[IU]/mL (ref 0.350–4.500)

## 2022-02-07 MED ORDER — TRAZODONE HCL 50 MG PO TABS
50.0000 mg | ORAL_TABLET | Freq: Every evening | ORAL | Status: DC | PRN
  Administered 2022-02-07 – 2022-02-13 (×3): 50 mg via ORAL
  Filled 2022-02-07 (×5): qty 1

## 2022-02-07 MED ORDER — NICOTINE POLACRILEX 2 MG MT GUM
2.0000 mg | CHEWING_GUM | OROMUCOSAL | Status: DC | PRN
Start: 2022-02-07 — End: 2022-02-07

## 2022-02-07 MED ORDER — FLUOXETINE HCL 10 MG PO CAPS
10.0000 mg | ORAL_CAPSULE | Freq: Every day | ORAL | Status: DC
  Administered 2022-02-07 – 2022-02-14 (×8): 10 mg via ORAL
  Filled 2022-02-07 (×11): qty 1

## 2022-02-07 MED ORDER — HALOPERIDOL 5 MG PO TABS
5.0000 mg | ORAL_TABLET | Freq: Two times a day (BID) | ORAL | Status: DC
  Administered 2022-02-07 – 2022-02-10 (×6): 5 mg via ORAL
  Filled 2022-02-07 (×10): qty 1

## 2022-02-07 MED ORDER — HALOPERIDOL LACTATE 5 MG/ML IJ SOLN: 5.0000 mg | Freq: Four times a day (QID) | INTRAMUSCULAR | Status: DC | PRN

## 2022-02-07 MED ORDER — LORAZEPAM 2 MG/ML IJ SOLN: 2.0000 mg | Freq: Four times a day (QID) | INTRAMUSCULAR | Status: DC | PRN

## 2022-02-07 MED ORDER — BENZTROPINE MESYLATE 1 MG PO TABS: 1.0000 mg | ORAL_TABLET | Freq: Two times a day (BID) | ORAL | Status: DC | PRN

## 2022-02-07 MED ORDER — DIPHENHYDRAMINE HCL 25 MG PO CAPS: 25.0000 mg | ORAL_CAPSULE | Freq: Three times a day (TID) | ORAL | Status: DC | PRN

## 2022-02-07 MED ORDER — HALOPERIDOL 5 MG PO TABS: 5.0000 mg | ORAL_TABLET | Freq: Three times a day (TID) | ORAL | Status: DC | PRN

## 2022-02-07 MED ORDER — LORAZEPAM 1 MG PO TABS: 2.0000 mg | ORAL_TABLET | Freq: Three times a day (TID) | ORAL | Status: DC | PRN

## 2022-02-07 MED ORDER — RISPERIDONE 1 MG PO TABS
1.0000 mg | ORAL_TABLET | Freq: Every day | ORAL | Status: DC
  Filled 2022-02-07 (×2): qty 1

## 2022-02-07 MED ORDER — ACETAMINOPHEN 325 MG PO TABS: 650.0000 mg | ORAL_TABLET | Freq: Four times a day (QID) | ORAL | Status: DC | PRN

## 2022-02-07 MED ORDER — DIPHENHYDRAMINE HCL 50 MG/ML IJ SOLN: 50.0000 mg | Freq: Four times a day (QID) | INTRAMUSCULAR | Status: DC | PRN

## 2022-02-07 MED ORDER — MAGNESIUM HYDROXIDE 400 MG/5ML PO SUSP: 30.0000 mL | Freq: Every day | ORAL | Status: DC | PRN

## 2022-02-07 MED ORDER — HYDROXYZINE HCL 25 MG PO TABS
25.0000 mg | ORAL_TABLET | Freq: Three times a day (TID) | ORAL | Status: DC | PRN
  Administered 2022-02-07 – 2022-02-13 (×3): 25 mg via ORAL
  Filled 2022-02-07 (×5): qty 1

## 2022-02-07 MED ORDER — NICOTINE 14 MG/24HR TD PT24
14.0000 mg | MEDICATED_PATCH | Freq: Every day | TRANSDERMAL | Status: DC
  Filled 2022-02-07 (×10): qty 1

## 2022-02-07 MED ORDER — ENSURE ENLIVE PO LIQD
237.0000 mL | Freq: Two times a day (BID) | ORAL | Status: DC
  Administered 2022-02-08 – 2022-02-14 (×13): 237 mL via ORAL
  Filled 2022-02-07 (×15): qty 237

## 2022-02-07 MED ORDER — ALUM & MAG HYDROXIDE-SIMETH 200-200-20 MG/5ML PO SUSP: 30.0000 mL | ORAL | Status: DC | PRN

## 2022-02-07 NOTE — Tx Team (Signed)
Initial Treatment Plan 02/07/2022 3:38 AM [name] EAV:[id number]    PATIENT STRESSORS: Other: "Nothing"     PATIENT STRENGTHS: Supportive family/friends    PATIENT IDENTIFIED PROBLEMS: Suicidal  Depression  Anxiety      "Calming down"  "Depression"         DISCHARGE CRITERIA:  Motivation to continue treatment in a less acute level of care Reduction of life-threatening or endangering symptoms to within safe limits Safe-care adequate arrangements made  PRELIMINARY DISCHARGE PLAN: Outpatient therapy  PATIENT/FAMILY INVOLVEMENT: This treatment plan has been presented to and reviewed with the patient, [name].  The patient and family have been given the opportunity to ask questions and make suggestions.  Marcie Bal, RN 02/07/2022, 3:38 AM

## 2022-02-07 NOTE — Progress Notes (Addendum)
   Initial Admission Note:  Billy Rose is a 21 year old male being admitted involuntarily to [facility] on today.  Pt is diagnosed with Schizophrenia with an intentional overdose on 02/05/22.  Pt reports anxiety 5/10 and depression 7/10. Pt denies SI/HI/VH.  Pt endorses AH and describes as "people in hell talking."  Pt is negative for COVID and has a negative UDS.   Previous notes indicate that prior to pt's current admit, pt presented voluntarily to [facility] due to SI with attempted overdose on Prozac and Klonopin. Patient has history of schizophrenia, bipolar, prior intentional overdose, suicide attempt, depression and substance use psychotic disorder. Patient denied this was a suicide attempt with TTS clinician, stating he only took 1 of each, however per chart he reported to triage nurse that he took a few and was unsure of the amount. When asked, why did you take the pills, patient stated "so I can sleep. Per EMS bottles from Prozac and Risperdal refilled in early July and had 2 to 3 pills left in each of them and admits to this being an attempt at self-harm. Patient reported that his mother was getting on his nerves beforehand and is unsure if he did this to cause harm to himself.    Pt also reports self-harm in the form of cutting that he no longer does.  Pt states stressor are, "nothing."  Pt triggers are "not much."  Pt identifies his mom, Taelyn Nemes as his family support person.  During skin search, no contraband was found.  Pt skin is intact, however an old healed cut on his left arm and wrist was noted.  All admission paperwork was signed and completed.  Pt was oriented to the unit.  Pt is alert.  Pt was provided food and drink.  Pt presently remains safe on the unit with Q 15 minute safety checks.
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
## 2022-02-07 NOTE — Progress Notes (Signed)
Recreation Therapy Notes  INPATIENT RECREATION THERAPY ASSESSMENT  Patient Details Name: Rowen Wilmer MRN: [id number] DOB: [birth year] Today's Date: 02/07/2022       Information Obtained From: Patient  Able to Participate in Assessment/Interview: Yes  Patient Presentation: Alert Birder Robson)  Reason for Admission (Per Patient): Other (Comments) ("I upset my mom by being too loud")  Patient Stressors:  (None)  Coping Skills:   Isolation, Music, Exercise, Deep Breathing, Meditate, Talk, Art, Prayer, Avoidance, Read, Hot Bath/Shower  Leisure Interests (2+):  Individual - Other (Comment) (Pray)  Frequency of Recreation/Participation: Other (Comment) (Daily)  Awareness of Community Resources:  Yes  Community Resources:  Church, Coffee Shop, Research scientist (physical sciences)  Current Use: Yes  If no, Barriers?:    Expressed Interest in [employer] Information: No  County of Residence:  [place]  Patient Main Form of Transportation: Walk  Patient Strengths:  Publishing copy, reading the bible  Patient Identified Areas of Improvement:  "making money"  Patient Goal for Hospitalization:  "get better"  Current SI (including self-harm):  No  Current HI:  No  Current AVH: No  Staff Intervention Plan: Group Attendance, Collaborate with Interdisciplinary Treatment Team  Consent to Intern Participation: N/A   Caroll Rancher, Richardean Sale, [place] A 02/07/2022, 12:38 PM

## 2022-02-07 NOTE — BHH Group Notes (Signed)
Pt was encouraged but opted out of attending wrap up group this evening. 

## 2022-02-07 NOTE — Progress Notes (Signed)
     02/07/22 0211  Psych Admission Type (Psych Patients Only)  Admission Status Involuntary  Psychosocial Assessment  Patient Complaints Agitation;Anger;Anxiety;Depression;Restlessness;Self-harm behaviors  Eye Contact Fair  Facial Expression Anxious  Affect Flat  Speech Logical/coherent  Interaction Assertive  Motor Activity Other (Comment) (WDL)  Appearance/Hygiene In scrubs  Behavior Characteristics Cooperative  Mood Depressed;Anxious  Thought Process  Coherency Blocking  Content WDL  Delusions Religious  Perception Hallucinations  Hallucination Auditory  Judgment Poor  Confusion None  Danger to Self  Current suicidal ideation? Denies (Denies)  Agreement Not to Harm Self Yes  Description of Agreement verbal  Danger to Others  Danger to Others None reported or observed

## 2022-02-07 NOTE — Progress Notes (Signed)
   02/07/22 1930  Psych Admission Type (Psych Patients Only)  Admission Status Involuntary  Psychosocial Assessment  Patient Complaints Anxiety;Suspiciousness;Self-harm thoughts  Eye Contact Fair  Facial Expression Anxious  Affect Flat  Speech Logical/coherent  Interaction Assertive  Motor Activity Pacing  Appearance/Hygiene Disheveled  Behavior Characteristics Cooperative  Mood Anxious  Aggressive Behavior  Effect No apparent injury  Thought Process  Coherency WDL  Content WDL  Delusions WDL  Perception WDL  Hallucination None reported or observed  Judgment Poor  Confusion None  Danger to Self  Current suicidal ideation? Denies  Danger to Others  Danger to Others None reported or observed

## 2022-02-07 NOTE — BH IP Treatment Plan (Signed)
Interdisciplinary Treatment and Diagnostic Plan Update  02/07/2022 Time of Session: [place] MRN: [id number]  Principal Diagnosis: Bipolar affective disorder ([place])  Secondary Diagnoses: Principal Problem:   Bipolar affective disorder ([place])   Current Medications:  Current Facility-Administered Medications  Medication Dose Route Frequency Provider Last Rate Last Admin   acetaminophen (TYLENOL) tablet 650 mg  650 mg Oral Q6H PRN Rozetta Nunnery, NP       alum & mag hydroxide-simeth (MAALOX/MYLANTA) 200-200-20 MG/5ML suspension 30 mL  30 mL Oral Q4H PRN Rozetta Nunnery, NP       benztropine (COGENTIN) tablet 1 mg  1 mg Oral BID PRN France Ravens, MD       haloperidol (HALDOL) tablet 5 mg  5 mg Oral Q8H PRN France Ravens, MD       And   LORazepam (ATIVAN) tablet 2 mg  2 mg Oral Q8H PRN France Ravens, MD       And   diphenhydrAMINE (BENADRYL) capsule 25 mg  25 mg Oral Q8H PRN France Ravens, MD       haloperidol lactate (HALDOL) injection 5 mg  5 mg Intramuscular Q6H PRN France Ravens, MD       And   LORazepam (ATIVAN) injection 2 mg  2 mg Intramuscular Q6H PRN France Ravens, MD       And   diphenhydrAMINE (BENADRYL) injection 50 mg  50 mg Intravenous Q6H PRN France Ravens, MD       FLUoxetine (PROZAC) capsule 10 mg  10 mg Oral Daily France Ravens, MD       haloperidol (HALDOL) tablet 5 mg  5 mg Oral BID France Ravens, MD       hydrOXYzine (ATARAX) tablet 25 mg  25 mg Oral TID PRN France Ravens, MD       magnesium hydroxide (MILK OF MAGNESIA) suspension 30 mL  30 mL Oral Daily PRN Lindon Romp A, NP       nicotine (NICODERM CQ - dosed in mg/24 hours) patch 14 mg  14 mg Transdermal Daily Evette Georges, NP       traZODone (DESYREL) tablet 50 mg  50 mg Oral QHS PRN Rozetta Nunnery, NP       PTA Medications: Medications Prior to Admission  Medication Sig Dispense Refill Last Dose   FLUoxetine (PROZAC) 10 MG capsule Take 10 mg by mouth daily.      risperiDONE (RISPERDAL) 3 MG tablet Take 3 mg by mouth at bedtime.        Patient Stressors: Other: "Nothing"    Patient Strengths: Supportive family/friends   Treatment Modalities: Medication Management, Group therapy, Case management,  1 to 1 session with clinician, Psychoeducation, Recreational therapy.   Physician Treatment Plan for Primary Diagnosis: Bipolar affective disorder ([place]) Long Term Goal(s): Improvement in symptoms so as ready for discharge   Short Term Goals: Ability to identify changes in lifestyle to reduce recurrence of condition will improve Ability to verbalize feelings will improve Ability to disclose and discuss suicidal ideas Ability to demonstrate self-control will improve  Medication Management: Evaluate patient's response, side effects, and tolerance of medication regimen.  Therapeutic Interventions: 1 to 1 sessions, Unit Group sessions and Medication administration.  Evaluation of Outcomes: Progressing  Physician Treatment Plan for Secondary Diagnosis: Principal Problem:   Bipolar affective disorder ([place])  Long Term Goal(s): Improvement in symptoms so as ready for discharge   Short Term Goals: Ability to identify changes in lifestyle to reduce recurrence of condition will improve Ability  to verbalize feelings will improve Ability to disclose and discuss suicidal ideas Ability to demonstrate self-control will improve     Medication Management: Evaluate patient's response, side effects, and tolerance of medication regimen.  Therapeutic Interventions: 1 to 1 sessions, Unit Group sessions and Medication administration.  Evaluation of Outcomes: Progressing   RN Treatment Plan for Primary Diagnosis: Bipolar affective disorder (HCC) Long Term Goal(s): Knowledge of disease and therapeutic regimen to maintain health will improve  Short Term Goals: Ability to remain free from injury will improve, Ability to verbalize frustration and anger appropriately will improve, Ability to demonstrate self-control, Ability to  participate in decision making will improve, Ability to verbalize feelings will improve, Ability to disclose and discuss suicidal ideas, Ability to identify and develop effective coping behaviors will improve, and Compliance with prescribed medications will improve  Medication Management: RN will administer medications as ordered by provider, will assess and evaluate patient's response and provide education to patient for prescribed medication. RN will report any adverse and/or side effects to prescribing provider.  Therapeutic Interventions: 1 on 1 counseling sessions, Psychoeducation, Medication administration, Evaluate responses to treatment, Monitor vital signs and CBGs as ordered, Perform/monitor CIWA, COWS, AIMS and Fall Risk screenings as ordered, Perform wound care treatments as ordered.  Evaluation of Outcomes: Progressing   LCSW Treatment Plan for Primary Diagnosis: Bipolar affective disorder (HCC) Long Term Goal(s): Safe transition to appropriate next level of care at discharge, Engage patient in therapeutic group addressing interpersonal concerns.  Short Term Goals: Engage patient in aftercare planning with referrals and resources, Increase social support, Increase ability to appropriately verbalize feelings, Increase emotional regulation, Facilitate acceptance of mental health diagnosis and concerns, Facilitate patient progression through stages of change regarding substance use diagnoses and concerns, Identify triggers associated with mental health/substance abuse issues, and Increase skills for wellness and recovery  Therapeutic Interventions: Assess for all discharge needs, 1 to 1 time with Social worker, Explore available resources and support systems, Assess for adequacy in community support network, Educate family and significant other(s) on suicide prevention, Complete Psychosocial Assessment, Interpersonal group therapy.  Evaluation of Outcomes: Progressing   Progress in  Treatment: Attending groups: No. Participating in groups: No. Taking medication as prescribed: Yes. Toleration medication: Yes. Family/Significant other contact made: Yes, individual(s) contacted:  Moise Friday [phone] (Mother) Patient understands diagnosis: Yes. Discussing patient identified problems/goals with staff: Yes. Medical problems stabilized or resolved: Yes. Denies suicidal/homicidal ideation: No. Issues/concerns per patient self-inventory: Yes. Other: none  New problem(s) identified: No, Describe:  none  New Short Term/Long Term Goal(s): Patient to work towards elimination of symptoms of psychosis, medication management for mood stabilization; elimination of SI thoughts; development of comprehensive mental wellness plan.  Patient Goals:  Patient states their goal for treatment is to "get out, get on new medication."  Discharge Plan or Barriers: No psychosocial barriers identified at this time, patient to return to place of residence when appropriate for discharge.   Reason for Continuation of Hospitalization: Other; describe psychosis   Estimated Length of Stay: 1-7 days   Last 3 [place] Suicide Severity Risk Score: Flowsheet Row [facility] (Current[facility]/2022 [facility] to Hosp-Admission (Discharged) from 04/16/2021 in [facility]STEPDOWN  C-SSRS RISK CATEGORY Low Risk High Risk High Risk       Last PHQ 2/9 Scores:     No data to display  Scribe for Treatment Team: Almedia Balls 02/07/2022 2:53 PM

## 2022-02-07 NOTE — [facility] Suicide Risk Assessment (Signed)
[facility] INPATIENT:  Family/Significant Other Suicide Prevention Education  Suicide Prevention Education:  Education Completed; Zaiyden Strozier [phone] (Mother) has been identified by the patient as the family member/significant other with whom the patient will be residing, and identified as the person(s) who will aid the patient in the event of a mental health crisis (suicidal ideations/suicide attempt).  With written consent from the patient, the family member/significant other has been provided the following suicide prevention education, prior to the and/or following the discharge of the patient.  The suicide prevention education provided includes the following: Suicide risk factors Suicide prevention and interventions National Suicide Hotline telephone number [facility] assessment telephone number [facility] Emergency Assistance 911 [facility] and/or Residential Mobile Crisis Unit telephone number  Request made of family/significant other to: Remove weapons (e.g., guns, rifles, knives), all items previously/currently identified as safety concern.   Remove drugs/medications (over-the-counter, prescriptions, illicit drugs), all items previously/currently identified as a safety concern.  The family member/significant other verbalizes understanding of the suicide prevention education information provided.  The family member/significant other agrees to remove the items of safety concern listed above.  CSW spoke with Mrs. Koenig who states that she went to check on her son and saw that his body was contorted and he was "out of it".  She states that her son's mood "goes between depression and mania until he has psychosis".  She states that he is not taking his medications consistently.  She states that her son lives on his own and she comes to the apartment daily to check on him and remind him to take his medications.  Mrs. Helmstetter states that her son is seen by [facility] but has not  been to any of his appointments in 3 weeks. She states that Vesta Mixer is also working with her son to help him with housing where he would have a roommate to help him with medications.  She states that she is also interested in having her son's discharge instructions and outpatient appointments emailed to her so she can help him with those items.  Mrs. Westrich states that her son has no weapons or firearms in his home.  CSW completed SPE with Mrs. Mancusi.   [employer] Kung Lennix Kneisel 02/07/2022, 2:40 PM

## 2022-02-07 NOTE — H&P (Addendum)
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 -Agitation protocol added for PO and IM (if refusing PO): ativan, haldol, and benadryl  -- Encouraged patient to participate in unit milieu and in scheduled group therapies   -- Short Term Goals: Ability to identify changes in lifestyle to reduce recurrence of condition will improve, Ability to verbalize feelings will improve, Ability to disclose and discuss suicidal ideas, and Ability to demonstrate self-control will improve  -- Long Term Goals: Improvement in symptoms so as ready for discharge  3. Medical Management Labs and  Imaging Patient's recent labs were reviewed and pertinent positives and negatives are listed below: CMP: wnl CBC: wnl EtOH:  Lab Results  Component Value Date   ETH <10 02/05/2022   ETH <10 04/16/2021   UDS: negative TSH: pending A1C: pending Lipids: pending  Medical Problems none  PRN The following PRN medications were added to ensure patient can focus on treatment. These were discussed with patient and patient aware of ability to ask for the following medications:  -Tylenol 650 mg q6hr PRN for mild pain -Mylanta 30 ml suspension for indigestion -Milk of Magnesia 30 ml for constipation -Trazodone 50 mg qhs for insomnia -Hydroxyzine 25 mg tid PRN for anxiety  4. Discharge Planning Patient will require the following based on my assessment:  Greatly appreciate CSW and Case management assistance with facilitating these needs and any further recommendations regarding patient's needs upon discharge. Estimated LOS: 7 days Discharge Concerns: Need to establish a safety plan; Medication compliance and effectiveness Discharge Goals: Return home with outpatient referrals for mental health follow-up including medication management/psychotherapy   Long Term Goal(s): Minimizing disruption current psychiatric diagnosis is causing so that patient can be safely discharged Short Term Goals: Compliance with proposed treatment plan and adjusting to psychiatric unit and peers.  I certify that inpatient services furnished can reasonably be expected to improve the patient's condition.     Park Pope, MD PGY2 Psychiatry Resident 9/8/20232:15 PM

## 2022-02-07 NOTE — [facility] Suicide Risk Assessment (Signed)
[facility] Admission Suicide Risk Assessment   Nursing information obtained from:  Patient Demographic factors:  Caucasian, Male, Adolescent or young adult, Living alone Current Mental Status:  Self-harm behaviors Loss Factors:  NA Historical Factors:  Impulsivity Risk Reduction Factors:  Positive social support  Total Time spent with patient: 45 minutes Principal Problem: Bipolar affective disorder ([place]) Diagnosis:  Principal Problem:   Bipolar affective disorder ([place])   Subjective Data: Billy Rose is a 21 y.o. male with psychiatric hx of schizoaffective disorder bipolar type, PTSD, generalized anxiety disorder who presented to [facility] under IVC due to concerns for suicide attempt as well as worsening psychosis for past 6 months.   Patient was seen and assessed in bedroom.  Patient initially had been sitting near her bed during assessment but 10 minutes later began pacing his room.  Patient reports that he does not know why he ended up in the hospital.  Patient reports that this was likely because he "gave too much blood".  When asked to clarify what this meant, he mentions that he has been donating plasma for money for his apartment rent.  Patient reports that he suspects he had been giving too much plasma and led to him ending up in the ED for IV fluid.  Patient provides an inconsistent history/recounting of events given he also has mentioned to other providers that he had possibly overdosed on Prozac/risperidone and has also stated to nursing that he is hearing voices.   Patient reports that he does have symptoms of depression including depressed mood, anhedonia, poor concentration, self-isolation, poor energy.  However, patient denies any problems with eating or sleeping besides when he is "manic".  Patient reports that he has been depressed since he was 23 or 6 with his father had "split".  He denies present SI/HI.  Patient does admit to having history of multiple suicide attempts stating that  these were in the setting of worsening depression and anxiety where he felt that "it was game over".   Patient endorses history of manic episodes where he describes auditory hallucinations in which he will hear voices from "hell and oblivion".  He states that during these periods, he would not sleep and behave have erratic behavior.  Patient states that he last had an episode a month ago but has had recurrent episodes of this for multiple years now.  Later during this assessment, patient started to pace from side to side and appear to have more thought blocking. He would salute randomly and make signs of the cross.  When asked why he does this, patient's reports that it is because he is [place] and this was a way to protect himself.  Patient reports that this behavior is in order to protect him from catastrophic events that may occur.  Patient then later began to respond to internal stimuli stating he was "just talking to myself".  Patient denies visual hallucinations, paranoia, ideas of reference.     Patient denies significant anxiety or panic attacks.  However, patient then recalls that he has had panic attacks and tremors related to his diagnosis of PTSD.  When asked what type of trauma history is that he does not specifically mention any besides when his father left the family when he was 78 or 6.   Patient currently lives alone in an apartment.  Patient states that he uses plasma donation to pay rent.  Patient endorses drinking 2 40 ounces of beer per week.  When asked specifically what type of beer, he  replies "Hyacinth Meeker" and then moments later mumbled "you do not have to tell him that, why did you say that?"  Patient reports occasional THC use which he states is once every few weeks.  Patient reports it is the "legal stuff" specifically mentioning delta 8.  Patient reports that this is for his depression and states that his alcohol use and marijuana use helps with his depression.   Patient reports he  is unclear why he was involuntarily committed stating that he "just woke up and found out".  Clarified with patient why he is in the behavioral health hospital and he states that it was because he "gave too much blood and I usually come here after I go to the hospital" referring to the multiple suicide attempts he has had in the past as well as psychosis.    Continued Clinical Symptoms:  Alcohol Use Disorder Identification Test Final Score (AUDIT): 2 The "Alcohol Use Disorders Identification Test", Guidelines for Use in Primary Care, Second Edition.  World Science writer [facility]). Score between 0-7:  no or low risk or alcohol related problems. Score between 8-15:  moderate risk of alcohol related problems. Score between 16-19:  high risk of alcohol related problems. Score 20 or above:  warrants further diagnostic evaluation for alcohol dependence and treatment.   CLINICAL FACTORS:   Severe Anxiety and/or Agitation More than one psychiatric diagnosis Currently Psychotic Previous Psychiatric Diagnoses and Treatments   Musculoskeletal: Strength & Muscle Tone: within normal limits Gait & Station: normal Patient leans: N/A  Psychiatric Specialty Exam:  Presentation  General Appearance: Appropriate for Environment; Casual   Eye Contact:Minimal; None   Speech:Clear and Coherent; Normal Rate   Speech Volume:Decreased   Handedness:Right   Mood and Affect  Mood:Depressed   Affect:Flat    Thought Process  Thought Processes:Disorganized   Descriptions of Associations:Intact   Orientation:Full (Time, Place and Person)   Thought Content:Logical   History of Schizophrenia/Schizoaffective disorder:No data recorded  Duration of Psychotic Symptoms:No data recorded  Hallucinations:Hallucinations: None   Ideas of Reference:None   Suicidal Thoughts:Suicidal Thoughts: No   Homicidal Thoughts:Homicidal Thoughts: No    Sensorium  Memory:Immediate Fair;  Recent Fair; Remote Fair   Judgment:Impaired   Insight:Lacking    Executive Functions  Concentration:Fair   Attention Span:Fair   Recall:Fair   Fund of Knowledge:Fair   Language:Fair    Psychomotor Activity  Psychomotor Activity:Psychomotor Activity: Normal    Assets  Assets:Communication Skills; Desire for Improvement; Financial Resources/Insurance; Housing; Physical Health    Sleep  Sleep:Sleep: Poor Number of Hours of Sleep: 2.5     Physical Exam: Physical Exam Review of Systems  Respiratory:  Negative for shortness of breath.   Cardiovascular:  Negative for chest pain.  Gastrointestinal:  Negative for abdominal pain, constipation, diarrhea, heartburn, nausea and vomiting.  Neurological:  Negative for headaches.   Blood pressure 99/65, pulse 89, temperature 98.3 F (36.8 C), temperature source Oral, resp. rate 14, height 5\' 11"  (1.803 m), weight 59 kg, SpO2 97 %. Body mass index is 18.13 kg/m.   COGNITIVE FEATURES THAT CONTRIBUTE TO RISK:  None    SUICIDE RISK:   Moderate:  Frequent suicidal ideation with limited intensity, and duration, some specificity in terms of plans, no associated intent, good self-control, limited dysphoria/symptomatology, some risk factors present, and identifiable protective factors, including available and accessible social support.  PLAN OF CARE: see H&P  I certify that inpatient services furnished can reasonably be expected to improve the patient's condition.    Hazle Quant, MD 02/07/2022, 2:30 PM

## 2022-02-07 NOTE — Progress Notes (Signed)
   02/07/22 0500  Sleep  Number of Hours 2.5

## 2022-02-07 NOTE — Progress Notes (Signed)
Dar Note: Patient presents with anxious mood and affect.  Denies suicidal thoughts, auditory and visual hallucinations.  Reports energy level as hyper with good concentration.  Medications given as prescribed.  Routine safety checks maintained.  Patient observed pacing the hallway.  Did not attend group when invited.  Minimal interaction with staff.  Patient is safe on and off the unit.

## 2022-02-07 NOTE — ED Notes (Signed)
Report called to [facility] and [facility] called to transport pt. [employer] has arrived and received IVC papers, pt belongings, and pt

## 2022-02-07 NOTE — Group Note (Signed)
Recreation Therapy Group Note   Group Topic:Leisure Education  Group Date: 02/07/2022 Start Time: 1000 End Time: 1030 Facilitators: Caroll Rancher, LRT,CTRS Location: [address] Dayroom   Goal Area(s) Addresses:  Patient will effectively work with peer towards shared goal.  Patient will identify skills used to make activity successful.  Patient will identify how skills used during activity can be used to reach post d/c goals.   Group Description: Straw Bridge. In teams of 3-5, patients were given 15 plastic drinking straws and an equal length of masking tape. Using the materials provided, patients were instructed to build a free standing bridge-like structure to suspend an everyday item (ex: puzzle box) off of the floor or table surface. All materials were required to be used by the team in their design. LRT facilitated post-activity discussion reviewing team process. Patients were encouraged to reflect how the skills used in this activity can be generalized to daily life post discharge.    Affect/Mood: N/A   Participation Level: Did not attend    Clinical Observations/Individualized Feedback:     Plan: Continue to engage patient in RT group sessions 2-3x/week.   Caroll Rancher, LRT,CTRS 02/07/2022 12:05 PM

## 2022-02-07 NOTE — Plan of Care (Signed)
  Problem: Education: Goal: Emotional status will improve Outcome: Not Progressing Goal: Mental status will improve Outcome: Not Progressing Goal: Verbalization of understanding the information provided will improve Outcome: Not Progressing   

## 2022-02-07 NOTE — Group Note (Signed)
[facility] LCSW Group Therapy Note   Group Date: 02/07/2022 Start Time: 1115 End Time: 1215   Type of Therapy and Topic: Group Therapy: Avoiding Self-Sabotaging and Enabling Behaviors  Participation Level: Did Not Attend  Mood:  Description of Group:  In this group, patients will learn how to identify obstacles, self-sabotaging and enabling behaviors, as well as: what are they, why do we do them and what needs these behaviors meet. Discuss unhealthy relationships and how to have positive healthy boundaries with those that sabotage and enable. Explore aspects of self-sabotage and enabling in yourself and how to limit these self-destructive behaviors in everyday life.   Therapeutic Goals: 1. Patient will identify one obstacle that relates to self-sabotage and enabling behaviors 2. Patient will identify one personal self-sabotaging or enabling behavior they did prior to admission 3. Patient will state a plan to change the above identified behavior 4. Patient will demonstrate ability to communicate their needs through discussion and/or role play.    Summary of Patient Progress: Patient did not attend group despite encouraged participation.    Therapeutic Modalities:  Cognitive Behavioral Therapy Person-Centered Therapy Motivational Interviewing    Franko Hilliker W Emalia Witkop, LCSWA 

## 2022-02-08 DIAGNOSIS — F250 Schizoaffective disorder, bipolar type: Principal | ICD-10-CM

## 2022-02-08 DIAGNOSIS — F3164 Bipolar disorder, current episode mixed, severe, with psychotic features: Secondary | ICD-10-CM | POA: Diagnosis not present

## 2022-02-08 LAB — HEMOGLOBIN A1C
Hgb A1c MFr Bld: 5.1 % (ref 4.8–5.6)
Mean Plasma Glucose: 99.67 mg/dL

## 2022-02-08 NOTE — Progress Notes (Signed)
   02/08/22 0500  Sleep  Number of Hours 7.5
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
## 2022-02-08 NOTE — Group Note (Signed)
[facility] LCSW Group Therapy Note  Date/Time:    02/08/2022 10:00-11:00AM  Type of Therapy and Topic:  Group Therapy:  Shame and its Impact on My Life  Participation Level:  Did Not Attend   Description of Group:  The focus of this group was to examine our tendency to be hyper-critical of self and how this leads to feelings of worthlessness, hopelessness, and shame.  Patients were guided to the concept that shame is universal and is worsened by being kept hidden, but improved by being revealed.  We discussed how feeling unworthy is the result of shame and discussed the differences between guilt  ("I did something bad" "I made a mistake" "I did something stupid") and shame ("I am bad" "I am a mistake" "I am stupid") .  We discussed that feelings are not necessarily based in facts.  We also talked about what happens when we do something to numb our negative feelings and how that actually numbs our positive feelings at the same time.  Therapeutic Goals Identify statements patients automatically say to themselves, "I'll be worthy when...."  Examine how this is unkind to ourselves because it indicates we cannot be worthy until some far-reaching, possibly even unreachable, goal is achieved. Talk about the frequent use of unhealthy coping skills used when feeling unworthy Allow patients to discuss their shame out loud in order to reduce its power over them  Summary of Patient Progress:  Patient was invited to group, did not attend.   Therapeutic Modalities Processing  Ambrose Mantle, LCSW 02/08/2022, 3:04 PM

## 2022-02-08 NOTE — Progress Notes (Signed)
Pt is A&OX4, calm, flat, denies suicidal ideations, denies homicidal ideations, denies auditory hallucinations and denies visual hallucinations. Pt verbally agrees to approach staff if these become apparent and before harming self or others. Pt denies experiencing nightmares. Mood and affect are congruent. Pt appetite is ok. No complaints of anxiety, distress, pain and/or discomfort at this time. Pt's memory appears to be grossly intact, and Pt hasn't displayed any injurious behaviors. Pt is medication compliant. There's no evidence of suicidal intent. Psychomotor activity was WNL. No s/s of Parkinson, Dystonia, Akathisia and/or Tardive Dyskinesia noted.   

## 2022-02-08 NOTE — [facility] Group Notes (Signed)
Goals Group 02/08/2022   Group Focus: affirmation, clarity of thought, and goals/reality orientation Treatment Modality:  Psychoeducation Interventions utilized were assignment, group exercise, and support Purpose: To be able to understand and verbalize the reason for their admission to the hospital. To understand that the medication helps with their chemical imbalance but they also need to work on their choices in life. To be challenged to develop Rose list of 30 positives about themselves. Also introduce the concept that "feelings" are not reality.  Participation Level:  did not attend  Billy Rose 

## 2022-02-09 NOTE — Group Note (Signed)
LCSW Group Therapy Note  02/09/2022      Type of Therapy and Topic:  Group Therapy: Gratitude  Participation Level:  Did Not Attend   Description of Group:   In this group, patients shared and discussed the importance of acknowledging the elements in their lives for which they are grateful and how this can positively impact their mood.  The group discussed how bringing the positive elements of their lives to the forefront of their minds can help with recovery from any illness, physical or mental.  An exercise was done as a group in which a list was made of gratitude items in order to encourage participants to consider other potential positives in their lives.  Therapeutic Goals: Patients will identify one or more item for which they are grateful in each of 6 categories:  people, experience, thing, place, skill, and other. Patients will discuss how it is possible to seek out gratitude in even bad situations. Patients will explore other possible items of gratitude that they could remember.   Summary of Patient Progress:  The patient did not attend this group. Therapeutic Modalities:   Solution-Focused Therapy Activity  Fredis Malkiewicz [place], LCSWA 10:34 AM

## 2022-02-09 NOTE — Plan of Care (Signed)
Nurse discussed anxiety, depression and coping skills with patient.  

## 2022-02-09 NOTE — Progress Notes (Addendum)
Billy Rose Progress Note  02/09/2022 3:40 PM Billy Rose  MRN:  [id number] Subjective:   Billy Rose is a 21 y.o. male with psychiatric hx of schizoaffective disorder bipolar type, PTSD, generalized anxiety disorder who presented to [facility] under IVC due to concerns for suicide attempt as well as worsening psychosis for past 6 months.  On assessment today, the patient exhibits improvement.  His thought process is linear and logical.  He is able to state reasonable goals for the future and is able to answer commonsense questions.  He is not delusional.  Of note, the patient frequently asked about discharge.  He is amenable to getting an LAI if it will expedite his discharge.  He denies experiencing medication side effects.  He reports good sleep and appetite.  He denies experiencing auditory or visual hallucinations.  He denies obstructive symptoms.  He denies suicidal homicidal thoughts.   Mother not contacted, will defer to weekday provider, Dr. Hazle Quant.    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 No pertinent surgical history. Family History:  Family History  Problem Relation Age of Onset   Allergic rhinitis Neg Hx    Asthma Neg Hx    Eczema Neg Hx    Urticaria Neg Hx    Family Psychiatric  History: per H and P Social History:  Social History   Substance and Sexual Activity  Alcohol Use Yes   Comment: "4 beers every so often"     Social History   Substance and Sexual Activity  Drug Use Not  Currently   Types: Cocaine, LSD, Marijuana, IV    Social History   Socioeconomic History   Marital status: Single    Spouse name: Not on file   Number of children: Not on file   Years of education: Not on file   Highest education level: Not on file  Occupational History   Not on file  Tobacco Use   Smoking status: Every Day    Packs/day: 1.00    Types: Cigarettes   Smokeless tobacco: Never  Vaping Use   Vaping Use: Never used  Substance and Sexual Activity   Alcohol use: Yes    Comment: "4 beers every so often"   Drug use: Not Currently    Types: Cocaine, LSD, Marijuana, IV   Sexual activity: Not Currently  Other Topics Concern   Not on file  Social History Narrative   ** Merged History Encounter **       Social Determinants of Health   Financial Resource Strain: Not on file  Food Insecurity: Unknown (02/07/2022)   Hunger Vital Sign    Worried About Running Out of Food in the Last Year: Patient refused    Ran Out of Food in the Last Year: Patient refused  Transportation Needs: Unknown (02/07/2022)   PRAPARE - Administrator, Civil Service (Medical): Not on file    Lack of Transportation (Non-Medical): Patient refused  Physical Activity: Not on file  Stress: Not on file  Social Connections: Not on file  Additional Social History:                         Sleep: Fair  Appetite:  Fair  Current Medications: Current Facility-Administered Medications  Medication Dose Route Frequency Provider Last Rate Last Admin   acetaminophen (TYLENOL) tablet 650 mg  650 mg Oral Q6H PRN Jackelyn Poling, NP       alum & mag hydroxide-simeth (MAALOX/MYLANTA) 200-200-20 MG/5ML suspension 30 mL  30 mL Oral Q4H PRN Nira Conn A, NP       benztropine (COGENTIN) tablet 1 mg  1 mg Oral BID PRN Park Pope, Rose       haloperidol (HALDOL) tablet 5 mg  5 mg Oral Q8H PRN Park Pope, Rose       And   LORazepam (ATIVAN) tablet 2 mg  2 mg Oral Q8H PRN Park Pope, Rose       And    diphenhydrAMINE (BENADRYL) capsule 25 mg  25 mg Oral Q8H PRN Park Pope, Rose       haloperidol lactate (HALDOL) injection 5 mg  5 mg Intramuscular Q6H PRN Park Pope, Rose       And   LORazepam (ATIVAN) injection 2 mg  2 mg Intramuscular Q6H PRN Park Pope, Rose       And   diphenhydrAMINE (BENADRYL) injection 50 mg  50 mg Intravenous Q6H PRN Park Pope, Rose       feeding supplement (ENSURE ENLIVE / ENSURE PLUS) liquid 237 mL  237 mL Oral BID BM Nkwenti, Doris, NP   237 mL at 02/09/22 1004   FLUoxetine (PROZAC) capsule 10 mg  10 mg Oral Daily Park Pope, Rose   10 mg at 02/09/22 0753   haloperidol (HALDOL) tablet 5 mg  5 mg Oral BID Park Pope, Rose   5 mg at 02/09/22 1740   hydrOXYzine (ATARAX) tablet 25 mg  25 mg Oral TID PRN Park Pope, Rose   25 mg at 02/07/22 2037   magnesium hydroxide (MILK OF MAGNESIA) suspension 30 mL  30 mL Oral Daily PRN Jackelyn Poling, NP       nicotine (NICODERM CQ - dosed in mg/24 hours) patch 14 mg  14 mg Transdermal Daily Sindy Guadeloupe, NP       traZODone (DESYREL) tablet 50 mg  50 mg Oral QHS PRN Jackelyn Poling, NP   50 mg at 02/07/22 2037    Lab Results:  Results for orders placed or performed during the hospital encounter of 02/07/22 (from the past 48 hour(s))  Hemoglobin A1c     Status: None   Collection Time: 02/07/22  6:28 PM  Result Value Ref Range   Hgb A1c MFr Bld 5.1 4.8 - 5.6 %    Comment: (NOTE) Pre diabetes:          5.7%-6.4%  Diabetes:              >6.4%  Glycemic control for   <7.0% adults with diabetes    Mean Plasma Glucose 99.67 mg/dL    Comment: Performed at [facility], [address], [place], [place] [address]  Lipid panel     Status: None   Collection Time: 02/07/22  6:28 PM  Result Value Ref Range   Cholesterol 128 0 - 200 mg/dL   Triglycerides 81 <185 mg/dL   HDL 51 >63 mg/dL   Total CHOL/HDL Ratio 2.5 RATIO   VLDL 16 0 - 40 mg/dL  LDL Cholesterol 61 0 - 99 mg/dL    Comment:        Total Cholesterol/HDL:CHD  Risk Coronary Heart Disease Risk Table                     Men   Women  1/2 Average Risk   3.4   3.3  Average Risk       5.0   4.4  2 X Average Risk   9.6   7.1  3 X Average Risk  23.4   11.0        Use the calculated Patient Ratio above and the CHD Risk Table to determine the patient's CHD Risk.        ATP III CLASSIFICATION (LDL):  <100     mg/dL   Optimal  161-096  mg/dL   Near or Above                    Optimal  130-159  mg/dL   Borderline  045-409  mg/dL   High  >811     mg/dL   Very High Performed at [facility], [address] [place], [place] [address]   TSH     Status: None   Collection Time: 02/07/22  6:28 PM  Result Value Ref Range   TSH 1.168 0.350 - 4.500 uIU/mL    Comment: Performed by a 3rd Generation assay with a functional sensitivity of <=0.01 uIU/mL. Performed at [facility], [address], [place], [place] [address]     Blood Alcohol level:  Lab Results  Component Value Date   ETH <10 02/05/2022   ETH <10 04/16/2021    Metabolic Disorder Labs: Lab Results  Component Value Date   HGBA1C 5.1 02/07/2022   MPG 99.67 02/07/2022   MPG 102.54 10/10/2020   Lab Results  Component Value Date   PROLACTIN 24.1 (H) 05/22/2020   PROLACTIN 32.1 (H) 02/15/2017   Lab Results  Component Value Date   CHOL 128 02/07/2022   TRIG 81 02/07/2022   HDL 51 02/07/2022   CHOLHDL 2.5 02/07/2022   VLDL 16 02/07/2022   LDLCALC 61 02/07/2022   LDLCALC 52 10/10/2020    Physical Findings:   Musculoskeletal: Strength & Muscle Tone: within normal limits Gait & Station: normal Patient leans: N/A  Psychiatric Specialty Exam:  Presentation  General Appearance: Appropriate for Environment; Casual  Eye Contact:Minimal; None  Speech:Clear and Coherent; Normal Rate  Speech Volume:Decreased  Handedness:Right   Mood and Affect  Mood:Depressed  Affect: constricted  Thought Process  Thought  Processes:Disorganized  Descriptions of Associations:Intact  Orientation:Full (Time, Place and Person)  Thought Content:Logical  History of Schizophrenia/Schizoaffective disorder:No data recorded Duration of Psychotic Symptoms:No data recorded Hallucinations:No data recorded  Ideas of Reference:None  Suicidal Thoughts:No data recorded  Homicidal Thoughts:No data recorded   Sensorium  Memory:Immediate Fair; Recent Fair; Remote Fair  Judgment:Impaired  Insight:Lacking   Executive Functions  Concentration:Fair  Attention Span:Fair  Recall:Fair  Fund of Knowledge:Fair  Language:Fair   Psychomotor Activity  Psychomotor Activity:No data recorded   Assets  Assets:Communication Skills; Desire for Improvement; Financial Resources/Insurance; Housing; Physical Health   Sleep  Sleep:No data recorded    Physical Exam: Physical Exam Constitutional:      Appearance: the patient is not toxic-appearing.  Pulmonary:     Effort: Pulmonary effort is normal.  Neurological:     General: No focal deficit present.     Mental Status: the  patient is alert and oriented to person, place, and time.   Review of Systems  Respiratory:  Negative for shortness of breath.   Cardiovascular:  Negative for chest pain.  Gastrointestinal:  Negative for abdominal pain, constipation, diarrhea, nausea and vomiting.  Neurological:  Negative for headaches.   Blood pressure 108/65, pulse 97, temperature 97.9 F (36.6 C), temperature source Oral, resp. rate 16, height 5\' 11"  (1.803 m), weight 59 kg, SpO2 98 %. Body mass index is 18.13 kg/m.   Treatment Plan Summary: Daily contact with patient to assess and evaluate symptoms and progress in treatment and Medication management  Treatment Plan Summary: Billy Rose is a 21 y.o. male with psychiatric hx of schizoaffective disorder bipolar type, PTSD, generalized anxiety disorder who [facility] under [facility]C due to concerns for suicide  attempt as well as worsening psychosis for past 6 months.   Based on patient's presentation and history from mother as collateral, patient appears to have had ongoing psychosis for multiple months and has had multiple episodes of these worsening psychotic episodes in the context of medication noncompliance or refractory psychosis.  There is strong concern about medication compliance after the fact so we will work towards providing long-acting injectable prior to discharge and following up with outpatient provider.  Patient would definitely benefit from a mood stabilizer such as lithium or Depakote but given patient's risk for medication noncompliance, there is a need to ensure that he would be willing to follow-up.  Should patient be able to get more support outpatient, Clozaril would be a strong consideration as mother reports that patient's cousin seems to be doing really well from Clozaril but mother was hesitant about starting Clozaril given he would need weekly lab draws which she does not think he is willing to do.  Patient could also possibly benefit from ECT should he continue to have refractory psychosis despite multiple antipsychotic adjustments. Patient would benefit from ACT team given his multiple psychiatric hospitalizations, medication noncompliance, and refractory nature of patient's psychosis     Safety and Monitoring: INVOLUTARILY (safety) admission to inpatient psychiatric unit for safety, stabilization and treatment Daily contact with patient to assess and evaluate symptoms and progress in treatment Appropriate medication management to further stabilize patient Patient's case will be regularly discussed in multi-disciplinary team meeting Observation Level : q15 minute checks Vital signs: q12 hours Precautions: suicide, elopement, and assault   2. Psychiatric Problems Schizoaffective Disorder, Bipolar Type  Medication Noncompliance -Discontinue Risperdal -Continue Haloperidol 5  mg BID for psychosis --The risks/benefits/side-effects/alternatives to this medication were discussed in detail with the patient and time was given for questions. The patient consents to medication trial.  --Will encourage LAI in coming days             -- Metabolic profile and EKG monitoring obtained while on an atypical antipsychotic (BMI: 18.13 Lipid Panel: WNL HbgA1c: WNL QTc: 449)  -Cogentin 1 mg bid prn for EPS -Continue Prozac 10 mg daily for depression, consider increase tomorrow -Agitation protocol added for PO and IM (if refusing PO): ativan, haldol, and benadryl             -- Encouraged patient to participate in unit milieu and in scheduled group therapies              -- Short Term Goals: Ability to identify changes in lifestyle to reduce recurrence of condition will improve, Ability to verbalize feelings will improve, Ability to disclose and discuss suicidal ideas, and Ability to demonstrate self-control  will improve             -- Long Term Goals: Improvement in symptoms so as ready for discharge   3. Medical Management Labs and Imaging Patient's recent labs were reviewed and pertinent positives and negatives are listed below: CMP: wnl CBC: wnl EtOH:  Recent Labs       Lab Results  Component Value Date    ETH <10 02/05/2022    ETH <10 04/16/2021      UDS: negative TSH: nml A1C: nml Lipids: nml   Medical Problems none   PRN The following PRN medications were added to ensure patient can focus on treatment. These were discussed with patient and patient aware of ability to ask for the following medications:  -Tylenol 650 mg q6hr PRN for mild pain -Mylanta 30 ml suspension for indigestion -Milk of Magnesia 30 ml for constipation -Trazodone 50 mg qhs for insomnia -Hydroxyzine 25 mg tid PRN for anxiety   4. Discharge Planning Patient will require the following based on my assessment:  Greatly appreciate CSW and Case management assistance with facilitating these  needs and any further recommendations regarding patient's needs upon discharge. Estimated LOS: 7 days Discharge Concerns: Need to establish a safety plan; Medication compliance and effectiveness Discharge Goals: Return home with outpatient referrals for mental health follow-up including medication management/psychotherapy     Long Term Goal(s): Minimizing disruption current psychiatric diagnosis is causing so that patient can be safely discharged Short Term Goals: Compliance with proposed treatment plan and adjusting to psychiatric unit and peers.    Carlyn Reichert, Rose 02/09/2022, 3:40 PM

## 2022-02-09 NOTE — Progress Notes (Addendum)
D:  Patient's self inventory sheet, patient has fair sleep, no sleep medication.  Good appetite, normal energy, good concentration.  Denied depression, hopeless and  anxiety.  Denied withdrawals.  No SI.  Denied physical pain.  Denied physical problems.  Goal is to discharge home    Plans to wait.  Does have discharge plans. A:  Emotional support and encouragement  given patient.  Medications administered per MD orders.   R:  Denied SI and HI, contracts for safety.  Denied A/V hallucinations.  Safety maintained with 15 minute checks. :

## 2022-02-09 NOTE — [facility] Group Notes (Signed)
Adult Psychoeducational Group Note  Date:  02/09/2022 Time:  10:33 AM  Group Topic/Focus:  Goals Group:   The focus of this group is to help patients establish daily goals to achieve during treatment and discuss how the patient can incorporate goal setting into their daily lives to aide in recovery. Orientation:   The focus of this group is to educate the patient on the purpose and policies of crisis stabilization and provide a format to answer questions about their admission.  The group details unit policies and expectations of patients while admitted.  Participation Level:  Active  Participation Quality:  Appropriate  Affect:  Appropriate  Cognitive:  Alert  Insight: Appropriate  Engagement in Group:  Engaged  Modes of Intervention:  Discussion  Additional Comments:  Patient attended and participated in the goals.  Jearl Klinefelter 02/09/2022, 10:33 AM

## 2022-02-10 MED ORDER — HALOPERIDOL 5 MG PO TABS
10.0000 mg | ORAL_TABLET | Freq: Two times a day (BID) | ORAL | Status: DC
  Administered 2022-02-10 – 2022-02-14 (×8): 10 mg via ORAL
  Filled 2022-02-10 (×12): qty 2

## 2022-02-10 NOTE — Progress Notes (Signed)
[facility] MD Progress Note  02/10/2022 7:29 AM Billy Rose  MRN:  [id number] Subjective:   Billy Rose is a 21 y.o. male with psychiatric hx of schizoaffective disorder bipolar type, PTSD, generalized anxiety disorder who presented to [facility] under IVC due to concerns for suicide attempt as well as worsening psychosis for past 6 months.  On assessment today, the patient continues to appear guarded but his thought process is linear and logical.  Appears less future oriented than yesterday stating he wanted to leave to drink alcohol and smoke.  He is not delusional. He is amenable to getting an LAI if it will expedite his discharge.  He denies experiencing medication side effects.  He reports good sleep and appetite.  He denies experiencing auditory or visual hallucinations.  He denies obstructive symptoms.  He denies suicidal homicidal thoughts.   Patient occasionally appears internally preoccupied and guarded during assessment. Patient would randomly lift hands to his sides and bring them down for unknown reason. Per nursing report, patient is occasionally seen pacing the hall responding to internal stimuli. When asked about his last AH, he endorsed last week despite reporting to me it was a month ago so seems inconsistent historian depending on day.    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 No pertinent surgical history. Family History:  Family History  Problem Relation Age of Onset   Allergic rhinitis Neg  Hx    Asthma Neg Hx    Eczema Neg Hx    Urticaria Neg Hx    Family Psychiatric  History: per H and P Social History:  Social History   Substance and Sexual Activity  Alcohol Use Yes   Comment: "4 beers every so often"     Social History   Substance and Sexual Activity  Drug Use Not Currently   Types: Cocaine, LSD, Marijuana, IV    Social History   Socioeconomic History   Marital status: Single    Spouse name: Not on file   Number of children: Not on file   Years of education: Not on file   Highest education level: Not on file  Occupational History   Not on file  Tobacco Use   Smoking status: Every Day    Packs/day: 1.00    Types: Cigarettes   Smokeless tobacco: Never  Vaping Use   Vaping Use: Never used  Substance and Sexual Activity   Alcohol use: Yes    Comment: "4 beers every so often"   Drug use: Not Currently    Types: Cocaine, LSD, Marijuana, IV   Sexual activity: Not Currently  Other Topics Concern   Not on file  Social History Narrative   ** Merged History Encounter **       Social Determinants of Health   Financial Resource Strain: Not on file  Food Insecurity: Unknown (02/07/2022)   Hunger Vital Sign    Worried About Running Out of Food in the Last Year: Patient refused    Ran Out of Food in the Last Year: Patient refused  Transportation Needs: Unknown (02/07/2022)   PRAPARE - Administrator, Civil Service (Medical): Not on file    Lack of Transportation (Non-Medical): Patient refused  Physical Activity: Not on file  Stress: Not on file  Social Connections: Not on file   Additional Social History:                         Sleep: Fair  Appetite:  Fair  Current Medications: Current Facility-Administered Medications  Medication Dose Route Frequency Provider Last Rate Last Admin   acetaminophen (TYLENOL) tablet 650 mg  650 mg Oral Q6H PRN Jackelyn Poling, NP       alum & mag hydroxide-simeth (MAALOX/MYLANTA) 200-200-20  MG/5ML suspension 30 mL  30 mL Oral Q4H PRN Jackelyn Poling, NP       benztropine (COGENTIN) tablet 1 mg  1 mg Oral BID PRN Park Pope, MD       haloperidol (HALDOL) tablet 5 mg  5 mg Oral Q8H PRN Park Pope, MD       And   LORazepam (ATIVAN) tablet 2 mg  2 mg Oral Q8H PRN Park Pope, MD       And   diphenhydrAMINE (BENADRYL) capsule 25 mg  25 mg Oral Q8H PRN Park Pope, MD       haloperidol lactate (HALDOL) injection 5 mg  5 mg Intramuscular Q6H PRN Park Pope, MD       And   LORazepam (ATIVAN) injection 2 mg  2 mg Intramuscular Q6H PRN Park Pope, MD       And   diphenhydrAMINE (BENADRYL) injection 50 mg  50 mg Intravenous Q6H PRN Park Pope, MD       feeding supplement (ENSURE ENLIVE / ENSURE PLUS) liquid 237 mL  237 mL Oral BID BM Nkwenti, Doris, NP   237 mL at 02/09/22 1500   FLUoxetine (PROZAC) capsule 10 mg  10 mg Oral Daily Park Pope, MD   10 mg at 02/09/22 0753   haloperidol (HALDOL) tablet 5 mg  5 mg Oral BID Park Pope, MD   5 mg at 02/09/22 1709   hydrOXYzine (ATARAX) tablet 25 mg  25 mg Oral TID PRN Park Pope, MD   25 mg at 02/07/22 2037   magnesium hydroxide (MILK OF MAGNESIA) suspension 30 mL  30 mL Oral Daily PRN Jackelyn Poling, NP       nicotine (NICODERM CQ - dosed in mg/24 hours) patch 14 mg  14 mg Transdermal Daily Sindy Guadeloupe, NP       traZODone (DESYREL) tablet 50 mg  50 mg Oral QHS PRN Nira Conn A, NP   50 mg at 02/07/22 2037    Lab Results:  No results found for this or any previous visit (from the past 48 hour(s)).   Blood Alcohol level:  Lab Results  Component Value Date   ETH <10 02/05/2022   ETH <10 04/16/2021    Metabolic Disorder Labs: Lab Results  Component Value Date   HGBA1C 5.1 02/07/2022   MPG 99.67 02/07/2022   MPG 102.54 10/10/2020   Lab Results  Component Value Date   PROLACTIN 24.1 (H) 05/22/2020   PROLACTIN 32.1 (H) 02/15/2017   Lab Results  Component Value Date   CHOL 128 02/07/2022   TRIG 81 02/07/2022   HDL 51 02/07/2022    CHOLHDL 2.5 02/07/2022   VLDL 16 02/07/2022   LDLCALC 61 02/07/2022   LDLCALC 52 10/10/2020  Physical Findings:   Musculoskeletal: Strength & Muscle Tone: within normal limits Gait & Station: normal Patient leans: N/A  Psychiatric Specialty Exam:  Presentation  General Appearance: Appropriate for Environment; Casual  Eye Contact:Minimal; None  Speech:Clear and Coherent; Normal Rate  Speech Volume:Decreased  Handedness:Right   Mood and Affect  Mood:Depressed  Affect: constricted  Thought Process  Thought Processes:Disorganized  Descriptions of Associations:Intact  Orientation:Full (Time, Place and Person)  Thought Content:Logical  History of Schizophrenia/Schizoaffective disorder:No data recorded Duration of Psychotic Symptoms:No data recorded Hallucinations:No data recorded  Ideas of Reference:None  Suicidal Thoughts:No data recorded  Homicidal Thoughts:No data recorded   Sensorium  Memory:Immediate Fair; Recent Fair; Remote Fair  Judgment:Impaired  Insight:Lacking   Executive Functions  Concentration:Fair  Attention Span:Fair  Recall:Fair  Fund of Knowledge:Fair  Language:Fair   Psychomotor Activity  Psychomotor Activity:No data recorded   Assets  Assets:Communication Skills; Desire for Improvement; Financial Resources/Insurance; Housing; Physical Health   Sleep  Sleep:No data recorded    Physical Exam: Physical Exam Constitutional:      Appearance: the patient is not toxic-appearing.  Pulmonary:     Effort: Pulmonary effort is normal.  Neurological:     General: No focal deficit present.     Mental Status: the patient is alert and oriented to person, place, and time.   Review of Systems  Respiratory:  Negative for shortness of breath.   Cardiovascular:  Negative for chest pain.  Gastrointestinal:  Negative for abdominal pain, constipation, diarrhea, nausea and vomiting.  Neurological:  Negative for headaches.    Blood pressure 101/63, pulse (!) 114, temperature 98 F (36.7 C), temperature source Oral, resp. rate 16, height 5\' 11"  (1.803 m), weight 59 kg, SpO2 97 %. Body mass index is 18.13 kg/m.   Treatment Plan Summary: Daily contact with patient to assess and evaluate symptoms and progress in treatment and Medication management  Treatment Plan Summary: Neilson Oehlert is a 21 y.o. male with psychiatric hx of schizoaffective disorder bipolar type, PTSD, generalized anxiety disorder who presented to [facility] under IVC due to concerns for suicide attempt as well as worsening psychosis for past 6 months.   Safety and Monitoring: INVOLUTARILY (safety) admission to inpatient psychiatric unit for safety, stabilization and treatment Daily contact with patient to assess and evaluate symptoms and progress in treatment Appropriate medication management to further stabilize patient Patient's case will be regularly discussed in multi-disciplinary team meeting Observation Level : q15 minute checks Vital signs: q12 hours Precautions: suicide, elopement, and assault   2. Psychiatric Problems Schizoaffective Disorder, Bipolar Type  Medication Noncompliance -Discontinue Risperdal -Increase Haloperidol to 10 mg BID for psychosis --The risks/benefits/side-effects/alternatives to this medication were discussed in detail with the patient and time was given for questions. The patient consents to medication trial.  --Amenable to LAI             -- Metabolic profile and EKG monitoring obtained while on an atypical antipsychotic (BMI: 18.13 Lipid Panel: WNL HbgA1c: WNL QTc: 449)  -Cogentin 1 mg bid prn for EPS -Continue Prozac 10 mg for depression daily -Agitation protocol added for PO and IM (if refusing PO): ativan, haldol, and benadryl             -- Encouraged patient to participate in unit milieu and in scheduled group therapies              -- Short Term Goals: Ability to identify changes in lifestyle to reduce  recurrence of condition will improve, Ability to verbalize feelings  will improve, Ability to disclose and discuss suicidal ideas, and Ability to demonstrate self-control will improve             -- Long Term Goals: Improvement in symptoms so as ready for discharge   3. Medical Management Labs and Imaging Patient's recent labs were reviewed and pertinent positives and negatives are listed below: CMP: wnl CBC: wnl EtOH:  Recent Labs       Lab Results  Component Value Date    ETH <10 02/05/2022    ETH <10 04/16/2021      UDS: negative TSH: nml A1C: nml Lipids: nml   Medical Problems none   PRN The following PRN medications were added to ensure patient can focus on treatment. These were discussed with patient and patient aware of ability to ask for the following medications:  -Tylenol 650 mg q6hr PRN for mild pain -Mylanta 30 ml suspension for indigestion -Milk of Magnesia 30 ml for constipation -Trazodone 50 mg qhs for insomnia -Hydroxyzine 25 mg tid PRN for anxiety   4. Discharge Planning Patient will require the following based on my assessment:  Greatly appreciate CSW and Case management assistance with facilitating these needs and any further recommendations regarding patient's needs upon discharge. Estimated LOS: 7 days Discharge Concerns: Need to establish a safety plan; Medication compliance and effectiveness Discharge Goals: Return home with outpatient referrals for mental health follow-up including medication management/psychotherapy     Long Term Goal(s): Minimizing disruption current psychiatric diagnosis is causing so that patient can be safely discharged Short Term Goals: Compliance with proposed treatment plan and adjusting to psychiatric unit and peers.    Park Pope, MD 02/10/2022, 7:29 AM

## 2022-02-10 NOTE — Group Note (Signed)
LCSW Group Therapy Note   Group Date: 02/10/2022 Start Time: 1300 End Time: 1400  Type of Therapy/Topic:  Group Therapy:  Balance in Life   Participation Level:  Active   Description of Group:    This group will address the concept of balance and how it feels and looks when one is unbalanced. Patients will be encouraged to process areas in their lives that are out of balance and identify reasons for remaining unbalanced. Facilitators will guide patients in utilizing problem-solving interventions to address and correct the stressor making their life unbalanced. Understanding and applying boundaries will be explored and addressed for obtaining and maintaining a balanced life. Patients will be encouraged to explore ways to assertively make their unbalanced needs known to significant others in their lives, using other group members and facilitator for support and feedback.   Therapeutic Goals: Patient will identify two or more emotions or situations they have that consume much of in their lives. Patient will identify two ways to set boundaries in order to achieve balance in their lives:  3. Identify wants, needs, and ways to incorporate self-care and coping skills into their daily life.    Summary of Patient Progress:  The Pt attended group and remained there the entire time.  The Pt accepted all worksheets and participated in the discussion openly.  The Pt was appropriate with peers and was able to find ways to create balance within their daily life.      Therapeutic Modalities:   Cognitive Behavioral Therapy Solution-Focused Therapy Assertiveness Training  [name], [place] 02/10/2022  1:46 PM

## 2022-02-10 NOTE — [facility] Group Notes (Signed)
Group date: 02/10/2022 Group time: 1430  Group topic: Resources and Support Systems\  Focus of group to help patient identify sources of support systems and different resources.   Patient attended group and was attentive. Engaged in discussion. Identified family and friends as support system.

## 2022-02-10 NOTE — Progress Notes (Signed)
The focus of this group is to help patients review their daily goal of treatment and discuss progress on daily workbooks. Pt did not attend the evening group. 

## 2022-02-10 NOTE — Progress Notes (Signed)
   02/10/22 0600  Psych Admission Type (Psych Patients Only)  Admission Status Involuntary  Psychosocial Assessment  Patient Complaints Isolation;Depression  Eye Contact Brief  Facial Expression Anxious  Affect Apprehensive  Speech Soft;Logical/coherent  Interaction Assertive  Motor Activity Slow  Appearance/Hygiene Unremarkable  Behavior Characteristics Appropriate to situation  Mood Depressed  Thought Process  Coherency WDL  Content WDL  Delusions None reported or observed  Perception WDL  Hallucination None reported or observed  Judgment WDL  Confusion None  Danger to Self  Current suicidal ideation? Denies  Agreement Not to Harm Self Yes  Description of Agreement verbal  Danger to Others  Danger to Others None reported or observed

## 2022-02-10 NOTE — Progress Notes (Signed)
Pt presents with fixed smile, fair eye contact and is ambulatory in milieu with slow but steady gait. Denies SI, HI, AVH and pain when assessed. Observed to be preoccupied, noted talking to self at medication window and while pacing hall. Needed verbal redirections from back exit door twice this shift as he was playing with the knob; attempting to open door knob.  Attended scheduled groups, engaged in activities on and off unit. Remains medication compliant without issues. Safety checks maintained at Q 15 minutes intervals without outburst. Verbal education done on current regimen and effects monitored. Support, reassurance and encouragement offered. Pt tolerates all meals and fluids well. Remains redirectable and cooperative with care.

## 2022-02-11 NOTE — Progress Notes (Signed)
   02/10/22 2200  Psych Admission Type (Psych Patients Only)  Admission Status Involuntary  Psychosocial Assessment  Patient Complaints Isolation;Anxiety  Eye Contact Brief  Facial Expression Animated  Affect Appropriate to circumstance  Speech Logical/coherent  Interaction Assertive  Motor Activity Slow  Appearance/Hygiene Unremarkable  Behavior Characteristics Cooperative  Mood Depressed;Preoccupied  Thought Process  Coherency WDL  Content WDL  Delusions None reported or observed  Perception WDL  Hallucination None reported or observed  Judgment WDL  Confusion None  Danger to Self  Current suicidal ideation? Denies  Agreement Not to Harm Self Yes  Description of Agreement verbal  Danger to Others  Danger to Others None reported or observed

## 2022-02-11 NOTE — Progress Notes (Signed)
Pt is isolative and resting in his room during time of assessment. Pt is minimal with responses. Pt rates depression 0/10 and anxiety 0/10. Pt reports a good appetite, and no physical problems. Pt denies SI/HI/AVH and verbally contracts for safety. Provided support and encouragement. Pt safe on the unit. Q 15 minute safety checks continued.

## 2022-02-11 NOTE — Progress Notes (Addendum)
[facility] MD Progress Note  02/11/2022 8:36 AM Billy Rose  MRN:  [id number] Subjective:   Billy Rose is a 21 y.o. male with psychiatric hx of schizoaffective disorder bipolar type, PTSD, generalized anxiety disorder who presented to [facility] under IVC due to concerns for suicide attempt as well as worsening psychosis for past 6 months.  On assessment today, the patient continues to appear guarded. Denies any acute concerns. Discussed we would check with mother regarding safety planning and assessment of baseline and patient was amenable to this. He continues to be amenable to getting an LAI if it will expedite his discharge.  He denies experiencing medication side effects.  He reports good sleep and appetite.  He denies experiencing auditory or visual hallucinations.  He denies obstructive symptoms.  He denies suicidal homicidal thoughts. States he is excited to discharge in order to take care of his cat.  Patient continues to be guarded but less internally preoccupied. Per nursing report, patient continues pacing the hall responding to internal stimuli. When asked about his last AH, he endorsed last week despite reporting to me it was a month ago so seems inconsistent historian depending on day.   Spoke with collateral (mother) Billy Rose over phone: Mother reports Billy Rose is doing better but appeared more sedated for past 2 days.  Mother reiterated concerns that patient would become noncompliant with medications but also was surprised when she found out that he was not on a mood stabilizer.  Discussed that our primary concern would be to address psychosis which would be better addressed with haloperidol and then any mood lability or mood stabilization required after that would be adjusted with Depakote.  Mother voiced understanding but did express some concern regarding long-acting injectable as she did not want him to be extremely sedated during the daytime.  Discussed that we would be doing room lockout for  him as he is inconsistent with attending group and mother was amenable to this.  Mother stated that she would visit Billy Rose today to assess psychosis and depression. No further questions at this time.  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 No pertinent surgical history. Family History:  Family History  Problem Relation Age of Onset   Allergic rhinitis Neg Hx    Asthma Neg Hx    Eczema Neg Hx    Urticaria Neg Hx    Family Psychiatric  History: per H and P Social History:  Social History   Substance and Sexual Activity  Alcohol Use Yes   Comment: "4 beers every so often"     Social History   Substance and Sexual Activity  Drug Use Not Currently   Types: Cocaine, LSD, Marijuana, IV    Social History   Socioeconomic History   Marital status: Single    Spouse name: Not on file   Number of children: Not on file   Years of education: Not on file   Highest education level: Not on file  Occupational History   Not on file  Tobacco Use   Smoking  status: Every Day    Packs/day: 1.00    Types: Cigarettes   Smokeless tobacco: Never  Vaping Use   Vaping Use: Never used  Substance and Sexual Activity   Alcohol use: Yes    Comment: "4 beers every so often"   Drug use: Not Currently    Types: Cocaine, LSD, Marijuana, IV   Sexual activity: Not Currently  Other Topics Concern   Not on file  Social History Narrative   ** Merged History Encounter **       Social Determinants of Health    Financial Resource Strain: Not on file  Food Insecurity: Unknown (02/07/2022)   Hunger Vital Sign    Worried About Running Out of Food in the Last Year: Patient refused    Ran Out of Food in the Last Year: Patient refused  Transportation Needs: Unknown (02/07/2022)   PRAPARE - Administrator, Civil Service (Medical): Not on file    Lack of Transportation (Non-Medical): Patient refused  Physical Activity: Not on file  Stress: Not on file  Social Connections: Not on file   Additional Social History:                         Sleep: Fair  Appetite:  Fair  Current Medications: Current Facility-Administered Medications  Medication Dose Route Frequency Provider Last Rate Last Admin   acetaminophen (TYLENOL) tablet 650 mg  650 mg Oral Q6H PRN Jackelyn Poling, NP       alum & mag hydroxide-simeth (MAALOX/MYLANTA) 200-200-20 MG/5ML suspension 30 mL  30 mL Oral Q4H PRN Jackelyn Poling, NP       benztropine (COGENTIN) tablet 1 mg  1 mg Oral BID PRN Park Pope, MD       haloperidol (HALDOL) tablet 5 mg  5 mg Oral Q8H PRN Park Pope, MD       And   LORazepam (ATIVAN) tablet 2 mg  2 mg Oral Q8H PRN Park Pope, MD       And   diphenhydrAMINE (BENADRYL) capsule 25 mg  25 mg Oral Q8H PRN Park Pope, MD       haloperidol lactate (HALDOL) injection 5 mg  5 mg Intramuscular Q6H PRN Park Pope, MD       And   LORazepam (ATIVAN) injection 2 mg  2 mg Intramuscular Q6H PRN Park Pope, MD       And   diphenhydrAMINE (BENADRYL) injection 50 mg  50 mg Intravenous Q6H PRN Park Pope, MD       feeding supplement (ENSURE ENLIVE / ENSURE PLUS) liquid 237 mL  237 mL Oral BID BM Nkwenti, Doris, NP   237 mL at 02/10/22 1325   FLUoxetine (PROZAC) capsule 10 mg  10 mg Oral Daily Park Pope, MD   10 mg at 02/10/22 0818   haloperidol (HALDOL) tablet 10 mg  10 mg Oral BID Park Pope, MD   10 mg at 02/10/22 1728   hydrOXYzine (ATARAX) tablet 25 mg  25 mg Oral TID PRN Park Pope, MD   25 mg at  02/10/22 2121   magnesium hydroxide (MILK OF MAGNESIA) suspension 30 mL  30 mL Oral Daily PRN Nira Conn A, NP       nicotine (NICODERM CQ - dosed in mg/24 hours) patch 14 mg  14 mg Transdermal Daily Sindy Guadeloupe, NP       traZODone (DESYREL) tablet 50 mg  50 mg Oral QHS PRN Nira Conn  A, NP   50 mg at 02/10/22 2121    Lab Results:  No results found for this or any previous visit (from the past 48 hour(s)).   Blood Alcohol level:  Lab Results  Component Value Date   ETH <10 02/05/2022   ETH <10 04/16/2021    Metabolic Disorder Labs: Lab Results  Component Value Date   HGBA1C 5.1 02/07/2022   MPG 99.67 02/07/2022   MPG 102.54 10/10/2020   Lab Results  Component Value Date   PROLACTIN 24.1 (H) 05/22/2020   PROLACTIN 32.1 (H) 02/15/2017   Lab Results  Component Value Date   CHOL 128 02/07/2022   TRIG 81 02/07/2022   HDL 51 02/07/2022   CHOLHDL 2.5 02/07/2022   VLDL 16 02/07/2022   LDLCALC 61 02/07/2022   LDLCALC 52 10/10/2020    Physical Findings:   Musculoskeletal: Strength & Muscle Tone: within normal limits Gait & Station: normal Patient leans: N/A  Psychiatric Specialty Exam:  Presentation  General Appearance: Appropriate for Environment; Casual  Eye Contact:Minimal  Speech:Clear and Coherent; Normal Rate  Speech Volume:Normal  Handedness:Right   Mood and Affect  Mood:Euthymic  Affect: constricted  Thought Process  Thought Processes:Goal Directed; Coherent  Descriptions of Associations:Intact  Orientation:Full (Time, Place and Person)  Thought Content:Logical  History of Schizophrenia/Schizoaffective disorder:No data recorded Duration of Psychotic Symptoms:No data recorded Hallucinations:Hallucinations: None   Ideas of Reference:None  Suicidal Thoughts:Suicidal Thoughts: No   Homicidal Thoughts:Homicidal Thoughts: No    Sensorium  Memory:Immediate Fair; Recent Fair; Remote  Fair  Judgment:Impaired  Insight:Fair   Executive Functions  Concentration:Fair  Attention Span:Fair  Recall:Fair  Fund of Knowledge:Fair  Language:Fair   Psychomotor Activity  Psychomotor Activity:Psychomotor Activity: Normal    Assets  Assets:Communication Skills; Desire for Improvement; Financial Resources/Insurance; Physical Health   Sleep  Sleep:Sleep: Good     Physical Exam: Physical Exam Constitutional:      Appearance: the patient is not toxic-appearing.  Pulmonary:     Effort: Pulmonary effort is normal.  Neurological:     General: No focal deficit present.     Mental Status: the patient is alert and oriented to person, place, and time.   Review of Systems  Respiratory:  Negative for shortness of breath.   Cardiovascular:  Negative for chest pain.  Gastrointestinal:  Negative for abdominal pain, constipation, diarrhea, nausea and vomiting.  Neurological:  Negative for headaches.   Blood pressure 98/63, pulse (!) 118, temperature 97.9 F (36.6 C), temperature source Oral, resp. rate 14, height 5\' 11"  (1.803 m), weight 59 kg, SpO2 98 %. Body mass index is 18.13 kg/m.   Treatment Plan Summary: Daily contact with patient to assess and evaluate symptoms and progress in treatment and Medication management  Treatment Plan Summary: Billy Rose is a 21 y.o. male with psychiatric hx of schizoaffective disorder bipolar type, PTSD, generalized anxiety disorder who presented to [facility] under IVC due to concerns for suicide attempt as well as worsening psychosis for past 6 months.   Safety and Monitoring: INVOLUTARILY (safety) admission to inpatient psychiatric unit for safety, stabilization and treatment Daily contact with patient to assess and evaluate symptoms and progress in treatment Appropriate medication management to further stabilize patient Patient's case will be regularly discussed in multi-disciplinary team meeting Observation Level : q15 minute  checks Vital signs: q12 hours Precautions: suicide, elopement, and assault   2. Psychiatric Problems Schizoaffective Disorder, Bipolar Type  Medication Noncompliance -Discontinue Risperdal -Continue Haloperidol to 10 mg BID for psychosis --The risks/benefits/side-effects/alternatives  to this medication were discussed in detail with the patient and time was given for questions. The patient consents to medication trial.  --Continues to be amenable to LAI             -- Metabolic profile and EKG monitoring obtained while on an atypical antipsychotic (BMI: 18.13 Lipid Panel: WNL HbgA1c: WNL QTc: 449)  -Cogentin 1 mg bid prn for EPS -Continue Prozac 10 mg for depression daily -Agitation protocol added for PO and IM (if refusing PO): ativan, haldol, and benadryl             -- Encouraged patient to participate in unit milieu and in scheduled group therapies              -- Short Term Goals: Ability to identify changes in lifestyle to reduce recurrence of condition will improve, Ability to verbalize feelings will improve, Ability to disclose and discuss suicidal ideas, and Ability to demonstrate self-control will improve             -- Long Term Goals: Improvement in symptoms so as ready for discharge   3. Medical Management Labs and Imaging Patient's recent labs were reviewed and pertinent positives and negatives are listed below: CMP: wnl CBC: wnl EtOH:  Recent Labs       Lab Results  Component Value Date    ETH <10 02/05/2022    ETH <10 04/16/2021      UDS: negative TSH: nml A1C: nml Lipids: nml   Medical Problems none   PRN The following PRN medications were added to ensure patient can focus on treatment. These were discussed with patient and patient aware of ability to ask for the following medications:  -Tylenol 650 mg q6hr PRN for mild pain -Mylanta 30 ml suspension for indigestion -Milk of Magnesia 30 ml for constipation -Trazodone 50 mg qhs for insomnia -Hydroxyzine  25 mg tid PRN for anxiety   4. Discharge Planning Patient will require the following based on my assessment:  Greatly appreciate CSW and Case management assistance with facilitating these needs and any further recommendations regarding patient's needs upon discharge. Estimated LOS: 7 days Discharge Concerns: Need to establish a safety plan; Medication compliance and effectiveness Discharge Goals: Return home with outpatient referrals for mental health follow-up including medication management/psychotherapy     Long Term Goal(s): Minimizing disruption current psychiatric diagnosis is causing so that patient can be safely discharged Short Term Goals: Compliance with proposed treatment plan and adjusting to psychiatric unit and peers.    Park Pope, MD 02/11/2022, 8:36 AM

## 2022-02-11 NOTE — Progress Notes (Signed)
The focus of this group is to help patients review their daily goal of treatment and discuss progress on daily workbooks. Pt did not attend the evening group. 

## 2022-02-11 NOTE — Group Note (Signed)
Recreation Therapy Group Note   Group Topic:Team Building  Group Date: 02/11/2022 Start Time: 1000 End Time: 1030 Facilitators: Caroll Rancher, LRT,CTRS Location: [address] Dayroom   Goal Area(s) Addresses:  Patient will effectively work with peer towards shared goal.  Patient will identify skills used to make activity successful.  Patient will identify how skills used during activity can be used to reach post d/c goals.   Group Description: Landing Pad. In teams of 3-5, patients were given 12 plastic drinking straws and an equal length of masking tape. Using the materials provided, patients were asked to build a landing pad to catch a golf ball dropped from approximately 5 feet in the air. All materials were required to be used by the team in their design. LRT facilitated post-activity discussion.   Affect/Mood: N/A   Participation Level: Did not attend    Clinical Observations/Individualized Feedback:     Plan: Continue to engage patient in RT group sessions 2-3x/week.   Caroll Rancher, LRT,CTRS 02/11/2022 12:40 PM

## 2022-02-11 NOTE — BHH Group Notes (Signed)
Adult Psychoeducational Group Note  Date:  02/11/2022 Time:  10:18 AM  Group Topic/Focus:  Goals Group:   The focus of this group is to help patients establish daily goals to achieve during treatment and discuss how the patient can incorporate goal setting into their daily lives to aide in recovery.  Participation Level:  Did Not Attend  Additional Comments:  Patient was told multiple times that group was starting, but still did not attend.   Shaliyah Taite T Keesha Pellum 02/11/2022, 10:18 AM

## 2022-02-11 NOTE — Progress Notes (Signed)
   02/11/22 0900  Psych Admission Type (Psych Patients Only)  Admission Status Involuntary  Psychosocial Assessment  Patient Complaints Isolation  Eye Contact Brief  Facial Expression Pensive  Affect Appropriate to circumstance  Speech Logical/coherent  Interaction Assertive  Motor Activity Slow  Appearance/Hygiene In scrubs  Behavior Characteristics Cooperative  Mood Preoccupied  Thought Process  Coherency WDL  Content WDL  Delusions None reported or observed  Perception WDL  Hallucination None reported or observed  Judgment WDL  Confusion None  Danger to Self  Current suicidal ideation? Denies  Danger to Others  Danger to Others None reported or observed

## 2022-02-12 ENCOUNTER — Encounter (HOSPITAL_COMMUNITY): Payer: Self-pay

## 2022-02-12 MED ORDER — HALOPERIDOL DECANOATE 100 MG/ML IM SOLN
100.0000 mg | Freq: Once | INTRAMUSCULAR | Status: AC
  Administered 2022-02-12: 100 mg via INTRAMUSCULAR
  Filled 2022-02-12: qty 1

## 2022-02-12 MED ORDER — HALOPERIDOL DECANOATE 100 MG/ML IM SOLN
100.0000 mg | INTRAMUSCULAR | Status: DC
Start: 2022-02-14 — End: 2022-02-14
  Administered 2022-02-14: 100 mg via INTRAMUSCULAR
  Filled 2022-02-12: qty 1

## 2022-02-12 NOTE — BH IP Treatment Plan (Signed)
Interdisciplinary Treatment and Diagnostic Plan Update  02/12/2022 Time of Session: 9:30am  Billy Rose MRN: [id number]  Principal Diagnosis: Schizoaffective disorder, bipolar type (HCC)  Secondary Diagnoses: Principal Problem:   Schizoaffective disorder, bipolar type (HCC) Active Problems:   Affective psychosis, bipolar (HCC)   Current Medications:  Current Facility-Administered Medications  Medication Dose Route Frequency Provider Last Rate Last Admin   acetaminophen (TYLENOL) tablet 650 mg  650 mg Oral Q6H PRN Jackelyn Poling, NP       alum & mag hydroxide-simeth (MAALOX/MYLANTA) 200-200-20 MG/5ML suspension 30 mL  30 mL Oral Q4H PRN Jackelyn Poling, NP       benztropine (COGENTIN) tablet 1 mg  1 mg Oral BID PRN Park Pope, MD       haloperidol (HALDOL) tablet 5 mg  5 mg Oral Q8H PRN Park Pope, MD       And   LORazepam (ATIVAN) tablet 2 mg  2 mg Oral Q8H PRN Park Pope, MD       And   diphenhydrAMINE (BENADRYL) capsule 25 mg  25 mg Oral Q8H PRN Park Pope, MD       haloperidol lactate (HALDOL) injection 5 mg  5 mg Intramuscular Q6H PRN Park Pope, MD       And   LORazepam (ATIVAN) injection 2 mg  2 mg Intramuscular Q6H PRN Park Pope, MD       And   diphenhydrAMINE (BENADRYL) injection 50 mg  50 mg Intravenous Q6H PRN Park Pope, MD       feeding supplement (ENSURE ENLIVE / ENSURE PLUS) liquid 237 mL  237 mL Oral BID BM Nkwenti, Doris, NP   237 mL at 02/12/22 0916   FLUoxetine (PROZAC) capsule 10 mg  10 mg Oral Daily Park Pope, MD   10 mg at 02/12/22 0804   haloperidol (HALDOL) tablet 10 mg  10 mg Oral BID Park Pope, MD   10 mg at 02/12/22 0804   haloperidol decanoate (HALDOL DECANOATE) 100 MG/ML injection 100 mg  100 mg Intramuscular Once Park Pope, MD       hydrOXYzine (ATARAX) tablet 25 mg  25 mg Oral TID PRN Park Pope, MD   25 mg at 02/10/22 2121   magnesium hydroxide (MILK OF MAGNESIA) suspension 30 mL  30 mL Oral Daily PRN Jackelyn Poling, NP       nicotine (NICODERM CQ  - dosed in mg/24 hours) patch 14 mg  14 mg Transdermal Daily Sindy Guadeloupe, NP       traZODone (DESYREL) tablet 50 mg  50 mg Oral QHS PRN Nira Conn A, NP   50 mg at 02/10/22 2121   PTA Medications: Medications Prior to Admission  Medication Sig Dispense Refill Last Dose   FLUoxetine (PROZAC) 10 MG capsule Take 10 mg by mouth daily.      risperiDONE (RISPERDAL) 3 MG tablet Take 3 mg by mouth at bedtime.       Patient Stressors: Other: "Nothing"    Patient Strengths: Supportive family/friends   Treatment Modalities: Medication Management, Group therapy, Case management,  1 to 1 session with clinician, Psychoeducation, Recreational therapy.   Physician Treatment Plan for Primary Diagnosis: Schizoaffective disorder, bipolar type (HCC) Long Term Goal(s): Improvement in symptoms so as ready for discharge   Short Term Goals: Ability to identify changes in lifestyle to reduce recurrence of condition will improve Ability to verbalize feelings will improve Ability to disclose and discuss suicidal ideas Ability to demonstrate self-control will improve  Medication  Management: Evaluate patient's response, side effects, and tolerance of medication regimen.  Therapeutic Interventions: 1 to 1 sessions, Unit Group sessions and Medication administration.  Evaluation of Outcomes: Progressing  Physician Treatment Plan for Secondary Diagnosis: Principal Problem:   Schizoaffective disorder, bipolar type (HCC) Active Problems:   Affective psychosis, bipolar (HCC)  Long Term Goal(s): Improvement in symptoms so as ready for discharge   Short Term Goals: Ability to identify changes in lifestyle to reduce recurrence of condition will improve Ability to verbalize feelings will improve Ability to disclose and discuss suicidal ideas Ability to demonstrate self-control will improve     Medication Management: Evaluate patient's response, side effects, and tolerance of medication  regimen.  Therapeutic Interventions: 1 to 1 sessions, Unit Group sessions and Medication administration.  Evaluation of Outcomes: Progressing   RN Treatment Plan for Primary Diagnosis: Schizoaffective disorder, bipolar type (HCC) Long Term Goal(s): Knowledge of disease and therapeutic regimen to maintain health will improve  Short Term Goals: Ability to remain free from injury will improve, Ability to participate in decision making will improve, Ability to verbalize feelings will improve, Ability to disclose and discuss suicidal ideas, and Ability to identify and develop effective coping behaviors will improve  Medication Management: RN will administer medications as ordered by provider, will assess and evaluate patient's response and provide education to patient for prescribed medication. RN will report any adverse and/or side effects to prescribing provider.  Therapeutic Interventions: 1 on 1 counseling sessions, Psychoeducation, Medication administration, Evaluate responses to treatment, Monitor vital signs and CBGs as ordered, Perform/monitor CIWA, COWS, AIMS and Fall Risk screenings as ordered, Perform wound care treatments as ordered.  Evaluation of Outcomes: Progressing   LCSW Treatment Plan for Primary Diagnosis: Schizoaffective disorder, bipolar type (HCC) Long Term Goal(s): Safe transition to appropriate next level of care at discharge, Engage patient in therapeutic group addressing interpersonal concerns.  Short Term Goals: Engage patient in aftercare planning with referrals and resources, Increase social support, Increase emotional regulation, Facilitate acceptance of mental health diagnosis and concerns, Identify triggers associated with mental health/substance abuse issues, and Increase skills for wellness and recovery  Therapeutic Interventions: Assess for all discharge needs, 1 to 1 time with Social worker, Explore available resources and support systems, Assess for adequacy  in community support network, Educate family and significant other(s) on suicide prevention, Complete Psychosocial Assessment, Interpersonal group therapy.  Evaluation of Outcomes: Progressing   Progress in Treatment: Attending groups: No. Participating in groups: No. Taking medication as prescribed: Yes. Toleration medication: Yes. Family/Significant other contact made: Yes, individual(s) contacted:  Brentin Shin [phone] (Mother) Patient understands diagnosis: Yes. Discussing patient identified problems/goals with staff: Yes. Medical problems stabilized or resolved: Yes. Denies suicidal/homicidal ideation: No. Issues/concerns per patient self-inventory: Yes. Other: none   New problem(s) identified: No, Describe:  none   New Short Term/Long Term Goal(s): Patient to work towards elimination of symptoms of psychosis, medication management for mood stabilization; elimination of SI thoughts; development of comprehensive mental wellness plan.   Patient Goals:  Patient states their goal for treatment is to "get out, get on new medication."   Discharge Plan or Barriers: No psychosocial barriers identified at this time, patient to return to place of residence when appropriate for discharge.    Reason for Continuation of Hospitalization: Other; describe psychosis    Estimated Length of Stay: 1-7 days    Last 3 [place] Suicide Severity Risk Score: Flowsheet Row Ad[facility] (Current) [facility] from  [facility]05/2022 [facility] to Hosp-Admission (Discharged) from 04/16/2021 in [facility]STEPDOWN  C-SSRS RISK CATEGORY Low Risk High Risk High Risk       Last PHQ 2/9 Scores:     No data to display          Scribe for Treatment Team: [name], Theresia Majors 02/12/2022 10:40 AM

## 2022-02-12 NOTE — Progress Notes (Signed)
Adult Psychoeducational Group Note  Date:  02/12/2022 Time:  8:53 PM  Group Topic/Focus:  Wrap-Up Group:   The focus of this group is to help patients review their daily goal of treatment and discuss progress on daily workbooks.  Participation Level:  Did Not Attend  Participation Quality:   n/a  Affect:   n/a  Cognitive:   n/a  Insight: None  Engagement in Group:   n/a  Modes of Intervention:   n/a  Additional Comments:   Pt did not attend the Wrap Up group.   Edmund Hilda Raelle Chambers 02/12/2022, 8:53 PM

## 2022-02-12 NOTE — Group Note (Signed)
LCSW Group Therapy Note   Group Date: 02/12/2022 Start Time: 1300 End Time: 1400  Type of Therapy and Topic:  Group Therapy:  Healthy and Unhealthy Supports  Participation Level:  Did Not Attend   Description of Group:  Patients in this group were introduced to the idea of adding a variety of healthy supports to address the various needs in their lives, especially in reference to their plans and focus for the new year.  Patients discussed what additional healthy supports could be helpful in their recovery and wellness after discharge in order to prevent future hospitalizations.   An emphasis was placed on using counselor, doctor, therapy groups, 12-step groups, and problem-specific support groups to expand supports.    Therapeutic Goals:   1)  discuss importance of adding supports to stay well once out of the hospital  2)  compare healthy versus unhealthy supports and identify some examples of each  3)  generate ideas and descriptions of healthy supports that can be added  4)  offer mutual support about how to address unhealthy supports  5)  encourage active participation in and adherence to discharge plan    Summary of Patient Progress:  Patient came to group at the very end of the session.  Pt did accept the worksheet that was provided.    Therapeutic Modalities:   Motivational Interviewing Brief Solution-Focused Therapy  [name], Theresia Majors 02/12/2022  1:36 PM

## 2022-02-12 NOTE — Group Note (Signed)
Date:  02/12/2022 Time:  9:53 AM  Group Topic/Focus:  Goals Group:   The focus of this group is to help patients establish daily goals to achieve during treatment and discuss how the patient can incorporate goal setting into their daily lives to aide in recovery. Orientation:   The focus of this group is to educate the patient on the purpose and policies of crisis stabilization and provide a format to answer questions about their admission.  The group details unit policies and expectations of patients while admitted.    Participation Level:  Minimal  Participation Quality:  Drowsy  Affect:  Appropriate  Cognitive:  Appropriate  Insight: Appropriate  Engagement in Group:  Engaged  Modes of Intervention:  Discussion  Additional Comments:  Pt wants to focus on discharge planning and coming to groups.  Jaquita Rector 02/12/2022, 9:53 AM

## 2022-02-12 NOTE — Progress Notes (Signed)
Pt cooperative with care, denies SI, HI, AVH and pain. Attended scheduled group this morning and was engaged in activities. Declined CSW group on initial attempts; finally attended group towards the end of shift with reminder about room lockout. Remains medication compliant, cooperative with mouth checks. Received scheduled Haldol Dec as ordered. Safety checks maintained without outburst. Verbal education provided on current treatment regimen and pt verbalized understanding. Emotional support and reassurance offered to pt. Tolerated all meals and fluids well. Denies concerns at this time.

## 2022-02-12 NOTE — Progress Notes (Signed)
   02/12/22 2100  Psych Admission Type (Psych Patients Only)  Admission Status Involuntary  Psychosocial Assessment  Patient Complaints None  Eye Contact Fair  Facial Expression Anxious;Animated  Affect Appropriate to circumstance  Speech Logical/coherent  Interaction Assertive  Motor Activity Slow  Appearance/Hygiene In scrubs  Behavior Characteristics Cooperative  Mood Suspicious;Preoccupied  Aggressive Behavior  Effect No apparent injury  Thought Process  Coherency WDL  Content Preoccupation  Delusions None reported or observed  Perception WDL  Hallucination None reported or observed  Judgment WDL  Confusion None  Danger to Self  Current suicidal ideation? Denies

## 2022-02-12 NOTE — [facility] Counselor (Signed)
CSW met with the Pt and the Pt's ACTT worker, Genene Churn in the Pt's room.  The ACT worker states that she is working on getting the Pt into therapy with [facility].  She states that the Pt has been working with [facility] for the past 3 months.  She states that she will be at the Pt's discharge on 02/14/2022 at 11:30am to review the discharge plans with the Pt.  The Pt states that he will be calling his mother to come pick him up after discharge.  Mrs. Cornelia Copa also states that the ACT Team will be out to the Pt's home on 02/14/2022 and will come out for at least 3 days after that.

## 2022-02-12 NOTE — Progress Notes (Signed)
[facility] MD Progress Note  02/12/2022 1:24 PM Billy Rose  MRN:  [id number] Subjective:   Billy Rose is a 21 y.o. male with psychiatric hx of schizoaffective disorder bipolar type, PTSD, generalized anxiety disorder who presented to [facility] under IVC due to concerns for suicide attempt as well as worsening psychosis for past 6 months.  On assessment today, patient reports to doing well and having no somatic complaints.  Patient persistently denied all symptoms related to mood, psychosis, or physical complaints.  Patient reports that his sleep and appetite have largely been unchanged.  Denies SI/HI/AVH, paranoia, ideas of reference.  Patient reports doing well and has no further questions at this time.  Patient still amenable to Haldol LAI.  Discussed with patient that he would receive half of the dose today and then the other half on Friday and then be initiated on monthly injections with an oral bridge of approximately 2 weeks.  Patient was agreeable to this plan and had no further questions at this time.  Patient appears occasionally pacing but significantly less so than admission and appears more logical and coherent during assessment today.  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 No pertinent surgical history. Family History:  Family History  Problem Relation Age of Onset   Allergic rhinitis Neg Hx    Asthma Neg Hx    Eczema Neg Hx    Urticaria Neg Hx    Family Psychiatric  History: per H and  P Social History:  Social History   Substance and Sexual Activity  Alcohol Use Yes   Comment: "4 beers every so often"     Social History   Substance and Sexual Activity  Drug Use Not Currently   Types: Cocaine, LSD, Marijuana, IV    Social History   Socioeconomic History   Marital status: Single    Spouse name: Not on file   Number of children: Not on file   Years of education: Not on file   Highest education level: Not on file  Occupational History   Not on file  Tobacco Use   Smoking status: Every Day    Packs/day: 1.00    Types: Cigarettes   Smokeless tobacco: Never  Vaping Use   Vaping Use: Never used  Substance and Sexual Activity   Alcohol use: Yes    Comment: "4 beers every so often"   Drug use: Not Currently    Types: Cocaine, LSD, Marijuana, IV   Sexual activity: Not Currently  Other Topics Concern   Not on file  Social History Narrative   ** Merged History Encounter **       Social Determinants of Health   Financial Resource Strain: Not on file  Food Insecurity: Unknown (02/07/2022)   Hunger Vital Sign    Worried About Running Out of Food in the Last Year: Patient refused    Ran Out of Food in the Last Year: Patient refused  Transportation Needs: Unknown (02/07/2022)   PRAPARE - Administrator, Civil Service (Medical): Not on  file    Lack of Transportation (Non-Medical): Patient refused  Physical Activity: Not on file  Stress: Not on file  Social Connections: Not on file   Additional Social History:                         Sleep: Fair  Appetite:  Fair  Current Medications: Current Facility-Administered Medications  Medication Dose Route Frequency Provider Last Rate Last Admin   acetaminophen (TYLENOL) tablet 650 mg  650 mg Oral Q6H PRN Jackelyn Poling, NP       alum & mag hydroxide-simeth (MAALOX/MYLANTA) 200-200-20 MG/5ML suspension 30 mL  30 mL Oral Q4H PRN Jackelyn Poling, NP       benztropine (COGENTIN) tablet 1 mg  1  mg Oral BID PRN Park Pope, MD       haloperidol (HALDOL) tablet 5 mg  5 mg Oral Q8H PRN Park Pope, MD       And   LORazepam (ATIVAN) tablet 2 mg  2 mg Oral Q8H PRN Park Pope, MD       And   diphenhydrAMINE (BENADRYL) capsule 25 mg  25 mg Oral Q8H PRN Park Pope, MD       haloperidol lactate (HALDOL) injection 5 mg  5 mg Intramuscular Q6H PRN Park Pope, MD       And   LORazepam (ATIVAN) injection 2 mg  2 mg Intramuscular Q6H PRN Park Pope, MD       And   diphenhydrAMINE (BENADRYL) injection 50 mg  50 mg Intravenous Q6H PRN Park Pope, MD       feeding supplement (ENSURE ENLIVE / ENSURE PLUS) liquid 237 mL  237 mL Oral BID BM Nkwenti, Doris, NP   237 mL at 02/12/22 0916   FLUoxetine (PROZAC) capsule 10 mg  10 mg Oral Daily Park Pope, MD   10 mg at 02/12/22 0804   haloperidol (HALDOL) tablet 10 mg  10 mg Oral BID Park Pope, MD   10 mg at 02/12/22 0804   [START ON 02/14/2022] haloperidol decanoate (HALDOL DECANOATE) 100 MG/ML injection 100 mg  100 mg Intramuscular Q30 days Park Pope, MD       hydrOXYzine (ATARAX) tablet 25 mg  25 mg Oral TID PRN Park Pope, MD   25 mg at 02/10/22 2121   magnesium hydroxide (MILK OF MAGNESIA) suspension 30 mL  30 mL Oral Daily PRN Nira Conn A, NP       nicotine (NICODERM CQ - dosed in mg/24 hours) patch 14 mg  14 mg Transdermal Daily Sindy Guadeloupe, NP       traZODone (DESYREL) tablet 50 mg  50 mg Oral QHS PRN Nira Conn A, NP   50 mg at 02/10/22 2121    Lab Results:  No results found for this or any previous visit (from the past 48 hour(s)).   Blood Alcohol level:  Lab Results  Component Value Date   ETH <10 02/05/2022   ETH <10 04/16/2021    Metabolic Disorder Labs: Lab Results  Component Value Date   HGBA1C 5.1 02/07/2022   MPG 99.67 02/07/2022   MPG 102.54 10/10/2020   Lab Results  Component Value Date   PROLACTIN 24.1 (H) 05/22/2020   PROLACTIN 32.1 (H) 02/15/2017   Lab Results  Component Value Date   CHOL 128 02/07/2022    TRIG 81 02/07/2022   HDL 51 02/07/2022   CHOLHDL 2.5 02/07/2022   VLDL 16 02/07/2022   LDLCALC  61 02/07/2022   LDLCALC 52 10/10/2020    Physical Findings:   Musculoskeletal: Strength & Muscle Tone: within normal limits Gait & Station: normal Patient leans: N/A  Psychiatric Specialty Exam:  Presentation  General Appearance: Appropriate for Environment; Casual  Eye Contact:Minimal  Speech:Clear and Coherent; Normal Rate  Speech Volume:Normal  Handedness:Right   Mood and Affect  Mood:Euthymic  Affect: constricted  Thought Process  Thought Processes:Goal Directed; Coherent  Descriptions of Associations:Intact  Orientation:Full (Time, Place and Person)  Thought Content:Logical  History of Schizophrenia/Schizoaffective disorder:No data recorded Duration of Psychotic Symptoms:No data recorded Hallucinations:Hallucinations: None   Ideas of Reference:None  Suicidal Thoughts:Suicidal Thoughts: No   Homicidal Thoughts:Homicidal Thoughts: No    Sensorium  Memory:Immediate Fair; Recent Fair; Remote Fair  Judgment:Impaired  Insight:Fair   Executive Functions  Concentration:Fair  Attention Span:Fair  Recall:Fair  Fund of Knowledge:Fair  Language:Fair   Psychomotor Activity  Psychomotor Activity:Psychomotor Activity: Normal    Assets  Assets:Communication Skills; Desire for Improvement; Financial Resources/Insurance; Physical Health   Sleep  Sleep:Sleep: Good     Physical Exam: Physical Exam Constitutional:      Appearance: the patient is not toxic-appearing.  Pulmonary:     Effort: Pulmonary effort is normal.  Neurological:     General: No focal deficit present.     Mental Status: the patient is alert and oriented to person, place, and time.   Review of Systems  Respiratory:  Negative for shortness of breath.   Cardiovascular:  Negative for chest pain.  Gastrointestinal:  Negative for abdominal pain, constipation, diarrhea,  nausea and vomiting.  Neurological:  Negative for headaches.   Blood pressure (!) 105/49, pulse 82, temperature 98.5 F (36.9 C), temperature source Oral, resp. rate 16, height 5\' 11"  (1.803 m), weight 59 kg, SpO2 98 %. Body mass index is 18.13 kg/m.   Treatment Plan Summary: Daily contact with patient to assess and evaluate symptoms and progress in treatment and Medication management  Treatment Plan Summary: Jarrod Mcenery is a 21 y.o. male with psychiatric hx of schizoaffective disorder bipolar type, PTSD, generalized anxiety disorder who presented to [facility] under IVC due to concerns for suicide attempt as well as worsening psychosis for past 6 months.   Safety and Monitoring: INVOLUTARILY (safety) admission to inpatient psychiatric unit for safety, stabilization and treatment Daily contact with patient to assess and evaluate symptoms and progress in treatment Appropriate medication management to further stabilize patient Patient's case will be regularly discussed in multi-disciplinary team meeting Observation Level : q15 minute checks Vital signs: q12 hours Precautions: suicide, elopement, and assault   2. Psychiatric Problems Schizoaffective Disorder, Bipolar Type  Medication Noncompliance -Discontinue Risperdal -Continue Haloperidol to 10 mg BID for psychosis --The risks/benefits/side-effects/alternatives to this medication were discussed in detail with the patient and time was given for questions. The patient consents to medication trial.  -Haloperidol Decanoate 100 mg 02/12/22, with 2nd dose to be done 02/14/22 -Will be discharged with : -Haloperidol Decanoate 200 mg monthly injections to begin 03/16/22 -Haloperidol PO 10 mg bid for 14 days as oral bridge             -- Metabolic profile and EKG monitoring obtained while on an atypical antipsychotic (BMI: 18.13 Lipid Panel: WNL HbgA1c: WNL QTc: 449)  -Cogentin 1 mg bid prn for EPS -Continue Prozac 10 mg for depression  daily -Agitation protocol added for PO and IM (if refusing PO): ativan, haldol, and benadryl             --  Encouraged patient to participate in unit milieu and in scheduled group therapies              -- Short Term Goals: Ability to identify changes in lifestyle to reduce recurrence of condition will improve, Ability to verbalize feelings will improve, Ability to disclose and discuss suicidal ideas, and Ability to demonstrate self-control will improve             -- Long Term Goals: Improvement in symptoms so as ready for discharge   3. Medical Management Labs and Imaging Patient's recent labs were reviewed and pertinent positives and negatives are listed below: CMP: wnl CBC: wnl EtOH:  Recent Labs       Lab Results  Component Value Date    ETH <10 02/05/2022    ETH <10 04/16/2021      UDS: negative TSH: nml A1C: nml Lipids: nml   Medical Problems none   PRN The following PRN medications were added to ensure patient can focus on treatment. These were discussed with patient and patient aware of ability to ask for the following medications:  -Tylenol 650 mg q6hr PRN for mild pain -Mylanta 30 ml suspension for indigestion -Milk of Magnesia 30 ml for constipation -Trazodone 50 mg qhs for insomnia -Hydroxyzine 25 mg tid PRN for anxiety   4. Discharge Planning Patient will require the following based on my assessment:  Greatly appreciate CSW and Case management assistance with facilitating these needs and any further recommendations regarding patient's needs upon discharge. Estimated LOS: 7 days Discharge Concerns: Need to establish a safety plan; Medication compliance and effectiveness Discharge Goals: Return home with outpatient referrals for mental health follow-up including medication management/psychotherapy Per LCSW note today, patient has an ACT team that will see him on 02/14/22 11:30 AM for discharge planning and mother will pick patient up thereafter   Park Pope,  MD 02/12/2022, 1:24 PM

## 2022-02-13 DIAGNOSIS — F250 Schizoaffective disorder, bipolar type: Secondary | ICD-10-CM

## 2022-02-13 NOTE — Progress Notes (Signed)
Adult Psychoeducational Group Note  Date:  02/13/2022 Time:  8:50 PM  Group Topic/Focus:  Wrap-Up Group:   The focus of this group is to help patients review their daily goal of treatment and discuss progress on daily workbooks.  Participation Level:  Did Not Attend  Participation Quality:   n/a  Affect:   n/a  Cognitive:   n/a  Insight: None  Engagement in Group:   n/a  Modes of Intervention:   n/a  Additional Comments:   Pt did not attend the Wrap Up group.  Edmund Hilda Abelina Ketron 02/13/2022, 8:50 PM

## 2022-02-13 NOTE — Progress Notes (Signed)
   02/13/22 0500  Sleep  Number of Hours 9.75

## 2022-02-13 NOTE — Progress Notes (Signed)
   02/13/22 2100  Psych Admission Type (Psych Patients Only)  Admission Status Involuntary  Psychosocial Assessment  Patient Complaints None  Eye Contact Fair  Facial Expression Anxious;Animated  Affect Appropriate to circumstance  Speech Logical/coherent  Interaction Assertive  Motor Activity Slow  Appearance/Hygiene In scrubs  Behavior Characteristics Cooperative  Mood Preoccupied  Aggressive Behavior  Effect No apparent injury  Thought Process  Coherency WDL  Content Preoccupation  Delusions None reported or observed  Perception WDL  Hallucination None reported or observed  Judgment WDL  Confusion None  Danger to Self  Current suicidal ideation? Denies

## 2022-02-13 NOTE — Progress Notes (Signed)
   02/13/22 1100  Psych Admission Type (Psych Patients Only)  Admission Status Involuntary  Psychosocial Assessment  Patient Complaints None  Eye Contact Fair  Facial Expression Anxious  Affect Appropriate to circumstance  Speech Logical/coherent  Interaction Assertive  Motor Activity Slow  Appearance/Hygiene In scrubs  Behavior Characteristics Cooperative  Mood Preoccupied  Thought Process  Coherency WDL  Content Preoccupation  Delusions None reported or observed  Perception WDL  Hallucination None reported or observed  Judgment WDL  Confusion None  Danger to Self  Current suicidal ideation? Denies  Danger to Others  Danger to Others None reported or observed

## 2022-02-13 NOTE — Group Note (Signed)
Recreation Therapy Group Note   Group Topic:Personal Development  Group Date: 02/13/2022 Start Time: 1000 End Time: 1035 Facilitators: Caroll Rancher, LRT,CTRS Location: [address] Dayroom   Goal Area(s) Addresses:  Patient will identify how triggers that affect them.  Patient will identify a situations that trigger them.  Patient will identify ways to deal with triggers.   Group Description:  Patients identified what a trigger is, what triggers them, and the way they specifically react when triggered. Patients were given a worksheet to identify problems their triggers are contributing to. Patients were to then identify their 3 biggest triggers, how they avoid those triggers and how they deal with them head on when they can't be avoided.    Affect/Mood: N/A   Participation Level: Did not attend    Clinical Observations/Individualized Feedback:      Plan: Continue to engage patient in RT group sessions 2-3x/week.   Caroll Rancher, LRT,CTRS 02/13/2022 11:16 AM

## 2022-02-13 NOTE — Progress Notes (Addendum)
Billy Rose Progress Note  02/13/2022 7:29 AM Billy Rose  MRN:  [id number] Subjective:   Billy Rose is a 21 y.o. male with psychiatric hx of schizoaffective disorder bipolar type, PTSD, generalized anxiety disorder who presented to [facility] under IVC due to concerns for suicide attempt as well as worsening psychosis for past 6 months.  On assessment today, patient reports to doing well and having no somatic complaints.  Patient persistently denied all symptoms related to mood, psychosis, or physical complaints.  Patient reports that his sleep and appetite have largely been unchanged.  Denies SI/HI/AVH, paranoia, ideas of reference.  Patient reports doing well and has no further questions at this time.  Patient still amenable to Haldol LAI tomorrow.  Excited for discharge.  Patient was agreeable to this plan and had no further questions at this time. Continues to appear more logical and coherent during assessment today.  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 No pertinent surgical history. Family History:  Family History  Problem Relation Age of Onset   Allergic rhinitis Neg Hx    Asthma Neg Hx    Eczema Neg Hx    Urticaria Neg Hx    Family Psychiatric  History: per H and P Social History:  Social History   Substance and Sexual Activity  Alcohol Use Yes   Comment: "4 beers every so often"     Social History   Substance and Sexual Activity  Drug Use Not Currently   Types: Cocaine, LSD,  Marijuana, IV    Social History   Socioeconomic History   Marital status: Single    Spouse name: Not on file   Number of children: Not on file   Years of education: Not on file   Highest education level: Not on file  Occupational History   Not on file  Tobacco Use   Smoking status: Every Day    Packs/day: 1.00    Types: Cigarettes   Smokeless tobacco: Never  Vaping Use   Vaping Use: Never used  Substance and Sexual Activity   Alcohol use: Yes    Comment: "4 beers every so often"   Drug use: Not Currently    Types: Cocaine, LSD, Marijuana, IV   Sexual activity: Not Currently  Other Topics Concern   Not on file  Social History Narrative   ** Merged History Encounter **       Social Determinants of Health   Financial Resource Strain: Not on file  Food Insecurity: Unknown (02/07/2022)   Hunger Vital Sign    Worried About Running Out of Food in the Last Year: Patient refused    Ran Out of Food in the Last Year: Patient refused  Transportation Needs: Unknown (02/07/2022)   PRAPARE - Administrator, Civil Service (Medical): Not on file    Lack of Transportation (Non-Medical): Patient refused  Physical Activity: Not on file  Stress: Not on file  Social Connections: Not on file   Additional Social History:  Sleep: Fair  Appetite:  Fair  Current Medications: Current Facility-Administered Medications  Medication Dose Route Frequency Provider Last Rate Last Admin   acetaminophen (TYLENOL) tablet 650 mg  650 mg Oral Q6H PRN Jackelyn Poling, NP       alum & mag hydroxide-simeth (MAALOX/MYLANTA) 200-200-20 MG/5ML suspension 30 mL  30 mL Oral Q4H PRN Jackelyn Poling, NP       benztropine (COGENTIN) tablet 1 mg  1 mg Oral BID PRN Park Pope, Rose       haloperidol (HALDOL) tablet 5 mg  5 mg Oral Q8H PRN Park Pope, Rose       And   LORazepam (ATIVAN) tablet 2 mg  2 mg Oral Q8H PRN Park Pope, Rose       And   diphenhydrAMINE (BENADRYL)  capsule 25 mg  25 mg Oral Q8H PRN Park Pope, Rose       haloperidol lactate (HALDOL) injection 5 mg  5 mg Intramuscular Q6H PRN Park Pope, Rose       And   LORazepam (ATIVAN) injection 2 mg  2 mg Intramuscular Q6H PRN Park Pope, Rose       And   diphenhydrAMINE (BENADRYL) injection 50 mg  50 mg Intravenous Q6H PRN Park Pope, Rose       feeding supplement (ENSURE ENLIVE / ENSURE PLUS) liquid 237 mL  237 mL Oral BID BM Nkwenti, Doris, NP   237 mL at 02/12/22 1342   FLUoxetine (PROZAC) capsule 10 mg  10 mg Oral Daily Park Pope, Rose   10 mg at 02/12/22 0804   haloperidol (HALDOL) tablet 10 mg  10 mg Oral BID Park Pope, Rose   10 mg at 02/12/22 1723   [START ON 02/14/2022] haloperidol decanoate (HALDOL DECANOATE) 100 MG/ML injection 100 mg  100 mg Intramuscular Q30 days Park Pope, Rose       hydrOXYzine (ATARAX) tablet 25 mg  25 mg Oral TID PRN Park Pope, Rose   25 mg at 02/10/22 2121   magnesium hydroxide (MILK OF MAGNESIA) suspension 30 mL  30 mL Oral Daily PRN Nira Conn A, NP       nicotine (NICODERM CQ - dosed in mg/24 hours) patch 14 mg  14 mg Transdermal Daily Sindy Guadeloupe, NP       traZODone (DESYREL) tablet 50 mg  50 mg Oral QHS PRN Jackelyn Poling, NP   50 mg at 02/10/22 2121    Lab Results:  No results found for this or any previous visit (from the past 48 hour(s)).   Blood Alcohol level:  Lab Results  Component Value Date   ETH <10 02/05/2022   ETH <10 04/16/2021    Metabolic Disorder Labs: Lab Results  Component Value Date   HGBA1C 5.1 02/07/2022   MPG 99.67 02/07/2022   MPG 102.54 10/10/2020   Lab Results  Component Value Date   PROLACTIN 24.1 (H) 05/22/2020   PROLACTIN 32.1 (H) 02/15/2017   Lab Results  Component Value Date   CHOL 128 02/07/2022   TRIG 81 02/07/2022   HDL 51 02/07/2022   CHOLHDL 2.5 02/07/2022   VLDL 16 02/07/2022   LDLCALC 61 02/07/2022   LDLCALC 52 10/10/2020    Physical Findings:   Musculoskeletal: Strength & Muscle Tone: within normal  limits Gait & Station: normal Patient leans: N/A  Psychiatric Specialty Exam:  Presentation  General Appearance: Appropriate for Environment; Casual  Eye Contact:Minimal  Speech:Clear and Coherent; Normal Rate  Speech Volume:Normal  Handedness:Right  Mood and Affect  Mood:Euthymic  Affect: constricted  Thought Process  Thought Processes:Goal Directed; Coherent  Descriptions of Associations:Intact  Orientation:Full (Time, Place and Person)  Thought Content:Logical  History of Schizophrenia/Schizoaffective disorder:No data recorded Duration of Psychotic Symptoms:No data recorded Hallucinations:No data recorded   Ideas of Reference:None  Suicidal Thoughts:No data recorded   Homicidal Thoughts:No data recorded    Sensorium  Memory:Immediate Fair; Recent Fair; Remote Fair  Judgment:Impaired  Insight:Fair   Executive Functions  Concentration:Fair  Attention Span:Fair  Recall:Fair  Fund of Knowledge:Fair  Language:Fair   Psychomotor Activity  Psychomotor Activity:No data recorded    Assets  Assets:Communication Skills; Desire for Improvement; Financial Resources/Insurance; Physical Health   Sleep  Sleep:No data recorded     Physical Exam: Physical Exam Constitutional:      Appearance: the patient is not toxic-appearing.  Pulmonary:     Effort: Pulmonary effort is normal.  Neurological:     General: No focal deficit present.     Mental Status: the patient is alert and oriented to person, place, and time.   Review of Systems  Respiratory:  Negative for shortness of breath.   Cardiovascular:  Negative for chest pain.  Gastrointestinal:  Negative for abdominal pain, constipation, diarrhea, nausea and vomiting.  Neurological:  Negative for headaches.   Blood pressure (!) 105/49, pulse 82, temperature 98.5 F (36.9 C), temperature source Oral, resp. rate 16, height 5\' 11"  (1.803 m), weight 59 kg, SpO2 98 %. Body mass index is 18.13  kg/m.   Treatment Plan Summary: Daily contact with patient to assess and evaluate symptoms and progress in treatment and Medication management  Treatment Plan Summary: Billy Rose is a 21 y.o. male with psychiatric hx of schizoaffective disorder bipolar type, PTSD, generalized anxiety disorder who presented to [facility] under IVC due to concerns for suicide attempt as well as worsening psychosis for past 6 months.   Safety and Monitoring: INVOLUTARILY (safety) admission to inpatient psychiatric unit for safety, stabilization and treatment Daily contact with patient to assess and evaluate symptoms and progress in treatment Appropriate medication management to further stabilize patient Patient's case will be regularly discussed in multi-disciplinary team meeting Observation Level : q15 minute checks Vital signs: q12 hours Precautions: suicide, elopement, and assault   2. Psychiatric Problems Schizoaffective Disorder, Bipolar Type  Medication Noncompliance -Discontinue Risperdal -Continue Haloperidol to 10 mg BID for psychosis --The risks/benefits/side-effects/alternatives to this medication were discussed in detail with the patient and time was given for questions. The patient consents to medication trial.  -Haloperidol Decanoate 100 mg 02/12/22, with 2nd dose to be done 02/14/22 -Will be discharged with : -Haloperidol Decanoate 200 mg monthly injections to begin 03/16/22 -Haloperidol PO 10 mg bid for 14 days as oral bridge             -- Metabolic profile and EKG monitoring obtained while on an atypical antipsychotic (BMI: 18.13 Lipid Panel: WNL HbgA1c: WNL QTc: 449)  -Cogentin 1 mg bid prn for EPS -Continue Prozac 10 mg for depression daily -Agitation protocol added for PO and IM (if refusing PO): ativan, haldol, and benadryl             -- Encouraged patient to participate in unit milieu and in scheduled group therapies              -- Short Term Goals: Ability to identify changes in  lifestyle to reduce recurrence of condition will improve, Ability to verbalize feelings will improve, Ability to disclose and discuss suicidal ideas,  and Ability to demonstrate self-control will improve             -- Long Term Goals: Improvement in symptoms so as ready for discharge   3. Medical Management Labs and Imaging Patient's recent labs were reviewed and pertinent positives and negatives are listed below: CMP: wnl CBC: wnl EtOH:  Recent Labs       Lab Results  Component Value Date    ETH <10 02/05/2022    ETH <10 04/16/2021      UDS: negative TSH: nml A1C: nml Lipids: nml   Medical Problems none   PRN The following PRN medications were added to ensure patient can focus on treatment. These were discussed with patient and patient aware of ability to ask for the following medications:  -Tylenol 650 mg q6hr PRN for mild pain -Mylanta 30 ml suspension for indigestion -Milk of Magnesia 30 ml for constipation -Trazodone 50 mg qhs for insomnia -Hydroxyzine 25 mg tid PRN for anxiety   4. Discharge Planning Patient will require the following based on my assessment:  Greatly appreciate CSW and Case management assistance with facilitating these needs and any further recommendations regarding patient's needs upon discharge. Estimated LOS: 7 days Discharge Concerns: Need to establish a safety plan; Medication compliance and effectiveness Discharge Goals: Return home with outpatient referrals for mental health follow-up including medication management/psychotherapy Per LCSW note, patient has an ACT team that will see him on 02/14/22 11:30 AM for discharge planning and mother will pick patient up thereafter   Park Pope, Rose 02/13/2022, 7:29 AM

## 2022-02-14 MED ORDER — FLUOXETINE HCL 10 MG PO CAPS: 10.0000 mg | ORAL_CAPSULE | Freq: Every day | ORAL | 0 refills | Status: DC

## 2022-02-14 MED ORDER — TRAZODONE HCL 50 MG PO TABS: 50.0000 mg | ORAL_TABLET | Freq: Every evening | ORAL | 0 refills | Status: DC | PRN

## 2022-02-14 MED ORDER — BENZTROPINE MESYLATE 1 MG PO TABS: 1.0000 mg | ORAL_TABLET | Freq: Two times a day (BID) | ORAL | 0 refills | Status: DC | PRN

## 2022-02-14 MED ORDER — HALOPERIDOL DECANOATE 100 MG/ML IM SOLN
200.0000 mg | INTRAMUSCULAR | 0 refills | Status: DC
Start: 2022-03-14 — End: 2022-05-19

## 2022-02-14 MED ORDER — HALOPERIDOL 10 MG PO TABS: 10.0000 mg | ORAL_TABLET | Freq: Two times a day (BID) | ORAL | 0 refills | Status: DC

## 2022-02-14 MED ORDER — HYDROXYZINE HCL 25 MG PO TABS: 25.0000 mg | ORAL_TABLET | Freq: Three times a day (TID) | ORAL | 0 refills | Status: DC | PRN

## 2022-02-14 NOTE — Discharge Summary (Addendum)
Physician Discharge Summary Note  Patient:  Billy Rose is an 21 y.o., male MRN:  [id number] DOB:  07-02-2000 Patient phone:  [phone] (home)  Patient address:   [address] [place] [address],  Total Time spent with patient: 45 minutes  Date of Admission:  02/07/2022 Date of Discharge: 02/14/2022  Reason for Admission:   Billy Rose is a 21 y.o. male with psychiatric hx of schizoaffective disorder bipolar type, PTSD, generalized anxiety disorder who presented to [facility] under IVC due to concerns for suicide attempt as well as worsening psychosis for past 6 months.    Principal Problem: Schizoaffective disorder, bipolar type [facility]) Discharge Diagnoses: Principal Problem:   Schizoaffective disorder, bipolar type (HCC)    Past Psychiatric History:  Previous Psych Diagnoses: psychosis, PTSD (panic, tremors), anxiety, depression, unspecified schizophrenia spectrum disorder Prior inpatient treatment:  yes  Current/prior outpatient treatment: prozac and risperdal. Not really.  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 No pertinent surgical history. Family History:  Family History  Problem Relation Age of Onset   Allergic rhinitis Neg Hx    Asthma Neg Hx    Eczema Neg Hx    Urticaria Neg Hx    Family Psychiatric  History:  Medical: no Psych: bipolar disorder from father,  phobia/postpartum with SA/HA: no Substance use family hx: denies  Social History:  Social History   Substance and Sexual Activity  Alcohol Use Yes   Comment: "4 beers every so often"     Social History   Substance and Sexual Activity  Drug Use Not Currently   Types: Cocaine, LSD, Marijuana, IV    Social History   Socioeconomic History   Marital status: Single    Spouse name: Not on file   Number of children: Not on file   Years of education: Not on file   Highest education level: Not on file  Occupational History   Not on file  Tobacco Use   Smoking status: Every Day    Packs/day: 1.00    Types: Cigarettes   Smokeless tobacco: Never  Vaping Use   Vaping Use: Never used  Substance and Sexual Activity   Alcohol use: Yes    Comment: "4 beers every so often"   Drug use: Not Currently    Types: Cocaine, LSD, Marijuana, IV   Sexual activity: Not Currently  Other Topics Concern   Not on file  Social History Narrative   ** Merged History Encounter **       Social Determinants of Health   Financial Resource Strain: Not on file  Food Insecurity: Unknown (02/07/2022)   Hunger Vital Sign    Worried About Running Out of Food in the Last Year: Patient refused    Ran Out of Food in the Last Year: Patient refused  Transportation Needs: Unknown (02/07/2022)   PRAPARE - Administrator, Civil Service (Medical): Not on file    Lack of Transportation (Non-Medical): Patient refused  Physical Activity: Not on file  Stress: Not  on file  Social Connections: Not on file    Hospital Course:   During the patient's hospitalization, patient had extensive initial psychiatric evaluation, and follow-up psychiatric evaluations every day.   Psychiatric diagnoses provided upon initial assessment:  Schizoaffective Disorder, Bipolar Type    Patient's psychiatric medications were adjusted on admission: -Discontinue Risperdal -Start Haloperidol 5 mg BID -Continue home Prozac 10  mg daily -Cogentin 1 mg bid prn for EPS   During the hospitalization, other adjustments were made to the patient's psychiatric medication regimen:  -Increase Haldol to 10 mg bid for refractory psychosis -Haloperidol Decanoate 100 mg 9/13 and 2nd dose 9/15 -Next Haloperidol Decanoate 200 mg monthly (next dose:03/14/2022) -Continue Cogentin 1 mg bid prn for EPS -Continue Prozac 10 mg daily for depression   Gradually, patient started adjusting to milieu.   Patient's care was discussed during the interdisciplinary team meeting every day during the hospitalization.   The patient denies having side effects to prescribed psychiatric medication.   The patient reports their target psychiatric symptoms of psychosis and depression responded well to the psychiatric medications, and the patient reports overall benefit other psychiatric hospitalization. Supportive psychotherapy was provided to the patient. The patient also participated in regular group therapy while admitted.    Labs were reviewed with the patient, and abnormal results were discussed with the patient.   The patient denied having suicidal thoughts more than 48 hours prior to discharge.  Patient denies having homicidal thoughts.  Patient denies having auditory hallucinations.  Patient denies any visual hallucinations.  Patient denies having paranoid thoughts.   The patient is able to verbalize their individual safety plan to this provider.   It is recommended to the patient to continue psychiatric medications as prescribed, after discharge from the hospital.     It is recommended to the patient to follow up with your outpatient psychiatric provider and PCP.   Discussed with the patient, the impact of alcohol, drugs, tobacco have been there overall psychiatric and medical wellbeing, and total abstinence from substance use was recommended the patient.  Physical Findings: AIMS:  Facial and Oral Movements Muscles of Facial Expression:  None, normal Lips and Perioral Area: None, normal Jaw: None, normal Tongue: None, normal, Extremity Movements Upper (arms, wrists, hands, fingers): None, normal Lower (legs, knees, ankles, toes): None, normal,  Trunk Movements Neck, shoulders, hips: None, normal,  Overall Severity Severity of abnormal movements (highest score from questions above): None, normal Incapacitation due to abnormal movements: None, normal Patient's awareness of abnormal movements (rate only patient's report): No Awareness,  Dental Status Current problems with teeth and/or dentures?: No Does patient usually wear dentures?: No  CIWA:    COWS:     Musculoskeletal: Strength & Muscle Tone: within normal limits Gait & Station: normal Patient leans: N/A   Psychiatric Specialty Exam:  Presentation  General Appearance: Appropriate for Environment; Casual   Eye Contact:Minimal   Speech:Clear and Coherent; Normal Rate   Speech Volume:Normal   Handedness:Right    Mood and Affect  Mood:Euthymic   Affect:constricted    Thought Process  Thought Processes:Goal Directed; Coherent   Descriptions of Associations:Intact   Orientation:Full (Time, Place and Person)   Thought Content:Denied, AVH, paranoia, delusions, ideas of reference, first rank symptoms, and was not grossly responding to internal/external stimuli on exam  Hallucinations:Denied  Ideas of Reference:None   Suicidal Thoughts:Denied  Homicidal Thoughts:Denied   Sensorium  Memory:Immediate Fair; Recent Fair; Remote Fair   Judgment:Fair   Insight:Fair    Art therapist  Concentration:Fair   Attention Span:Fair   Recall:Fair   Fund of Knowledge:Fair   Language:Fair    Psychomotor Activity  Psychomotor Activity:No cogwheeling, no stiffness, no tremor, AIMS 0, no akathisias   Assets  Assets:Communication Skills; Desire for Improvement; Financial Resources/Insurance; Physical  Health   Physical Exam Vitals and nursing note reviewed.  Constitutional:      Appearance: Normal appearance. He is normal weight.  HENT:     Head: Normocephalic and atraumatic.  Pulmonary:     Effort: Pulmonary effort is normal.  Neurological:     General: No focal deficit present.     Mental Status: He is oriented to person, place, and time.    Review of Systems  Respiratory:  Negative for shortness of breath.   Cardiovascular:  Negative for chest pain.  Gastrointestinal:  Negative for abdominal pain, constipation, diarrhea, heartburn, nausea and vomiting.  Neurological:  Negative for headaches.   Blood pressure 110/61, pulse 64, temperature 98.3 F (36.8 C), temperature source Oral, resp. rate 16, height 5\' 11"  (1.803 m), weight 59 kg, SpO2 98 %. Body mass index is 18.13 kg/m.   Social History   Tobacco Use  Smoking Status Every Day   Packs/day: 1.00   Types: Cigarettes  Smokeless Tobacco Never   Tobacco Cessation:  A prescription for an FDA-approved tobacco cessation medication was offered at discharge and the patient refused   Blood Alcohol level:  Lab Results  Component Value Date   [facility] <10 02/05/2022   ETH <10 04/16/2021    Metabolic Disorder Labs:  Lab Results  Component Value Date   HGBA1C 5.1 02/07/2022   MPG 99.67 02/07/2022   MPG 102.54 10/10/2020   Lab Results  Component Value Date   PROLACTIN 24.1 (H) 05/22/2020   PROLACTIN 32.1 (H) 02/15/2017   Lab Results  Component Value Date   CHOL 128 02/07/2022   TRIG 81 02/07/2022   HDL 51 02/07/2022   CHOLHDL 2.5 02/07/2022   VLDL 16 02/07/2022   LDLCALC 61 02/07/2022   LDLCALC 52 10/10/2020    See Psychiatric Specialty Exam and Suicide Risk Assessment completed by Attending Physician prior to discharge.  Discharge destination:  Home  Is patient on multiple antipsychotic therapies at discharge:  No   Has Patient had three or more failed trials of antipsychotic monotherapy by history:   No  Recommended Plan for Multiple Antipsychotic Therapies: NA  Discharge Instructions     Call MD for:  difficulty breathing, headache or visual disturbances   Complete by: As directed    Call MD for:  persistant dizziness or light-headedness   Complete by: As directed    Call MD for:  persistant nausea and vomiting   Complete by: As directed    Call MD for:  redness, tenderness, or signs of infection (pain, swelling, redness, odor or green/yellow discharge around incision site)   Complete by: As directed    Call MD for:  severe uncontrolled pain   Complete by: As directed    Diet - low sodium heart healthy   Complete by: As directed    Increase activity slowly   Complete by: As directed       Allergies as of 02/14/2022       Reactions   Penicillins Anaphylaxis, Swelling, Other (See Comments)   Did it involve swelling of the face/tongue/throat, SOB, or low BP? Yes Did it involve sudden or severe rash/hives, skin peeling, or any reaction on the inside of your mouth or nose? Unk  Did you need to seek medical attention at a hospital or doctor's office? Unk When did it last happen? Childhood If all above answers are "NO", may proceed with cephalosporin use.   Sulfa Antibiotics Anaphylaxis, Shortness Of Breath, Swelling, Rash, Other (See Comments)   Throat and face swell   Abilify [aripiprazole] Other (See Comments)   Worsened the patient        Medication List     STOP taking these medications    risperiDONE 3 MG tablet Commonly known as: RISPERDAL       TAKE these medications      Indication  benztropine 1 MG tablet Commonly known as: COGENTIN Take 1 tablet (1 mg total) by mouth 2 (two) times daily as needed for tremors.  Indication: Extrapyramidal Reaction caused by Medications   FLUoxetine 10 MG capsule Commonly known as: PROZAC Take 1 capsule (10 mg total) by mouth daily.  Indication: Depression   haloperidol 10 MG tablet Commonly known as: HALDOL Take  1 tablet (10 mg total) by mouth 2 (two) times daily. Notes to patient: 8 am dose given  Indication: Psychosis   haloperidol decanoate 100 MG/ML injection Commonly known as: HALDOL DECANOATE Inject 2 mLs (200 mg total) into the muscle every 28 (twenty-eight) days. Start taking on: March 14, 2022  Indication: Schizoaffective Disorder, Bipolar Type   hydrOXYzine 25 MG tablet Commonly known as: ATARAX Take 1 tablet (25 mg total) by mouth 3 (three) times daily as needed for anxiety.  Indication: Feeling Anxious   traZODone 50 MG tablet Commonly known as: DESYREL Take 1 tablet (50 mg total) by mouth at bedtime as needed for sleep.  Indication: Trouble Sleeping        Follow-up Information     [facility] Follow up.   Why: Your ACT Team will be following up with you at your home after discharge, Contact information: [address]  [address] [place] [address] [phone]                 Follow-up recommendations:   Activity:  as tolerated Diet:  heart healthy   Comments:  Prescriptions were given at discharge.  Patient is agreeable with the discharge plan.  Patient was given an opportunity to ask questions.  Patient appears to feel comfortable with discharge and denies any current suicidal or homicidal thoughts.    Patient is instructed prior to discharge to: Take all medications as prescribed by mental healthcare provider. Report any adverse effects and or reactions from the medicines to outpatient provider promptly. In the event of worsening symptoms, patient is instructed to call the crisis hotline, 911 and or go to the nearest ED for appropriate evaluation and treatment of symptoms. Patient is to follow-up with primary care provider for other medical issues, concerns and or health care needs.   Signed: Park Pope, MD PGY2 02/14/2022, 1:46 PM

## 2022-02-14 NOTE — Progress Notes (Signed)
Recreation Therapy Notes  INPATIENT RECREATION TR PLAN  Patient Details Name: Winslow Ederer MRN: [id number] DOB: 2000/07/12 Today's Date: 02/14/2022  Rec Therapy Plan Is patient appropriate for Therapeutic Recreation?: Yes Treatment times per week: about 3 days Estimated Length of Stay: 5-7 days TR Treatment/Interventions: Group participation (Comment)  Discharge Criteria Pt will be discharged from therapy if:: Discharged Treatment plan/goals/alternatives discussed and agreed upon by:: Patient/family  Discharge Summary Short term goals set: See patient care plan Short term goals met: Not met Reason goals not met: Pt did not attend group sessions. Therapeutic equipment acquired: N/A Reason patient discharged from therapy: Discharge from hospital Pt/family agrees with progress & goals achieved: Yes Date patient discharged from therapy: 02/14/22   Victorino Sparrow, Vickki Muff, Julies Carmickle A 02/14/2022, 1:47 PM

## 2022-02-14 NOTE — Progress Notes (Signed)
Discharge Note:  Patient denies SI/HI/AVH at this time. Discharge instructions, AVS, prescriptions, and transition recor gone over with patient. Patient agrees to comply with medication management, follow-up visit, and outpatient therapy. Patient belongings returned to patient. Patient questions and concerns addressed and answered. Patient ambulatory off unit. Patient discharged to home.  

## 2022-02-14 NOTE — H&P (Deleted)
[facility] Discharge Suicide Risk Assessment   Principal Problem: Schizoaffective disorder, bipolar type [facility]) Discharge Diagnoses: Principal Problem:   Schizoaffective disorder, bipolar type (HCC) Active Problems:   Affective psychosis, bipolar (HCC)   Reason for Admission: Billy Rose is a 21 y.o. male with psychiatric hx of schizoaffective disorder bipolar type, PTSD, generalized anxiety disorder who presented to [facility] under IVC due to concerns for suicide attempt as well as worsening psychosis for past 6 months.  Hospital Summary During the patient's hospitalization, patient had extensive initial psychiatric evaluation, and follow-up psychiatric evaluations every day.  Psychiatric diagnoses provided upon initial assessment:  Schizoaffective Disorder, Bipolar Type   Patient's psychiatric medications were adjusted on admission: -Discontinue Risperdal -Start Haloperidol 5 mg BID -Continue home Prozac 10 mg daily -Cogentin 1 mg bid prn for EPS  During the hospitalization, other adjustments were made to the patient's psychiatric medication regimen:  -Increase Haldol to 10 mg bid for refractory psychosis -Haloperidol Decanoate 100 mg 9/13 and 2nd dose 9/15 -Next Haloperidol Decanoate 200 mg monthly (next dose:03/14/2022) -Continue Cogentin 1 mg bid prn for EPS -Continue Prozac 10 mg daily for depression  Gradually, patient started adjusting to milieu.   Patient's care was discussed during the interdisciplinary team meeting every day during the hospitalization.  The patient denies having side effects to prescribed psychiatric medication.  The patient reports their target psychiatric symptoms of psychosis and depression responded well to the psychiatric medications, and the patient reports overall benefit other psychiatric hospitalization. Supportive psychotherapy was provided to the patient. The patient also participated in regular group therapy while admitted.   Labs were reviewed with the  patient, and abnormal results were discussed with the patient.  The patient denied having suicidal thoughts more than 48 hours prior to discharge.  Patient denies having homicidal thoughts.  Patient denies having auditory hallucinations.  Patient denies any visual hallucinations.  Patient denies having paranoid thoughts.  The patient is able to verbalize their individual safety plan to this provider.  It is recommended to the patient to continue psychiatric medications as prescribed, after discharge from the hospital.    It is recommended to the patient to follow up with your outpatient psychiatric provider and PCP.  Discussed with the patient, the impact of alcohol, drugs, tobacco have been there overall psychiatric and medical wellbeing, and total abstinence from substance use was recommended the patient.   Total Time spent with patient: 45 minutes  Musculoskeletal: Strength & Muscle Tone: within normal limits Gait & Station: normal Patient leans: N/A  Psychiatric Specialty Exam  Presentation  General Appearance: Appropriate for Environment; Casual   Eye Contact:Minimal   Speech:Clear and Coherent; Normal Rate   Speech Volume:Normal   Handedness:Right    Mood and Affect  Mood:Euthymic   Duration of Depression Symptoms: Greater than two weeks   Affect:Flat    Thought Process  Thought Processes:Goal Directed; Coherent   Descriptions of Associations:Intact   Orientation:Full (Time, Place and Person)   Thought Content:Logical   History of Schizophrenia/Schizoaffective disorder:No data recorded  Duration of Psychotic Symptoms:No data recorded  Hallucinations:No data recorded Ideas of Reference:None   Suicidal Thoughts:No data recorded Homicidal Thoughts:No data recorded  Sensorium  Memory:Immediate Fair; Recent Fair; Remote Fair   Judgment:Impaired   Insight:Fair    Executive Functions  Concentration:Fair   Attention  Span:Fair   Recall:Fair   Fund of Knowledge:Fair   Language:Fair    Psychomotor Activity  Psychomotor Activity:No data recorded  Assets  Assets:Communication Skills; Desire for Improvement; Financial Resources/Insurance;  Physical Health    Sleep  Sleep:No data recorded  Physical Exam: Physical Exam Vitals and nursing note reviewed.  Constitutional:      Appearance: Normal appearance. He is normal weight.  HENT:     Head: Normocephalic and atraumatic.  Pulmonary:     Effort: Pulmonary effort is normal.  Neurological:     General: No focal deficit present.     Mental Status: He is oriented to person, place, and time.    Review of Systems  Respiratory:  Negative for shortness of breath.   Cardiovascular:  Negative for chest pain.  Gastrointestinal:  Negative for abdominal pain, constipation, diarrhea, heartburn, nausea and vomiting.  Neurological:  Negative for headaches.   Blood pressure 110/61, pulse 64, temperature 98.3 F (36.8 C), temperature source Oral, resp. rate 16, height 5\' 11"  (1.803 m), weight 59 kg, SpO2 98 %. Body mass index is 18.13 kg/m.  Mental Status Per Nursing Assessment::   On Admission:  Self-harm behaviors  Demographic Factors:  Adolescent or young adult, Caucasian, Low socioeconomic status, and Unemployed  Loss Factors: Financial problems/change in socioeconomic status  Historical Factors: Impulsivity  Risk Reduction Factors:   Positive social support, Positive therapeutic relationship, and Positive coping skills or problem solving skills  Continued Clinical Symptoms:  Previous Psychiatric Diagnoses and Treatments  Cognitive Features That Contribute To Risk:  None    Suicide Risk:  Mild:  Suicidal ideation of limited frequency, intensity, duration, and specificity.  There are no identifiable plans, no associated intent, mild dysphoria and related symptoms, good self-control (both objective and subjective assessment), few  other risk factors, and identifiable protective factors, including available and accessible social support.   Follow-up Information     [facility] Follow up.   Contact information: [address]  [address] [place] [phone]                 Plan Of Care/Follow-up recommendations:  Activity: as tolerated  Diet: heart healthy  Other: -Follow-up with your outpatient psychiatric provider -instructions on appointment date, time, and address (location) are provided to you in discharge paperwork.  -Take your psychiatric medications as prescribed at discharge - instructions are provided to you in the discharge paperwork  -Follow-up with outpatient primary care doctor and other specialists -for management of chronic medical disease, including: none  -Testing: Follow-up with outpatient provider for abnormal lab results: none  -Recommend abstinence from alcohol, tobacco, and other illicit drug use at discharge.   -If your psychiatric symptoms recur, worsen, or if you have side effects to your psychiatric medications, call your outpatient psychiatric provider, 911, [phone] or go to the nearest emergency department.  -If suicidal thoughts recur, call your outpatient psychiatric provider, 911, [phone] or go to the nearest emergency department.   [phone], MD 02/14/2022, 8:52 AM

## 2022-02-14 NOTE — Group Note (Addendum)
LCSW Group Therapy Note   Group Date: 02/14/2022 Start Time: 1100 End Time: 1200  Type of Therapy/Topic:  Group Therapy:  Balance in Life  Participation Level:  Active  Description of Group:    This group will address the concept of balance and how it feels and looks when one is unbalanced. Patients will be encouraged to process areas in their lives that are out of balance and identify reasons for remaining unbalanced. Facilitators will guide patients in utilizing problem-solving interventions to address and correct the stressor making their life unbalanced. Understanding and applying boundaries will be explored and addressed for obtaining and maintaining a balanced life. Patients will be encouraged to explore ways to assertively make their unbalanced needs known to significant others in their lives, using other group members and facilitator for support and feedback.  Therapeutic Goals: Patient will identify two or more emotions or situations they have that consume much of in their lives. Patient will identify two ways to set boundaries in order to achieve balance in their lives:   Summary of Patient Progress:  Due to the acuity and complex discharge plans, group was not held. Patient was provided therapeutic worksheets and asked to meet with CSW as needed.   Therapeutic Modalities:   Cognitive Behavioral Therapy Solution-Focused Therapy Assertiveness Training  Ethelmae Ringel M Ryleigh Buenger, LCSWA 02/14/2022  11:42 AM    

## 2022-02-14 NOTE — BHH Suicide Risk Assessment (Addendum)
[facility] Discharge Suicide Risk Assessment   Principal Problem: Schizoaffective disorder, bipolar type [facility]) Discharge Diagnoses: Principal Problem:   Schizoaffective disorder, bipolar type (HCC) Active Problems:   Affective psychosis, bipolar (HCC)   Reason for Admission: Billy Rose is a 21 y.o. male with psychiatric hx of schizoaffective disorder bipolar type, PTSD, generalized anxiety disorder who presented to [facility] under IVC due to concerns for suicide attempt as well as worsening psychosis for past 6 months.  Hospital Summary During the patient's hospitalization, patient had extensive initial psychiatric evaluation, and follow-up psychiatric evaluations every day.  Psychiatric diagnoses provided upon initial assessment:  Schizoaffective Disorder, Bipolar Type   Patient's psychiatric medications were adjusted on admission: -Discontinue Risperdal -Start Haloperidol 5 mg BID -Continue home Prozac 10 mg daily -Cogentin 1 mg bid prn for EPS  During the hospitalization, other adjustments were made to the patient's psychiatric medication regimen:  -Increase Haldol to 10 mg bid for refractory psychosis -Haloperidol Decanoate 100 mg 9/13 and 2nd dose 9/15 -Next Haloperidol Decanoate 200 mg monthly (next dose:03/14/2022) -Continue Cogentin 1 mg bid prn for EPS -Continue Prozac 10 mg daily for depression  Gradually, patient started adjusting to milieu.   Patient's care was discussed during the interdisciplinary team meeting every day during the hospitalization.  The patient denies having side effects to prescribed psychiatric medication.  The patient reports their target psychiatric symptoms of psychosis and depression responded well to the psychiatric medications, and the patient reports overall benefit other psychiatric hospitalization. Supportive psychotherapy was provided to the patient. The patient also participated in regular group therapy while admitted.   Labs were reviewed with the  patient, and abnormal results were discussed with the patient.  The patient denied having suicidal thoughts more than 48 hours prior to discharge.  Patient denies having homicidal thoughts.  Patient denies having auditory hallucinations.  Patient denies any visual hallucinations.  Patient denies having paranoid thoughts.  The patient is able to verbalize their individual safety plan to this provider.  It is recommended to the patient to continue psychiatric medications as prescribed, after discharge from the hospital.    It is recommended to the patient to follow up with your outpatient psychiatric provider and PCP.  Discussed with the patient, the impact of alcohol, drugs, tobacco have been there overall psychiatric and medical wellbeing, and total abstinence from substance use was recommended the patient.   Total Time spent with patient: 45 minutes  Musculoskeletal: Strength & Muscle Tone: within normal limits Gait & Station: normal Patient leans: N/A  Psychiatric Specialty Exam  Presentation  General Appearance: Appropriate for Environment; Casual   Eye Contact:Minimal   Speech:Clear and Coherent; Normal Rate   Speech Volume:Normal   Handedness:Right    Mood and Affect  Mood:Euthymic   Duration of Depression Symptoms: Greater than two weeks   Affect:Flat    Thought Process  Thought Processes:Goal Directed; Coherent   Descriptions of Associations:Intact   Orientation:Full (Time, Place and Person)   Thought Content:Logical   History of Schizophrenia/Schizoaffective disorder:No data recorded  Duration of Psychotic Symptoms:No data recorded  Hallucinations:No data recorded Ideas of Reference:None   Suicidal Thoughts:No data recorded Homicidal Thoughts:No data recorded  Sensorium  Memory:Immediate Fair; Recent Fair; Remote Fair   Judgment:Impaired   Insight:Fair    Executive Functions  Concentration:Fair   Attention  Span:Fair   Recall:Fair   Fund of Knowledge:Fair   Language:Fair    Psychomotor Activity  Psychomotor Activity:No data recorded  Assets  Assets:Communication Skills; Desire for Improvement; Financial Resources/Insurance;  Physical Health    Sleep  Sleep:No data recorded  Physical Exam: Physical Exam Vitals and nursing note reviewed.  Constitutional:      Appearance: Normal appearance. He is normal weight.  HENT:     Head: Normocephalic and atraumatic.  Pulmonary:     Effort: Pulmonary effort is normal.  Neurological:     General: No focal deficit present.     Mental Status: He is oriented to person, place, and time.    Review of Systems  Respiratory:  Negative for shortness of breath.   Cardiovascular:  Negative for chest pain.  Gastrointestinal:  Negative for abdominal pain, constipation, diarrhea, heartburn, nausea and vomiting.  Neurological:  Negative for headaches.   Blood pressure 110/61, pulse 64, temperature 98.3 F (36.8 C), temperature source Oral, resp. rate 16, height 5\' 11"  (1.803 m), weight 59 kg, SpO2 98 %. Body mass index is 18.13 kg/m.  Mental Status Per Nursing Assessment::   On Admission:  Self-harm behaviors  Demographic Factors:  Adolescent or young adult, Caucasian, Low socioeconomic status, and Unemployed  Loss Factors: Financial problems/change in socioeconomic status  Historical Factors: Impulsivity  Risk Reduction Factors:   Positive social support, Positive therapeutic relationship, and Positive coping skills or problem solving skills  Continued Clinical Symptoms:  Previous Psychiatric Diagnoses and Treatments  Cognitive Features That Contribute To Risk:  None    Suicide Risk:  Mild: There are no identifiable plans, no associated intent, mild dysphoria and related symptoms, good self-control on the unit, few other risk factors, and identifiable protective factors, including available and accessible social support.    Follow-up Information     [facility] Follow up.   Contact information: [address]  [address] [place] [phone]                 Plan Of Care/Follow-up recommendations:  Activity: as tolerated  Diet: heart healthy  Other: -Follow-up with your outpatient psychiatric provider -instructions on appointment date, time, and address (location) are provided to you in discharge paperwork.  -Take your psychiatric medications as prescribed at discharge - instructions are provided to you in the discharge paperwork  -Follow-up with outpatient primary care doctor and other specialists -for management of chronic medical disease, including: none  -Testing: Follow-up with outpatient provider for abnormal lab results: none  -Recommend abstinence from alcohol, tobacco, and other illicit drug use at discharge.   -If your psychiatric symptoms recur, worsen, or if you have side effects to your psychiatric medications, call your outpatient psychiatric provider, 911, [phone] or go to the nearest emergency department.  -If suicidal thoughts recur, call your outpatient psychiatric provider, 911, [phone] or go to the nearest emergency department.   [phone], MD 02/14/2022, 8:52 AM

## 2022-02-14 NOTE — Progress Notes (Signed)
  [facility] Adult Case Management Discharge Plan :  Will you be returning to the same living situation after discharge:  Yes,  Home  At discharge, do you have transportation home?: Yes,  Mother and ACT  Do you have the ability to pay for your medications: Yes,  Medicaid   Release of information consent forms completed and in the chart;  Patient's signature needed at discharge.  Patient to Follow up at:  Follow-up Information     [facility] Follow up.   Why: Your ACT Team will be following up with you at your home after discharge, Contact information: [address]  [address] [place] [address] [phone]                 Next level of care provider has access to [facility] Link:no  Safety Planning and Suicide Prevention discussed: Yes,  with patient  and mother      Has patient been referred to the Quitline?: Patient refused referral  Patient has been referred for addiction treatment: N/A  [name], LCSWA 02/14/2022, 9:49 AM

## 2022-02-14 NOTE — Progress Notes (Signed)
   02/14/22 0530  Sleep  Number of Hours 7.5    

## 2022-02-14 NOTE — Plan of Care (Signed)
  Problem: Education: Goal: Emotional status will improve Outcome: Progressing Goal: Mental status will improve Outcome: Progressing   

## 2022-02-14 NOTE — Plan of Care (Signed)
Patient did not attend recreational therapy group sessions.   Caroll Rancher, LRT,CTRS

## 2022-02-24 ENCOUNTER — Inpatient Hospital Stay (HOSPITAL_COMMUNITY)
Admission: RE | Admit: 2022-02-24 | Discharge: 2022-03-02 | DRG: 885 | Disposition: A | Discharge status: Home / Self Care | Payer: Medicaid Other | Attending: Psychiatry | Admitting: Psychiatry

## 2022-02-24 ENCOUNTER — Other Ambulatory Visit: Payer: Self-pay

## 2022-02-24 ENCOUNTER — Encounter (HOSPITAL_COMMUNITY): Payer: Self-pay | Admitting: Psychiatry

## 2022-02-24 DIAGNOSIS — Z79899 Other long term (current) drug therapy: Secondary | ICD-10-CM

## 2022-02-24 DIAGNOSIS — G4700 Insomnia, unspecified: Secondary | ICD-10-CM | POA: Diagnosis not present

## 2022-02-24 DIAGNOSIS — F4310 Post-traumatic stress disorder, unspecified: Secondary | ICD-10-CM | POA: Diagnosis not present

## 2022-02-24 DIAGNOSIS — R45851 Suicidal ideations: Secondary | ICD-10-CM | POA: Diagnosis not present

## 2022-02-24 DIAGNOSIS — Z91199 Patient's noncompliance with other medical treatment and regimen due to unspecified reason: Secondary | ICD-10-CM | POA: Diagnosis not present

## 2022-02-24 DIAGNOSIS — F17210 Nicotine dependence, cigarettes, uncomplicated: Secondary | ICD-10-CM | POA: Diagnosis present

## 2022-02-24 DIAGNOSIS — K30 Functional dyspepsia: Secondary | ICD-10-CM | POA: Diagnosis present

## 2022-02-24 DIAGNOSIS — K5900 Constipation, unspecified: Secondary | ICD-10-CM | POA: Diagnosis present

## 2022-02-24 DIAGNOSIS — Z880 Allergy status to penicillin: Secondary | ICD-10-CM | POA: Diagnosis not present

## 2022-02-24 DIAGNOSIS — Z20822 Contact with and (suspected) exposure to covid-19: Secondary | ICD-10-CM | POA: Diagnosis not present

## 2022-02-24 DIAGNOSIS — F411 Generalized anxiety disorder: Secondary | ICD-10-CM | POA: Diagnosis not present

## 2022-02-24 DIAGNOSIS — F250 Schizoaffective disorder, bipolar type: Principal | ICD-10-CM | POA: Diagnosis present

## 2022-02-24 LAB — SARS CORONAVIRUS 2 BY RT PCR: SARS Coronavirus 2 by RT PCR: NEGATIVE

## 2022-02-24 MED ORDER — HYDROXYZINE HCL 25 MG PO TABS
25.0000 mg | ORAL_TABLET | Freq: Four times a day (QID) | ORAL | Status: DC | PRN
  Administered 2022-02-24: 25 mg via ORAL
  Filled 2022-02-24: qty 1

## 2022-02-24 MED ORDER — HALOPERIDOL 5 MG PO TABS
10.0000 mg | ORAL_TABLET | Freq: Two times a day (BID) | ORAL | Status: DC
  Administered 2022-02-25: 10 mg via ORAL
  Filled 2022-02-24 (×4): qty 2

## 2022-02-24 MED ORDER — MAGNESIUM HYDROXIDE 400 MG/5ML PO SUSP: 30.0000 mL | Freq: Every day | ORAL | Status: DC | PRN

## 2022-02-24 MED ORDER — BENZTROPINE MESYLATE 1 MG PO TABS: 1.0000 mg | ORAL_TABLET | Freq: Two times a day (BID) | ORAL | Status: DC | PRN

## 2022-02-24 MED ORDER — NICOTINE 14 MG/24HR TD PT24
14.0000 mg | MEDICATED_PATCH | Freq: Every day | TRANSDERMAL | Status: DC
  Filled 2022-02-24 (×3): qty 1

## 2022-02-24 MED ORDER — TRAZODONE HCL 50 MG PO TABS
50.0000 mg | ORAL_TABLET | Freq: Every evening | ORAL | Status: DC | PRN
  Administered 2022-02-24 – 2022-02-28 (×4): 50 mg via ORAL
  Filled 2022-02-24 (×4): qty 1

## 2022-02-24 MED ORDER — ALUM & MAG HYDROXIDE-SIMETH 200-200-20 MG/5ML PO SUSP: 30.0000 mL | ORAL | Status: DC | PRN

## 2022-02-24 MED ORDER — FLUOXETINE HCL 10 MG PO CAPS
10.0000 mg | ORAL_CAPSULE | Freq: Every day | ORAL | Status: DC
  Administered 2022-02-25: 10 mg via ORAL
  Filled 2022-02-24 (×2): qty 1

## 2022-02-24 NOTE — Tx Team (Signed)
Initial Treatment Plan 02/24/2022 7:52 PM Jahel Kister XNA:[id number]    PATIENT STRESSORS: Ability for insight  Average or above average intelligence  Capable of independent living  Communication skills  General fund of knowledge  Motivation for treatment/growth  Physical Health  Supportive family/friends    PATIENT STRENGTHS: Ability for insight  Average or above average intelligence  Capable of independent living  Marketing executive fund of knowledge  Motivation for treatment/growth  Physical Health  Supportive family/friends    PATIENT IDENTIFIED PROBLEMS: "Anxiety"  "Depression"  "Substance abuse"                 DISCHARGE CRITERIA:  Ability to meet basic life and health needs Adequate post-discharge living arrangements Improved stabilization in mood, thinking, and/or behavior Medical problems require only outpatient monitoring Motivation to continue treatment in a less acute level of care Need for constant or close observation no longer present Reduction of life-threatening or endangering symptoms to within safe limits Safe-care adequate arrangements made Verbal commitment to aftercare and medication compliance Withdrawal symptoms are absent or subacute and managed without 24-hour nursing intervention  PRELIMINARY DISCHARGE PLAN: Attend aftercare/continuing care group Attend PHP/IOP Attend 12-step recovery group Outpatient therapy Return to previous living arrangement  PATIENT/FAMILY INVOLVEMENT: This treatment plan has been presented to and reviewed with the patient, Ireoluwa Stafford"anxiety".  The patient and family have been given the opportunity to ask questions and make suggestions.  Grayland Ormond Siren, RN 02/24/2022, 7:52 PM

## 2022-02-24 NOTE — Plan of Care (Signed)
Nurse discussed anxiety, depression and coping skills with patient.  

## 2022-02-24 NOTE — Progress Notes (Signed)
Patient is walk in brought by mom to [facility].  Patient recently discharged from [facility].  Has been taking his medications as prescribed by [facility] MD.  Stated his movements have been slow.  Felt "out of my head, could not think right, no sleep, did not eat much, could not function."  Drinks 40 oz beer every other day.  No one to bully him, no brothers or sister.  Never married, no children,  high school diploma, no job, not attending school at this time.  Lives in [place] apartment by himself.  Has been taking his haldol and prozac.  Old healed cuts on L arm.  Anxiety 8, depression 7, hopeless 4.  Denied SI and HI, contracts for safety.  Denied A/V hallucinations.  Kept all his discharge appointments.  Large amount of anxiety, high as a mountain.   Fall risk information given, low fall risk. Patient given food/drink, oriented to [address] hall.   Gwen, MHT helped with skin assessment.

## 2022-02-24 NOTE — H&P (Signed)
Behavioral Health Medical Screening Exam  Billy Rose is an 21 y.o. male who was brought as a walk in by mother. Mother reports he called her reporting depression and suicidal thoughts. His mother was concerned that patient texted her to come get his cat. Patient appears depressed. Admits to "drinking a half of a 40 and taking two haldol a few hours ago. I have been drinking a 40 daily to help with my anxiety. I'm not sure why I drank and took the medication." His mother is worried about psychosis as he carried a Bible with him today, concerned that he called her seeking help for probable suicidal ideation.  He has a history of overdosing on prozac and being hospitalized last year at [facility]. History per notes of severe psychosis. Reports that he has been compliant with his medications since discharge from [facility]. Patient reporting high levels of anxiety and depression. He was recently discharged from [facility] early September 2023 and does have scheduled follow up at [facility] 03/07/22.   Total Time spent with patient: 20 minutes  Psychiatric Specialty Exam: Physical Exam HENT:     Head: Normocephalic.  Cardiovascular:     Rate and Rhythm: Normal rate.     Pulses: Normal pulses.     Heart sounds: Normal heart sounds.  Pulmonary:     Effort: Pulmonary effort is normal.     Breath sounds: Normal breath sounds.  Abdominal:     General: Abdomen is flat.     Palpations: Abdomen is soft.  Musculoskeletal:        General: Normal range of motion.  Skin:    General: Skin is warm and dry.  Neurological:     Mental Status: He is alert and oriented to person, place, and time. Mental status is at baseline.    Review of Systems Blood pressure 122/83, temperature 98.4 F (36.9 C), temperature source Oral, resp. rate 18, SpO2 99 %.There is no height or weight on file to calculate BMI. General Appearance: Disheveled Eye Contact:  Fair Speech:  Clear and Coherent Volume:  Normal Mood:  Anxious and  Dysphoric Affect:  Flat Thought Process:  Coherent Orientation:  Full (Time, Place, and Person) Thought Content:  Logical Suicidal Thoughts:  Yes.  with intent/plan Homicidal Thoughts:  No Memory:  Immediate;   Fair Recent;   Fair Remote;   Fair Judgement:  Poor Insight:  Shallow Psychomotor Activity:  Restlessness Concentration: Concentration: Fair and Attention Span: Fair Recall:  Good Fund of Knowledge:Fair Language: Good Akathisia:  No Handed:  Right AIMS (if indicated):    Assets:  Agricultural consultant Housing Intimacy Leisure Time Physical Health Resilience Social Support Sleep:     Musculoskeletal: Strength & Muscle Tone: within normal limits Gait & Station: normal Patient leans: N/A  Blood pressure 122/83, temperature 98.4 F (36.9 C), temperature source Oral, resp. rate 18, SpO2 99 %.  [place] Scale:  Flowsheet Row Admission (Discharged) from 02/07/2022 in [facility] ED from 02/05/2022 in [facility] DEPT ED to Hosp-Admission (Discharged) from 04/16/2021 in [facility]/STEPDOWN  C-SSRS RISK CATEGORY Low Risk High Risk High Risk       Recommendations: Based on my evaluation the patient does not appear to have an emergency medical condition.  Patient requires inpatient psychiatric admission to suspected suicidal ideation and also mother reports patient expressed suicidal ideation to her today.   Elmarie Shiley, NP 02/24/2022, 2:19 PM

## 2022-02-24 NOTE — BHH Group Notes (Signed)
Pt did not attend A/A 

## 2022-02-25 MED ORDER — ZIPRASIDONE MESYLATE 20 MG IM SOLR: 20.0000 mg | INTRAMUSCULAR | Status: DC | PRN

## 2022-02-25 MED ORDER — VITAMIN B-1 100 MG PO TABS
100.0000 mg | ORAL_TABLET | Freq: Every day | ORAL | Status: DC
  Administered 2022-02-26 – 2022-03-02 (×5): 100 mg via ORAL
  Filled 2022-02-25 (×7): qty 1

## 2022-02-25 MED ORDER — HYDROXYZINE HCL 25 MG PO TABS
25.0000 mg | ORAL_TABLET | Freq: Four times a day (QID) | ORAL | Status: AC | PRN
  Administered 2022-02-26 – 2022-02-27 (×2): 25 mg via ORAL
  Filled 2022-02-25 (×3): qty 1

## 2022-02-25 MED ORDER — LORAZEPAM 1 MG PO TABS: 1.0000 mg | ORAL_TABLET | ORAL | Status: DC | PRN

## 2022-02-25 MED ORDER — ONDANSETRON 4 MG PO TBDP: 4.0000 mg | ORAL_TABLET | Freq: Four times a day (QID) | ORAL | Status: AC | PRN

## 2022-02-25 MED ORDER — OLANZAPINE 5 MG PO TBDP: 5.0000 mg | ORAL_TABLET | Freq: Three times a day (TID) | ORAL | Status: DC | PRN

## 2022-02-25 MED ORDER — HALOPERIDOL 5 MG PO TABS
10.0000 mg | ORAL_TABLET | Freq: Two times a day (BID) | ORAL | Status: DC
  Administered 2022-02-25 – 2022-03-02 (×10): 10 mg via ORAL
  Filled 2022-02-25 (×14): qty 2

## 2022-02-25 MED ORDER — FLUOXETINE HCL 20 MG PO CAPS
20.0000 mg | ORAL_CAPSULE | Freq: Every day | ORAL | Status: DC
  Administered 2022-02-26 – 2022-03-02 (×5): 20 mg via ORAL
  Filled 2022-02-25 (×7): qty 1

## 2022-02-25 MED ORDER — BENZTROPINE MESYLATE 0.5 MG PO TABS
0.5000 mg | ORAL_TABLET | Freq: Two times a day (BID) | ORAL | Status: DC
  Administered 2022-02-25 – 2022-03-02 (×10): 0.5 mg via ORAL
  Filled 2022-02-25 (×14): qty 1

## 2022-02-25 MED ORDER — LORAZEPAM 1 MG PO TABS: 1.0000 mg | ORAL_TABLET | Freq: Four times a day (QID) | ORAL | Status: AC | PRN

## 2022-02-25 MED ORDER — GABAPENTIN 100 MG PO CAPS
100.0000 mg | ORAL_CAPSULE | Freq: Three times a day (TID) | ORAL | Status: DC
  Administered 2022-02-25 – 2022-03-02 (×16): 100 mg via ORAL
  Filled 2022-02-25 (×22): qty 1

## 2022-02-25 MED ORDER — ADULT MULTIVITAMIN W/MINERALS CH
1.0000 | ORAL_TABLET | Freq: Every day | ORAL | Status: DC
  Administered 2022-02-25 – 2022-03-02 (×6): 1 via ORAL
  Filled 2022-02-25 (×9): qty 1

## 2022-02-25 MED ORDER — LOPERAMIDE HCL 2 MG PO CAPS: 2.0000 mg | ORAL_CAPSULE | ORAL | Status: AC | PRN

## 2022-02-25 MED ORDER — BENZTROPINE MESYLATE 0.5 MG PO TABS: 0.5000 mg | ORAL_TABLET | Freq: Two times a day (BID) | ORAL | Status: DC

## 2022-02-25 NOTE — BHH Suicide Risk Assessment (Addendum)
[facility] Admission Suicide Risk Assessment   Nursing information obtained from:  Patient Demographic factors:  Male, Low socioeconomic status, Living alone, Unemployed, Adolescent or young adult, Caucasian Current Mental Status:  NA Loss Factors:  Financial problems / change in socioeconomic status Historical Factors:  Impulsivity Risk Reduction Factors:  NA  Total Time spent with patient: 45 minutes Principal Problem: Schizoaffective disorder, bipolar type ([place]) Diagnosis:  Principal Problem:   Schizoaffective disorder, bipolar type ([place])   Subjective Data:  Billy Rose is a 21 y.o. male with psychiatric hx of schizoaffective disorder bipolar type, PTSD, generalized anxiety disorder, recent admission to [facility] 2 weeks ago who presented to [facility] voluntarily due to concerns of suicidal ideation, anxiety, and depression.   Patient reports that since discharging approximately 2 weeks ago, patient had experiencing worsening depressive mood as well as worsening anxiety.  Patient reports worsening depression, anhedonia, poor energy, poor concentration, poor appetite, difficulty sleeping, hopelessness, helplessness in the past 2 weeks.  Patient also endorsed severe anxiety that that did not have an acute trigger.  Patient's main stressor appears to be his inability to "contribute" to society.  Patient wants to work or at least occupy his time.  Patient states that he is currently working with a Chief Executive Officer to apply for disability.  Patient reports having 2-3 panic attacks daily in the past day prior to hospitalization.  Patient describes panic attack as being tremulous, "isolated", and mild diaphoresis.  Patient denies any chest palpitations.  Patient reports panic attacks are in the setting of elevated anxiety rather than spontaneously out of nowhere.  Patient also stated he had significant anxiety increased when he was stuck in small room in the waiting area while awaiting his COVID test.   Acutely, patient  reports that he felt like he was "losing my mind" stating that he did not feel in control of his own body and feeling sluggish.  This ultimately led to him calling his mom and stated that he needed to return to behavioral health hospital to be evaluated.  Patient denies present SI/HI/AVH, paranoia, ideas of reference, first rank symptoms.  Patient denies any acute loss of consciousness or signs of postictal symptoms.  Patient requesting Lyndal Pulley to help manage anxiety.  Discussed that we would be able to start gabapentin to better manage this as using benzodiazepines at this time would be very addictive and problematic.  Patient verbalized understanding.   Discussed with patient recent substance use.  Patient reports that he is drinking 1 40 ounce of beer per day as well as smoking half a pack of cigarettes per day.  Patient denies any THC use recently as his mother had taken that from him due to a possibility THC leading to worsening psychosis.  Patient denies any other substance use in the interim. Patient reports that he has not been seen by ACT team.  Continued Clinical Symptoms:  Alcohol Use Disorder Identification Test Final Score (AUDIT): 4 The "Alcohol Use Disorders Identification Test", Guidelines for Use in Primary Care, Second Edition.  World Pharmacologist [facility]). Score between 0-7:  no or low risk or alcohol related problems. Score between 8-15:  moderate risk of alcohol related problems. Score between 16-19:  high risk of alcohol related problems. Score 20 or above:  warrants further diagnostic evaluation for alcohol dependence and treatment.   CLINICAL FACTORS:   Severe Anxiety and/or Agitation Depression:   Hopelessness Impulsivity More than one psychiatric diagnosis   Musculoskeletal: Strength & Muscle Tone: within normal limits Gait &  Station: normal Patient leans: N/A  Psychiatric Specialty Exam:  Presentation  General Appearance: Appropriate for Environment;  Casual   Eye Contact:Minimal   Speech:Clear and Coherent; Normal Rate   Speech Volume:Normal   Handedness:Right   Mood and Affect  Mood:Euthymic   Affect:Flat    Thought Process  Thought Processes:Goal Directed; Coherent   Descriptions of Associations:Intact   Orientation:Full (Time, Place and Person)   Thought Content:Logical   History of Schizophrenia/Schizoaffective disorder:No data recorded  Duration of Psychotic Symptoms:No data recorded  Hallucinations:No data recorded  Ideas of Reference:None   Suicidal Thoughts:No data recorded  Homicidal Thoughts:No data recorded   Sensorium  Memory:Immediate Fair; Recent Fair; Remote Fair   Judgment:Impaired   Insight:Fair    Executive Functions  Concentration:Fair   Attention Span:Fair   Recall:Fair   Fund of Knowledge:Fair   Language:Fair    Psychomotor Activity  Psychomotor Activity:No data recorded   Assets  Assets:Communication Skills; Desire for Improvement; Financial Resources/Insurance; Physical Health    Sleep  Sleep:No data recorded    Physical Exam: Physical Exam Vitals and nursing note reviewed.  Constitutional:      Appearance: Normal appearance. He is normal weight.  HENT:     Head: Normocephalic and atraumatic.  Pulmonary:     Effort: Pulmonary effort is normal.  Neurological:     General: No focal deficit present.     Mental Status: He is oriented to person, place, and time.    Review of Systems  Respiratory:  Negative for shortness of breath.   Cardiovascular:  Negative for chest pain.  Gastrointestinal:  Negative for abdominal pain, constipation, diarrhea, heartburn, nausea and vomiting.  Neurological:  Negative for headaches.   Blood pressure 116/69, pulse (!) 101, temperature 98.6 F (37 C), temperature source Oral, resp. rate 18, height 5\' 8"  (1.727 m), weight 56.7 kg, SpO2 99 %. Body mass index is 19.01 kg/m.   COGNITIVE FEATURES THAT  CONTRIBUTE TO RISK:  None    SUICIDE RISK:   Moderate:  There are no identifiable plans, no associated intent, mild dysphoria and related symptoms, some other risk factors including recent psychosis, and identifiable protective factors, including available and accessible social support.  PLAN OF CARE: see H&P  I certify that inpatient services furnished can reasonably be expected to improve the patient's condition.   , MD 02/25/2022, 12:14 PM

## 2022-02-25 NOTE — H&P (Addendum)
Psychiatric Adult Admission Assessment  Patient Identification: Billy Rose MRN:  [id number] Date of Evaluation:  02/25/2022 Chief Complaint:  Schizoaffective disorder, bipolar type (HCC) [F25.0] Principal Diagnosis: Schizoaffective disorder, bipolar type (HCC) Diagnosis:  Principal Problem:   Schizoaffective disorder, bipolar type (HCC)   CC: History of Present Illness:  Billy Rose is a 21 y.o. male with psychiatric hx of schizoaffective disorder bipolar type, PTSD, generalized anxiety disorder, [facility] ago who [facility] due to concerns of suicidal ideation, anxiety, and depression.  Patient reports that since discharging approximately 2 weeks ago, patient had experiencing worsening depressive mood as well as worsening anxiety.  Patient reports worsening depression, anhedonia, poor energy, poor concentration, poor appetite, difficulty sleeping, hopelessness, helplessness in the past 2 weeks.  Patient also endorsed severe anxiety that that did not have an acute trigger.  Patient's main stressor appears to be his inability to "contribute" to society.  Patient wants to work or at least occupy his time.  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  Patient reports having 2-3 panic attacks daily in the past day prior to hospitalization.  Patient describes panic attack as being tremulous, "isolated", and mild diaphoresis.  Patient denies any chest palpitations.  Patient reports panic attacks are in the setting of elevated anxiety rather than spontaneously out of nowhere.  Patient also stated he had significant anxiety increased when he was stuck in small room in the waiting area while awaiting his COVID test.  Acutely, patient reports that he felt like he was "losing my mind" stating that he did not feel in control of his own body and feeling sluggish.  This ultimately led to him calling his mom and stated that he needed  to return to behavioral health hospital to be evaluated.  Patient denies present SI/HI/AVH, paranoia, ideas of reference, first rank symptoms.  Patient denies any acute loss of consciousness or signs of postictal symptoms.  Patient requesting Pincus SanesXanax/Ativan to help manage anxiety.  Discussed that we would be able to start gabapentin to better manage this as using benzodiazepines at this time would be very addictive and problematic.  Patient verbalized understanding.  Patient also stated that he had drank some beer with 2 pills of Haldol rather than taking the one he normally takes in the morning and then 1 at night.  Patient states that he has no idea why he did this and had acted impulsively.  Patient denies that this was an attempt to end his life.  Discussed with patient recent substance use.  Patient reports that he is drinking 1 40 ounce of beer per day as well as smoking half a pack of cigarettes per day.  Patient denies any THC use recently as his mother had taken that from him due to a possibility THC leading to worsening psychosis.  Patient denies any other substance use in the interim. Patient reports that he has not been seen by ACT team.  Collateral: Mother Tour manager([name] Steadman) Information obtained outlined below: Spoke with mother regarding patient's presentation to the hospital.  Mother states that patient had actually been doing overall doing well with regards to his psychosis.  Patient's mom has visited him daily to check in on him and states he had been doing well overall.  Mom does note that patient had been on haloperidol oral only starting last Wednesday due to pharmacy being unable to fill the prescription until last Wednesday.  Mom ensures that  patient at least takes morning dose and allows patient to take nighttime dose himself.  Mom reports that patient seems to be compliant based on pills in bottle with regards to Prozac and haloperidol.  Mom reports patient had texted mom so that Could be  taking care of.  Yesterday, patient then called mom to be taken to the hospital to be evaluated as here was claiming suicidal ideation.  Upon arriving to [facility] and waiting for the COVID test, patient wanted to leave as he had been stuck and small room for some time; however, patient was admitted finally and had no further complaints.  Mother states that he does not appear psychotic and primarily just seems depressed and anxious.  No bizarre behaviors noted by mom prior to this hospitalization.  Past Psychiatric Hx: Previous Psych Diagnoses: psychosis, PTSD (panic, tremors), anxiety, depression, unspecified schizophrenia spectrum disorder Prior inpatient treatment:  yes  Current/prior outpatient treatment: prozac and risperdal. Not really.  Psychotherapy hx: no History of suicide: pt reported 4 History of homicide: none Psychiatric medication history: olanzapine (zombie), abilify (noncompliance), lithium (tired), lamictal (urinary complaints), lurasidone (fatigue) Psychiatric medication compliance history: complies Neuromodulation history: denies Current Psychiatrist: (unknown) Current therapist: no  Substance Abuse Hx: Alcohol: 1 40 oz of beer per day Tobacco: 1/2 ppd Illicit drugs: denies Rx drug abuse: denies Rehab hx: none  Past Medical History: Medical Diagnoses:  Past Medical History:  Diagnosis Date   Headache    MDD (major depressive disorder), recurrent, severe, with psychosis (HCC)    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 Yes Did it involve sudden or severe rash/hives, skin peeling, or any reaction on the inside of your mouth or nose? Unk Did you need to seek medical attention at a hospital or doctor's office? Unk When did it last happen? Childhood If  all above answers are "NO", may proceed with cephalosporin use.   Sulfa Antibiotics Anaphylaxis, Shortness Of Breath, Swelling, Rash and Other (See Comments)    Throat and face swell   Abilify [Aripiprazole] Other (See Comments)    Worsened the patient   PCP: Pcp, No  Family History: Medical: no Psych: bipolar disorder from father, phobia/postpartum per records SA/HA: no Substance use family hx: denies  Social History: Children: none Employment: unemployed Education: grade 12, [employer]  Housing: apartment Finances/Disability: applying for disability Legal difficulties: none Military: none  Risk to self: mild Risk to others: none  Lab Results:  Results for orders placed or performed during the hospital encounter of 02/24/22 (from the past 48 hour(s))  SARS Coronavirus 2 by RT PCR (hospital order, performed in [facility] hospital lab) *cepheid single result test* Anterior Nasal Swab     Status: None   Collection Time: 02/24/22  2:33 PM   Specimen: Anterior Nasal Swab  Result Value Ref Range   SARS Coronavirus 2 by RT PCR NEGATIVE NEGATIVE    Comment: (NOTE) SARS-CoV-2 target nucleic acids are NOT DETECTED.  The SARS-CoV-2 RNA is generally detectable in upper and lower respiratory specimens during the acute phase of infection. The lowest concentration of SARS-CoV-2 viral copies this assay can detect is 250 copies / mL. A negative result does not preclude SARS-CoV-2 infection and should not be used as the sole basis for treatment or other patient  management decisions.  A negative result may occur with improper specimen collection / handling, submission of specimen other than nasopharyngeal swab, presence of viral mutation(s) within the areas targeted by this assay, and inadequate number of viral copies (<250 copies / mL). A negative result must be combined with clinical observations, patient history, and epidemiological information.  Fact Sheet for Patients:    RoadLapTop.co.za  Fact Sheet for Healthcare Providers: http://kim-miller.com/  This test is not yet approved or  cleared by the [place] FDA and has been authorized for detection and/or diagnosis of SARS-CoV-2 by FDA under an Emergency Use Authorization (EUA).  This EUA will remain in effect (meaning this test can be used) for the duration of the COVID-19 declaration under Section 564(b)(1) of the Act, 21 U.S.C. section 360bbb-3(b)(1), unless the authorization is terminated or revoked sooner.  Performed at [facility], [address], [place], [place] [address]     Blood Alcohol level:  Lab Results  Component Value Date   ETH <10 02/05/2022   ETH <10 04/16/2021    Metabolic Disorder Labs:  Lab Results  Component Value Date   HGBA1C 5.1 02/07/2022   MPG 99.67 02/07/2022   MPG 102.54 10/10/2020   Lab Results  Component Value Date   PROLACTIN 24.1 (H) 05/22/2020   PROLACTIN 32.1 (H) 02/15/2017   Lab Results  Component Value Date   CHOL 128 02/07/2022   TRIG 81 02/07/2022   HDL 51 02/07/2022   CHOLHDL 2.5 02/07/2022   VLDL 16 02/07/2022   LDLCALC 61 02/07/2022   LDLCALC 52 10/10/2020    Musculoskeletal: Strength & Muscle Tone: wnl Gait & Station: n/a AIMS: 0, no coghweeling, no rigidity  Psychiatric Specialty Exam: Presentation  General Appearance: Appropriate for Environment; Casual  Eye Contact:Minimal  Speech:clear and coherent with occasional speech latency  Speech Volume:Normal   Mood and Affect  Mood:described as anxious and depressed; appears aloof  Affect:Flat   Thought Process  Thought Processes:Linear but concrete; some slowing noted with mild latency with responses noted  Descriptions of Associations:Intact  Orientation:Full (Time, Place and Person)  Thought Content:Denies SI, HI, AVH, paranoia, ideas of reference or first rank symptoms - some speech latency but  is not grossly responding to internal stimuli on exam  Hallucinations:Denied Ideas of Reference:None  Suicidal Thoughts:Denied (SI reported prior to admission) Homicidal Thoughts:Denied  Sensorium  Memory:Immediate Fair; Recent Fair; Remote Fair  Judgment:Fair   Insight:Shallow   Executive Functions  Concentration:Fair  Attention Span:Fair  Recall:Fair  [employer] of Knowledge:Fair  Language:Fair   Psychomotor Activity  Psychomotor Activity:No data recorded  Assets  Assets:Communication Skills; Desire for Improvement; Financial Resources/Insurance; Physical Health   Physical Exam: Physical Exam Vitals and nursing note reviewed.  Constitutional:      Appearance: Normal appearance. He is normal weight.  HENT:     Head: Normocephalic and atraumatic.  Pulmonary:     Effort: Pulmonary effort is normal.  Neurological:     General: No focal deficit present.     Mental Status: He is oriented to person, place, and time.    Review of Systems  Respiratory:  Negative for shortness of breath.   Cardiovascular:  Negative for chest pain.  Gastrointestinal:  Negative for abdominal pain, constipation, diarrhea, heartburn, nausea and vomiting.  Neurological:  Negative for headaches.   Blood pressure 116/69, pulse (!) 101, temperature 98.6 F (37 C), temperature source Oral, resp. rate 18, height 5\' 8"  (1.727 m), weight 56.7 kg, SpO2 99 %. Body mass  index is 19.01 kg/m.  Treatment Plan Summary: Prakash Kimberling is a 21 y.o. male with psychiatric hx of schizoaffective disorder bipolar type, PTSD, generalized anxiety disorder, recent admission to [facility] 2 weeks ago who presented to [facility] voluntarily due to concerns of suicidal ideation, anxiety, and depression.   Safety and Monitoring: voluntarily admission to inpatient psychiatric unit for safety, stabilization and treatment Daily contact with patient to assess and evaluate symptoms and progress in treatment Appropriate medication  management to further stabilize patient Patient's case will be regularly discussed in multi-disciplinary team meeting Observation Level : q15 minute checks Vital signs: q12 hours Precautions: suicide, elopement, and assault  2. 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  Plan to start Neurontin 100 mg 3 times a day for anxiety as well as alcohol cravings.  We will not be starting on Xanax or Ativan as these have high abuse potential and are not indicated for patient's current symptoms.  Patient would most likely benefit from outpatient resources as well as social support in order to better facilitate social engagement. -INCREASE Prozac to 20 mg daily for depressive symptoms and anxiety (r/b/se discussed and patient agrees to medication trial. Discussed black box warning regarding increased suicidality given patient's age and patient understands and is able to contract for safety) -Continue haloperidol 10 mg twice daily for an additional week for oral bridge -Patient received haloperidol decanoate 100 mg 9/13 and 2nd dose 9/15 for psychosis (next dose 200 mg due on 03/14/22) -Start benztropine 0.5 mg twice daily scheduled for prophylaxis of EPS (r/b/se discussed with patient and patient agrees to medication trial) -Monitor for catatonia symptoms as patient does appear to have some latency in speech and thought process although these could be of akinesia secondary to haloperidol at such a high dose -Start gabapentin 100 mg 3 times daily for anxiety and alcohol cravings (R/B/SE discussed with patient patient agreeable to medication trial) - Start CIWA with MVI, thiamine and PRN Ativan for scores >10 -EtOH, CBC, UDS, UA, CK, CMP ordered for admission labs -- Encouraged patient to  participate in unit milieu and in scheduled group therapies  --Contacting ACTT to see if they made contact after recent discharge --Patient would benefit from social support or outpatient PHP/IOP upon discharge  3. Medical Management Admission labs ordered  PRN The following PRN medications were added to ensure patient can focus on treatment. These were discussed with patient and patient aware of ability to ask for the following medications:  -Tylenol 650 mg q6hr PRN for mild pain -Mylanta 30 ml suspension for indigestion -Milk of Magnesia 30 ml for constipation -Trazodone 50 mg qhs for insomnia -Hydroxyzine 25 mg tid PRN for anxiety  4. Discharge Planning Patient will require the following based on my assessment:  Greatly appreciate CSW and Case management assistance with facilitating these needs and any further recommendations regarding patient's needs upon discharge. Estimated LOS: 5 days Discharge Concerns: Need to establish a safety plan; Medication compliance and effectiveness Discharge Goals: Return home with outpatient referrals for mental health follow-up including medication management/psychotherapy   Long Term Goal(s): Minimizing disruption current psychiatric diagnosis is causing so that patient can be safely discharged Short Term Goals: Compliance with proposed treatment plan and adjusting to psychiatric unit and peers.  I certify that inpatient services furnished can reasonably be expected to improve the patient's condition.  France Ravens, MD PGY2 Psychiatry Resident 02/25/2022 9:50 AM

## 2022-02-25 NOTE — B[facility] Counselor (Signed)
CSW attempted to contact the Pt's ACT Team coordinator, Genene Churn [phone]. CSW received no answer.  CSW left a voicemail asking for the ACT Team to return the call.  02/25/2022 at 10:40am

## 2022-02-25 NOTE — Plan of Care (Signed)
Pt alert, verbal and able to make needs known. Pt denies current SI/HI/AVH but reports worsening depression and anxiety. Pt initially presented to the medication window requesting Xanax. Provider aware. Denies pain at present. Q41min checks continue as ordered . Contracts for safety.   Problem: Education: Goal: Knowledge of [facility] Education information/materials will improve 02/25/2022 1043 by Luanna Salk, RN Outcome: Progressing 02/25/2022 1040 by Luanna Salk, RN Outcome: Progressing Goal: Emotional status will improve 02/25/2022 1043 by Luanna Salk, RN Outcome: Progressing 02/25/2022 1040 by Luanna Salk, RN Outcome: Progressing Goal: Mental status will improve 02/25/2022 1043 by Luanna Salk, RN Outcome: Progressing 02/25/2022 1040 by Luanna Salk, RN Outcome: Progressing Goal: Verbalization of understanding the information provided will improve Outcome: Progressing
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
## 2022-02-25 NOTE — Progress Notes (Signed)
Psychoeducational Group Note  Date:  02/25/2022 Time:  2021  Group Topic/Focus:  Wrap-Up Group:   The focus of this group is to help patients review their daily goal of treatment and discuss progress on daily workbooks.  Participation Level: Did Not Attend  Participation Quality:  Not Applicable  Affect:  Not Applicable  Cognitive:  Not Applicable  Insight:  Not Applicable  Engagement in Group: Not Applicable  Additional Comments:  The patient did not attend group this evening.   Trysta Showman S 02/25/2022, 8:21 PM

## 2022-02-25 NOTE — Progress Notes (Signed)
Pt reports that he would like to work on improving his sleep. He endorses a depressed mood, poor concentration, and poor sleep. He reports trouble falling asleep. He said that he normally sleeps 8 hours during the day instead of at night time. He said that he last drank alcohol earlier today, "half of a 40 ounce." He denies any withdrawal symptoms at this time besides mild anxiety. HFR protocol implemented and pt verbally demonstrated understanding. He said that he had already taken 2 doses of his haldol today, so his scheduled bedtime dose was not administered. He has been isolative in his room. Encouraged pt to participate in the milieu. Pt denies SI/HI and AVH. Q 15 min safety checks continue. Pt's safety has been maintained.   02/24/22 1945  Psych Admission Type (Psych Patients Only)  Admission Status Voluntary  Psychosocial Assessment  Patient Complaints Anxiety;Depression;Insomnia;Decreased concentration;Sleep disturbance  Eye Contact Brief  Facial Expression Flat  Affect Anxious;Flat  Speech Logical/coherent;Soft;Slow  Interaction Forwards little;Minimal  Motor Activity Slow  Appearance/Hygiene Disheveled  Behavior Characteristics Cooperative;Guarded  Mood Anxious;Depressed;Pleasant  Thought Process  Coherency Concrete thinking  Content WDL  Delusions None reported or observed  Perception WDL  Hallucination None reported or observed  Judgment Poor  Confusion None  Danger to Self  Current suicidal ideation? Denies  Agreement Not to Harm Self Yes  Description of Agreement verbally contracts for safety  Danger to Others  Danger to Others None reported or observed

## 2022-02-25 NOTE — B[facility] Suicide Risk Assessment (Signed)
[facility] INPATIENT:  Family/Significant Other Suicide Prevention Education  Suicide Prevention Education:  Education Completed; Billy Rose [phone] (Mother) has been identified by the patient as the family member/significant other with whom the patient will be residing, and identified as the person(s) who will aid the patient in the event of a mental health crisis (suicidal ideations/suicide attempt).  With written consent from the patient, the family member/significant other has been provided the following suicide prevention education, prior to the and/or following the discharge of the patient.  The suicide prevention education provided includes the following: Suicide risk factors Suicide prevention and interventions National Suicide Hotline telephone number [facility] assessment telephone number [facility] Emergency Assistance [place] and/or Residential Mobile Crisis Unit telephone number  Request made of family/significant other to: Remove weapons (e.g., guns, rifles, knives), all items previously/currently identified as safety concern.   Remove drugs/medications (over-the-counter, prescriptions, illicit drugs), all items previously/currently identified as a safety concern.  The family member/significant other verbalizes understanding of the suicide prevention education information provided.  The family member/significant other agrees to remove the items of safety concern listed above.  CSW spoke with Mrs. [name] who states that her son called her and stated that "he felt like he was going crazy again".  She states that her son told her that he was having slow movements and wanted to go to the hospital.  She states that her son told her that he had taken 2 Haldol pills before she got to him.  She states that this was not a suicide attempt and that he is scheduled to take 2 Haldol pills each day "one in the morning and one at night".  She states that her son has  been taking both at one time in the morning for the past week.  She states that her son did tell her that he was having suicidal thoughts and some depression.  She states that he has been taking his medications consistently but has not wanted to reach out to [facility].  Mrs. Jett states that there are no firearms in her son's home and that he can return there after discharge.  She states that she goes to check on him daily.  CSW completed SPE with Mrs. Garber.   Frutoso Chase Esaul Dorwart 02/25/2022, 1:49 PM

## 2022-02-25 NOTE — Group Note (Signed)
Date:  02/25/2022 Time:  1:31 PM  Group Topic/Focus:  Orientation:   The focus of this group is to educate the patient on the purpose and policies of crisis stabilization and provide a format to answer questions about their admission.  The group details unit policies and expectations of patients while admitted.    Participation Level:  Did Not Attend  Participation Quality:      Affect:      Cognitive:      Insight: None  Engagement in Group:  Defensive and    Modes of Intervention:      Additional Comments:     Jerrye Beavers 02/25/2022, 1:31 PM

## 2022-02-25 NOTE — Group Note (Signed)
Recreation Therapy Group Note   Group Topic:Animal Assisted Therapy   Group Date: 02/25/2022 Start Time: 1430 End Time: 1515 Facilitators: Elwyn Klosinski-McCall, LRT,CTRS Location: [address] Dayroom   Animal-Assisted Activity (AAA) Program Checklist/Progress Notes Patient Eligibility Criteria Checklist & Daily Group note for Rec Tx Intervention  AAA/T Program Assumption of Risk Form signed by Patient/ or Parent Legal Guardian Yes  Patient is free of allergies or severe asthma Yes  Patient reports no fear of animals Yes  Patient reports no history of cruelty to animals Yes  Patient understands his/her participation is voluntary Yes  Patient washes hands before animal contact Yes  Patient washes hands after animal contact Yes   Affect/Mood: Appropriate   Participation Level: Engaged   Participation Quality: Independent   Behavior: Appropriate    Clinical Observations/Individualized Feedback:  Patient attended session and interacted appropriately with therapy dog and peers. Patient asked appropriate questions about therapy dog and his training. Patient shared stories about their pets at home with group.    Plan: Continue to engage patient in RT group sessions 2-3x/week.   Valory Wetherby-McCall, LRT,CTRS 02/25/2022 3:26 PM

## 2022-02-25 NOTE — Progress Notes (Signed)
Patient has been reminded several times by writer and MHT that we need a urine specimen. He has a cup.

## 2022-02-26 ENCOUNTER — Encounter (HOSPITAL_COMMUNITY): Payer: Self-pay

## 2022-02-26 LAB — URINALYSIS, COMPLETE (UACMP) WITH MICROSCOPIC
Bacteria, UA: NONE SEEN
Bilirubin Urine: NEGATIVE
Glucose, UA: NEGATIVE mg/dL
Hgb urine dipstick: NEGATIVE
Ketones, ur: NEGATIVE mg/dL
Leukocytes,Ua: NEGATIVE
Nitrite: NEGATIVE
Protein, ur: NEGATIVE mg/dL
Specific Gravity, Urine: 1.024 (ref 1.005–1.030)
pH: 5 (ref 5.0–8.0)

## 2022-02-26 LAB — URINALYSIS, ROUTINE W REFLEX MICROSCOPIC
Bacteria, UA: NONE SEEN
Bilirubin Urine: NEGATIVE
Glucose, UA: NEGATIVE mg/dL
Ketones, ur: NEGATIVE mg/dL
Leukocytes,Ua: NEGATIVE
Nitrite: NEGATIVE
Protein, ur: NEGATIVE mg/dL
Specific Gravity, Urine: 1.021 (ref 1.005–1.030)
pH: 6 (ref 5.0–8.0)

## 2022-02-26 LAB — COMPREHENSIVE METABOLIC PANEL
ALT: 31 U/L (ref 0–44)
AST: 20 U/L (ref 15–41)
Albumin: 4 g/dL (ref 3.5–5.0)
Alkaline Phosphatase: 68 U/L (ref 38–126)
Anion gap: 7 (ref 5–15)
BUN: 16 mg/dL (ref 6–20)
CO2: 26 mmol/L (ref 22–32)
Calcium: 9 mg/dL (ref 8.9–10.3)
Chloride: 105 mmol/L (ref 98–111)
Creatinine, Ser: 0.84 mg/dL (ref 0.61–1.24)
GFR, Estimated: >60 mL/min (ref >60)
Glucose, Bld: 92 mg/dL (ref 70–99)
Potassium: 4.1 mmol/L (ref 3.5–5.1)
Sodium: 138 mmol/L (ref 135–145)
Total Bilirubin: 0.4 mg/dL (ref 0.3–1.2)
Total Protein: 6.7 g/dL (ref 6.5–8.1)

## 2022-02-26 LAB — CBC
HCT: 44 % (ref 39.0–52.0)
Hemoglobin: 15 g/dL (ref 13.0–17.0)
MCH: 31.6 pg (ref 26.0–34.0)
MCHC: 34.1 g/dL (ref 30.0–36.0)
MCV: 92.6 fL (ref 80.0–100.0)
Platelets: 264 10*3/uL (ref 150–400)
RBC: 4.75 MIL/uL (ref 4.22–5.81)
RDW: 12.8 % (ref 11.5–15.5)
WBC: 6.5 10*3/uL (ref 4.0–10.5)
nRBC: 0 % (ref 0.0–0.2)

## 2022-02-26 LAB — ETHANOL: Alcohol, Ethyl (B): <10 mg/dL (ref <10)

## 2022-02-26 LAB — RAPID URINE DRUG SCREEN, HOSP PERFORMED
Amphetamines: NOT DETECTED
Barbiturates: NOT DETECTED
Benzodiazepines: NOT DETECTED
Cocaine: NOT DETECTED
Opiates: NOT DETECTED
Tetrahydrocannabinol: POSITIVE — AB

## 2022-02-26 LAB — CK: Total CK: 28 U/L — ABNORMAL LOW (ref 49–397)

## 2022-02-26 NOTE — BH IP Treatment Plan (Signed)
Interdisciplinary Treatment and Diagnostic Plan Update  02/26/2022 Time of Session: 9:25am  Billy Rose MRN: [id number]  Principal Diagnosis: Schizoaffective disorder, bipolar type ([place])  Secondary Diagnoses: Principal Problem:   Schizoaffective disorder, bipolar type ([place])   Current Medications:  Current Facility-Administered Medications  Medication Dose Route Frequency Provider Last Rate Last Admin   alum & mag hydroxide-simeth (MAALOX/MYLANTA) 200-200-20 MG/5ML suspension 30 mL  30 mL Oral Q4H PRN Niel Hummer, NP       benztropine (COGENTIN) tablet 0.5 mg  0.5 mg Oral BID France Ravens, MD   0.5 mg at 02/26/22 0920   FLUoxetine (PROZAC) capsule 20 mg  20 mg Oral Daily France Ravens, MD   20 mg at 02/26/22 0920   gabapentin (NEURONTIN) capsule 100 mg  100 mg Oral TID France Ravens, MD   100 mg at 02/26/22 0920   haloperidol (HALDOL) tablet 10 mg  10 mg Oral BID France Ravens, MD   10 mg at 02/26/22 0920   hydrOXYzine (ATARAX) tablet 25 mg  25 mg Oral Q6H PRN Harlow Asa, MD       loperamide (IMODIUM) capsule 2-4 mg  2-4 mg Oral PRN Harlow Asa, MD       LORazepam (ATIVAN) tablet 1 mg  1 mg Oral Q6H PRN Harlow Asa, MD       OLANZapine zydis (ZYPREXA) disintegrating tablet 5 mg  5 mg Oral Q8H PRN Harlow Asa, MD       And   LORazepam (ATIVAN) tablet 1 mg  1 mg Oral PRN Harlow Asa, MD       And   ziprasidone (GEODON) injection 20 mg  20 mg Intramuscular PRN Nelda Marseille, Amy E, MD       magnesium hydroxide (MILK OF MAGNESIA) suspension 30 mL  30 mL Oral Daily PRN Niel Hummer, NP       multivitamin with minerals tablet 1 tablet  1 tablet Oral Daily Nelda Marseille, Amy E, MD   1 tablet at 02/26/22 0920   ondansetron (ZOFRAN-ODT) disintegrating tablet 4 mg  4 mg Oral Q6H PRN Harlow Asa, MD       thiamine (Vitamin B-1) tablet 100 mg  100 mg Oral Daily Nelda Marseille, Amy E, MD   100 mg at 02/26/22 0920   traZODone (DESYREL) tablet 50 mg  50 mg Oral QHS PRN Niel Hummer, NP    50 mg at 02/24/22 2057   PTA Medications: Medications Prior to Admission  Medication Sig Dispense Refill Last Dose   benztropine (COGENTIN) 1 MG tablet Take 1 tablet (1 mg total) by mouth 2 (two) times daily as needed for tremors. 30 tablet 0 never started   FLUoxetine (PROZAC) 10 MG capsule Take 1 capsule (10 mg total) by mouth daily. 30 capsule 0 02/24/2022   haloperidol (HALDOL) 10 MG tablet Take 1 tablet (10 mg total) by mouth 2 (two) times daily. 60 tablet 0 02/24/2022   [START ON 03/14/2022] haloperidol decanoate (HALDOL DECANOATE) 100 MG/ML injection Inject 2 mLs (200 mg total) into the muscle every 28 (twenty-eight) days. 1 mL 0 02/14/2022   hydrOXYzine (ATARAX) 25 MG tablet Take 1 tablet (25 mg total) by mouth 3 (three) times daily as needed for anxiety. 30 tablet 0 02/13/2022   traZODone (DESYREL) 50 MG tablet Take 1 tablet (50 mg total) by mouth at bedtime as needed for sleep. 30 tablet 0 02/13/2022    Patient Stressors: Legal issue   Marital or family conflict  Medication change or noncompliance   Occupational concerns   Traumatic event   Other:  Patient Strengths: Ability for insight  Average or above average intelligence  Capable of independent living  Communication skills  General fund of knowledge  Motivation for treatment/growth  Physical Health  Supportive family/friends   Treatment Modalities: Medication Management, Group therapy, Case management,  1 to 1 session with clinician, Psychoeducation, Recreational therapy.   Physician Treatment Plan for Primary Diagnosis: Schizoaffective disorder, bipolar type ([place]) Long Term Goal(s):     Short Term Goals:    Medication Management: Evaluate patient's response, side effects, and tolerance of medication regimen.  Therapeutic Interventions: 1 to 1 sessions, Unit Group sessions and Medication administration.  Evaluation of Outcomes: Not Met  Physician Treatment Plan for Secondary Diagnosis: Principal Problem:    Schizoaffective disorder, bipolar type ([place])  Long Term Goal(s):     Short Term Goals:       Medication Management: Evaluate patient's response, side effects, and tolerance of medication regimen.  Therapeutic Interventions: 1 to 1 sessions, Unit Group sessions and Medication administration.  Evaluation of Outcomes: Not Met   RN Treatment Plan for Primary Diagnosis: Schizoaffective disorder, bipolar type ([place]) Long Term Goal(s): Knowledge of disease and therapeutic regimen to maintain health will improve  Short Term Goals: Ability to remain free from injury will improve, Ability to participate in decision making will improve, Ability to verbalize feelings will improve, Ability to disclose and discuss suicidal ideas, and Ability to identify and develop effective coping behaviors will improve  Medication Management: RN will administer medications as ordered by provider, will assess and evaluate patient's response and provide education to patient for prescribed medication. RN will report any adverse and/or side effects to prescribing provider.  Therapeutic Interventions: 1 on 1 counseling sessions, Psychoeducation, Medication administration, Evaluate responses to treatment, Monitor vital signs and CBGs as ordered, Perform/monitor CIWA, COWS, AIMS and Fall Risk screenings as ordered, Perform wound care treatments as ordered.  Evaluation of Outcomes: Not Met   LCSW Treatment Plan for Primary Diagnosis: Schizoaffective disorder, bipolar type ([place]) Long Term Goal(s): Safe transition to appropriate next level of care at discharge, Engage patient in therapeutic group addressing interpersonal concerns.  Short Term Goals: Engage patient in aftercare planning with referrals and resources, Increase social support, Increase emotional regulation, Facilitate acceptance of mental health diagnosis and concerns, Identify triggers associated with mental health/substance abuse issues, and Increase skills for  wellness and recovery  Therapeutic Interventions: Assess for all discharge needs, 1 to 1 time with Social worker, Explore available resources and support systems, Assess for adequacy in community support network, Educate family and significant other(s) on suicide prevention, Complete Psychosocial Assessment, Interpersonal group therapy.  Evaluation of Outcomes: Not Met   Progress in Treatment: Attending groups: No. Participating in groups: No. Taking medication as prescribed: Yes. Toleration medication: Yes. Family/Significant other contact made: Yes, individual(s) contacted:  Mother  Patient understands diagnosis: No. Discussing patient identified problems/goals with staff: Yes. Medical problems stabilized or resolved: Yes. Denies suicidal/homicidal ideation: Yes. Issues/concerns per patient self-inventory: No.   New problem(s) identified: No, Describe:  None   New Short Term/Long Term Goal(s): medication stabilization, elimination of SI thoughts, development of comprehensive mental wellness plan.   Patient Goals: "To discharge"   Discharge Plan or Barriers: Patient recently admitted. CSW will continue to follow and assess for appropriate referrals and possible discharge planning.   Reason for Continuation of Hospitalization: Anxiety Depression Medication stabilization Suicidal ideation  Estimated Length of Stay: 3  to 7 days   Last 3 [place] Suicide Severity Risk Score: Flowsheet Row Admission (Current) from OP Visit from 02/24/2022 in [facility] Admission (Discharged) from 02/07/2022 in [facility] ED from 02/05/2022 in [facility] DEPT  C-SSRS RISK CATEGORY No Risk Low Risk High Risk       Last PHQ 2/9 Scores:     No data to display          Scribe for Treatment Team: Carney Harder 02/26/2022 11:34 AM

## 2022-02-26 NOTE — Progress Notes (Addendum)
[facility] MD Progress Note  02/26/2022 10:12 AM Billy Rose  MRN:  [id number] Principal Problem: Schizoaffective disorder, bipolar type (HCC) Diagnosis: Principal Problem:   Schizoaffective disorder, bipolar type [facility])  Chief Complaint: depression  Reason for Admission: Billy Rose is a 21 y.o. male with psychiatric hx of schizoaffective disorder bipolar type, PTSD, generalized anxiety disorder, and recent admission to [facility] 2 weeks ago, who presented to [facility] voluntarily due to concerns of suicidal ideation, anxiety, and depression.The patient is currently on Hospital Day 2.   Yesterday's Psychiatry Team's Recommendation: Started Neurontin 100 mg TID for anxiety and alcohol cravings. Will not start Xanax or Ativan due to high potential for abuse. Increased Prozac to 20 mg daily for depressive sx and anxiety. Continue haloperidol 10 mg twice daily as oral bridge while Haldol dec reaches steady state. Start benztropine 0.5 mg twice daily scheduled for prophylaxis of EPS. Start CIWA with MVI, thiamine and PRN Ativan for scores >10. Contacting ACTT to see if they made contact after recent discharge.   Subjective:  Patient he slept well and his appetite is okay. He denies any SI/HI and AVH today. He denies feelings of paranoia, thought insertion, withdrawal, broadcasting, and denies receiving messages from electronic devices. He states his anxiety today is 0/10 compared to 9/10 yesterday and attributes improvement to medication. Patient states his depression has remained a 7/10. He denies any cravings or withdrawal from alcohol use. Patient asked about discharge planning and states this is ironic because he "does not want to go back to his old life." He states groups are going fine and he will participate today. Patient is informed that he will be here for at least a few more days so we can check-in on medication management. Goals reviewed today include showering and attending groups.   Objective:  Chart  Review Past 24 hours of patient's chart was reviewed.  Patient was compliant with scheduled meds except refusal of nicotine patch Required PRNs: none  Per RN notes, no documented behavioral issues and is attending group. Patient slept, 8 hours  Total Time Spent in Direct Patient Care:  I personally spent 30 minutes on the unit in direct patient care. The direct patient care time included face-to-face time with the patient, reviewing the patient's chart, communicating with other professionals, and coordinating care. Greater than 50% of this time was spent in counseling or coordinating care with the patient regarding goals of hospitalization, psycho-education, and discharge planning needs.    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 No pertinent surgical history. Family History:  Family History  Problem Relation Age of Onset   Allergic rhinitis Neg Hx    Asthma Neg Hx    Eczema Neg Hx    Urticaria Neg Hx    Family Psychiatric  History: see H&P  Social History:  Social History   Substance and Sexual Activity  Alcohol Use Yes   Comment: "4 beers every so often"     Social History   Substance and Sexual Activity  Drug Use Not Currently   Types: Cocaine, LSD, Marijuana, IV    Social History   Socioeconomic History   Marital status: Single    Spouse name: Not on file   Number of children:  Not on file   Years of education: Not on file   Highest education level: Not on file  Occupational History   Not on file  Tobacco Use   Smoking status: Every Day    Packs/day: 1.00    Types: Cigarettes   Smokeless tobacco: Never  Vaping Use   Vaping Use: Never used  Substance and Sexual Activity   Alcohol use: Yes    Comment: "4  beers every so often"   Drug use: Not Currently    Types: Cocaine, LSD, Marijuana, IV   Sexual activity: Not Currently  Other Topics Concern   Not on file  Social History Narrative   ** Merged History Encounter **       Social Determinants of Health   Financial Resource Strain: Not on file  Food Insecurity: No Food Insecurity (02/24/2022)   Hunger Vital Sign    Worried About Running Out of Food in the Last Year: Never true    Ran Out of Food in the Last Year: Never true  Transportation Needs: No Transportation Needs (02/24/2022)   PRAPARE - Administrator, Civil ServiceTransportation    Lack of Transportation (Medical): No    Lack of Transportation (Non-Medical): No  Physical Activity: Not on file  Stress: Not on file  Social Connections: Not on file   Current Medications: Current Facility-Administered Medications  Medication Dose Route Frequency Provider Last Rate Last Admin   alum & mag hydroxide-simeth (MAALOX/MYLANTA) 200-200-20 MG/5ML suspension 30 mL  30 mL Oral Q4H PRN Thermon Leylandavis, Laura A, NP       benztropine (COGENTIN) tablet 0.5 mg  0.5 mg Oral BID Park PopeJi, Andrew, MD   0.5 mg at 02/26/22 0920   FLUoxetine (PROZAC) capsule 20 mg  20 mg Oral Daily Park PopeJi, Andrew, MD   20 mg at 02/26/22 0920   gabapentin (NEURONTIN) capsule 100 mg  100 mg Oral TID Park PopeJi, Andrew, MD   100 mg at 02/26/22 0920   haloperidol (HALDOL) tablet 10 mg  10 mg Oral BID Park PopeJi, Andrew, MD   10 mg at 02/26/22 0920   hydrOXYzine (ATARAX) tablet 25 mg  25 mg Oral Q6H PRN Comer LocketSingleton, Romuald Mccaslin E, MD       loperamide (IMODIUM) capsule 2-4 mg  2-4 mg Oral PRN Comer LocketSingleton, Rogelio Winbush E, MD       LORazepam (ATIVAN) tablet 1 mg  1 mg Oral Q6H PRN Comer LocketSingleton, Tyrene Nader E, MD       OLANZapine zydis (ZYPREXA) disintegrating tablet 5 mg  5 mg Oral Q8H PRN Comer LocketSingleton, Philis Doke E, MD       And   LORazepam (ATIVAN) tablet 1 mg  1 mg Oral PRN Comer LocketSingleton, Maximillian Habibi E, MD       And   ziprasidone (GEODON) injection 20 mg  20 mg Intramuscular PRN Mason JimSingleton, Eeshan Verbrugge E, MD       magnesium hydroxide (MILK OF  MAGNESIA) suspension 30 mL  30 mL Oral Daily PRN Thermon Leylandavis, Laura A, NP       multivitamin with minerals tablet 1 tablet  1 tablet Oral Daily Mason JimSingleton, Ngai Parcell E, MD   1 tablet at 02/26/22 0920   ondansetron (ZOFRAN-ODT) disintegrating tablet 4 mg  4 mg Oral Q6H PRN Comer LocketSingleton, Zyann Mabry E, MD       thiamine (Vitamin B-1) tablet 100 mg  100 mg Oral Daily Mason JimSingleton, Jamayah Myszka E, MD   100 mg at 02/26/22 0920   traZODone (DESYREL) tablet 50 mg  50 mg Oral QHS PRN Thermon Leylandavis, Laura A, NP   50  mg at 02/24/22 2057    Lab Results:  Results for orders placed or performed during the hospital encounter of 02/24/22 (from the past 24 hour(s))  Rapid urine drug screen (hospital performed) not at [facility]     Status: Abnormal   Collection Time: 02/25/22  3:00 PM  Result Value Ref Range   Opiates NONE DETECTED NONE DETECTED   Cocaine NONE DETECTED NONE DETECTED   Benzodiazepines NONE DETECTED NONE DETECTED   Amphetamines NONE DETECTED NONE DETECTED   Tetrahydrocannabinol POSITIVE (A) NONE DETECTED   Barbiturates NONE DETECTED NONE DETECTED  Urinalysis, Routine w reflex microscopic Urine, Clean Catch     Status: Abnormal   Collection Time: 02/25/22  3:00 PM  Result Value Ref Range   Color, Urine YELLOW YELLOW   APPearance CLEAR CLEAR   Specific Gravity, Urine 1.021 1.005 - 1.030   pH 6.0 5.0 - 8.0   Glucose, UA NEGATIVE NEGATIVE mg/dL   Hgb urine dipstick MODERATE (A) NEGATIVE   Bilirubin Urine NEGATIVE NEGATIVE   Ketones, ur NEGATIVE NEGATIVE mg/dL   Protein, ur NEGATIVE NEGATIVE mg/dL   Nitrite NEGATIVE NEGATIVE   Leukocytes,Ua NEGATIVE NEGATIVE   RBC / HPF 0-5 0 - 5 RBC/hpf   WBC, UA 0-5 0 - 5 WBC/hpf   Bacteria, UA NONE SEEN NONE SEEN   Mucus PRESENT    Ca Oxalate Crys, UA PRESENT   Ethanol     Status: None   Collection Time: 02/26/22  6:59 AM  Result Value Ref Range   Alcohol, Ethyl (B) <10 <10 mg/dL  Comprehensive metabolic panel     Status: None   Collection Time: 02/26/22  6:59 AM  Result Value Ref Range    Sodium 138 135 - 145 mmol/L   Potassium 4.1 3.5 - 5.1 mmol/L   Chloride 105 98 - 111 mmol/L   CO2 26 22 - 32 mmol/L   Glucose, Bld 92 70 - 99 mg/dL   BUN 16 6 - 20 mg/dL   Creatinine, Ser 9.79 0.61 - 1.24 mg/dL   Calcium 9.0 8.9 - 89.2 mg/dL   Total Protein 6.7 6.5 - 8.1 g/dL   Albumin 4.0 3.5 - 5.0 g/dL   AST 20 15 - 41 U/L   ALT 31 0 - 44 U/L   Alkaline Phosphatase 68 38 - 126 U/L   Total Bilirubin 0.4 0.3 - 1.2 mg/dL   GFR, Estimated >11 >94 mL/min   Anion gap 7 5 - 15  CK     Status: Abnormal   Collection Time: 02/26/22  6:59 AM  Result Value Ref Range   Total CK 28 (L) 49 - 397 U/L  CBC     Status: None   Collection Time: 02/26/22  6:59 AM  Result Value Ref Range   WBC 6.5 4.0 - 10.5 K/uL   RBC 4.75 4.22 - 5.81 MIL/uL   Hemoglobin 15.0 13.0 - 17.0 g/dL   HCT 17.4 08.1 - 44.8 %   MCV 92.6 80.0 - 100.0 fL   MCH 31.6 26.0 - 34.0 pg   MCHC 34.1 30.0 - 36.0 g/dL   RDW 18.5 63.1 - 49.7 %   Platelets 264 150 - 400 K/uL   nRBC 0.0 0.0 - 0.2 %    Blood Alcohol level:  Lab Results  Component Value Date   ETH <10 02/26/2022   ETH <10 02/05/2022    Metabolic Disorder Labs: Lab Results  Component Value Date   HGBA1C 5.1 02/07/2022   MPG 99.67 02/07/2022  MPG 102.54 10/10/2020   Lab Results  Component Value Date   PROLACTIN 24.1 (H) 05/22/2020   PROLACTIN 32.1 (H) 02/15/2017   Lab Results  Component Value Date   CHOL 128 02/07/2022   TRIG 81 02/07/2022   HDL 51 02/07/2022   CHOLHDL 2.5 02/07/2022   VLDL 16 02/07/2022   LDLCALC 61 02/07/2022   LDLCALC 52 10/10/2020    Physical Findings: AIMS: 0, no coghweeling, no rigidity CIWA:  CIWA-Ar Total: 0  Musculoskeletal: Strength & Muscle Tone: within normal limits Gait & Station: normal  Psychiatric Specialty Exam:  Presentation  General Appearance: unkempt appearing and casually dressed   Eye Contact:Fair   Speech:monotone, answers direct questions with short phrases - normal fluency   Speech  Volume:Normal   Mood and Affect  Mood:Depressed; Anxious   Affect:Flat    Thought Process  Thought Processes:superficially goal directed, linear  Orientation:Full (Time, Place and Person)   Thought Content:Denies SI, HI, AVH, paranoia, delusions, ideas of reference, or first rank symptoms  Hallucinations:Hallucinations: None  Ideas of Reference:None   Suicidal Thoughts:Suicidal Thoughts: No  Homicidal Thoughts:Homicidal Thoughts: No   Sensorium  Memory:Fair   Judgment:Fair - compliant with meds   Insight:Shallow    Executive Functions  Concentration:Fair   Attention Span:Fair   [place]    Psychomotor Activity  Psychomotor Activity:Psychomotor Activity: Normal   Assets  Assets:Communication Skills; Desire for Improvement; Financial Resources/Insurance; Housing; Leisure Time; Physical Health; Resilience; Social Support; Talents/Skills   Physical Exam Vitals and nursing note reviewed.  HENT:     Head: Normocephalic.  Pulmonary:     Effort: Pulmonary effort is normal.  Neurological:     General: No focal deficit present.     Mental Status: He is alert.     Review of Systems  Respiratory:  Negative for shortness of breath.   Cardiovascular:  Negative for chest pain.  Gastrointestinal:  Negative for diarrhea, nausea and vomiting.  Neurological:  Negative for dizziness and headaches.   Blood pressure 106/83, pulse 99, temperature 97.9 F (36.6 C), temperature source Oral, resp. rate 16, height 5\' 8"  (1.727 m), weight 56.7 kg, SpO2 98 %. Body mass index is 19.01 kg/m.   ASSESSMENT AND PLAN Billy Rose is a 21 y.o. male with psychiatric hx of schizoaffective disorder bipolar type, PTSD, generalized anxiety disorder, recent admission to [facility] 2 weeks ago who presented to [facility] voluntarily due to concerns of suicidal ideation, anxiety, and depression.This is hospitalization day 2.  PLAN Safety  and Monitoring: voluntary admission to inpatient psychiatric unit for safety, stabilization and treatment Daily contact with patient to assess and evaluate symptoms and progress in treatment Patient's case to be discussed in multi-disciplinary team meeting Observation Level : q15 minute checks Vital signs: q12 hours Precautions: suicide, elopement, and assault   Psychiatric Problems Schizoaffective, Bipolar type-currently depressed GAD w/ panic attacks - Continue Prozac 20 mg daily for depressive symptoms and anxiety  -Continue haloperidol 10 mg twice daily until next LAI injection as oral bridge while LAI reaches steady state -Patient received haloperidol decanoate 100 mg 9/13 and 2nd dose 9/15 for psychosis (next dose 200 mg due on 03/14/22) - Continue Benztropine 0.5 mg twice daily scheduled for prophylaxis of EPS  -- Encouraged patient to participate in unit milieu and in scheduled group therapies  --Contacting ACTT to see if they made contact after recent discharge --Patient would benefit from social support or outpatient PHP/IOP upon discharge  Alcohol Misuse (r/o  alcohol use d/o) 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 by self-report -EtOH <10 BAL,  UDS pos for tetrahydrocannabinol  -Gabapentin 100 mg 3 times daily for anxiety and alcohol cravings  - Continue CIWA with MVI, thiamine and PRN Ativan for scores >10 (recent scores 0,1,0)  Medical Problems Admission labs ordered and reviewed: UA pos for moderate Hgb on dipstick, CK 28, CMP WNL CBC WNL, ETOH <10, UDS positive for THC  UA with moderate Hbg on dipstick - will repeat clean catch UA and see if patient is symptomatic   PRNs Tylenol 650 mg for mild pain Maalox/Mylanta 30 mL for indigestion Hydroxyzine 25 mg tid for anxiety Milk of Magnesia 30 mL for constipation Trazodone 50 mg for sleep   4. Discharge Planning: Social work and case management to assist with discharge planning and identification of  hospital follow-up needs prior to discharge Estimated LOS: 5-7 days Discharge Concerns: Need to establish a safety plan; Medication compliance and effectiveness Discharge Goals: Return home with outpatient referrals for mental health follow-up including medication management/psychotherapy  Bedelia Person, Medical Student 02/26/2022, 10:12 AM  Attestation for Student Documentation:   I certify that I saw and interviewed the patient together with the medical student and was present for the duration of the interview.  I reviewed the medical record.  I performed or reperformed the mental status examination of the patient as indicated.  I formulated the assessment and plan of treatment as documented above with edits. Bartholomew Crews, MD, Celene Skeen

## 2022-02-26 NOTE — Progress Notes (Signed)
Pt denied SI/HI/AVH this morning. Pt rated his depression and anxiety a 0/10. Pt reports that he slept well last night. Pt appears to be tired/sluggish with slow motor activity throughout shift. Pt has been calm and cooperative throughout the day. RN provided support and encouragement to patient. Pt given scheduled medications as prescribed. Q15 min checks verified for safety. RN will continue to monitor pt's progress and provide assistance as indicated. Pt is safe on the unit. Will continue to monitor.   02/26/22 1142  Psych Admission Type (Psych Patients Only)  Admission Status Involuntary  Psychosocial Assessment  Patient Complaints None  Eye Contact Fair  Facial Expression Blank  Affect Depressed;Flat  Speech Logical/coherent;Soft  Interaction Minimal  Motor Activity Slow  Appearance/Hygiene Unremarkable  Behavior Characteristics Cooperative  Mood Depressed;Sad  Thought Process  Coherency WDL  Content WDL  Delusions None reported or observed  Perception WDL  Hallucination None reported or observed  Judgment Limited  Confusion None  Danger to Self  Current suicidal ideation? Denies  Agreement Not to Harm Self Yes  Description of Agreement Verbally contracts for safety  Danger to Others  Danger to Others None reported or observed

## 2022-02-26 NOTE — Group Note (Signed)
LCSW Group Therapy Note  Group Date: 02/26/2022 Start Time: 1300 End Time: 1400   Type of Therapy and Topic:  Group Therapy - Healthy vs Unhealthy Coping Skills  Participation Level:  Did Not Attend   Description of Group The focus of this group was to determine what unhealthy coping techniques typically are used by group members and what healthy coping would be helpful in coping with various problems. Patients were guided techniques in becoming aware of the differences between healthy and unhealthy coping techniques. Patients were asked to identify 2-3 healthy coping skills they would like to learn to use more effectively.     Marval Regal Conor Filsaime, LCSW 02/26/2022  4:12 PM

## 2022-02-26 NOTE — Group Note (Signed)
Recreation Therapy Group Note   Group Topic:Other  Group Date: 02/26/2022 Start Time: 1224 End Time: 1430 Facilitators: Cyrstal Leitz-McCall, LRT,CTRS Location: [address] Dayroom   Activity Description/Intervention: Therapeutic Drumming. Patients, with peers and staff, were given the opportunity to engage in a leader facilitated [place] with staff from the [employer], in partnership with [employer].    Affect/Mood: N/A   Participation Level: Did not attend    Clinical Observations/Individualized Feedback:     Plan: Continue to engage patient in RT group sessions 2-3x/week.   Burdette Forehand-McCall, LRT,CTRS 02/26/2022 3:34 PM

## 2022-02-26 NOTE — BHH Counselor (Signed)
CSW had spoken with the Pt's mother who states that he is part of a [employer].  CSW spoke with a Conservation officer, historic buildings [phone] who states that the Pt is not currently part of an ACT Team.  The representative states that the Pt participates in outpatient services for therapy and medication management.  They state that the Pt can enroll in ACT services if they are interested.  CSW spoke with the Pt about the possibility of beginning ACT services.  The Pt states that they do not want ACT services at this time.  CSW will get therapy and medication management appointments for the Pt's hospital follow up. Genene Churn is the [employer] [phone] ext: [phone].

## 2022-02-26 NOTE — Group Note (Signed)
Recreation Therapy Group Note   Group Topic:Team Building  Group Date: 02/26/2022 Start Time: 0935 End Time: 1010 Facilitators: Aijalon Kirtz-McCall, LRT,CTRS Location: [address] Dayroom   Goal Area(s) Addresses:  Patient will effectively work with peer towards shared goal.  Patient will identify skills used to make activity successful.  Patient will identify how skills used during activity can be used to reach post d/c goals.   Group Description: Straw Bridge. In teams of 3-5, patients were given 15 plastic drinking straws and an equal length of masking tape. Using the materials provided, patients were instructed to build a free standing bridge-like structure to suspend an everyday item (ex: puzzle box) off of the floor or table surface. All materials were required to be used by the team in their design. LRT facilitated post-activity discussion reviewing team process. Patients were encouraged to reflect how the skills used in this activity can be generalized to daily life post discharge.   Affect/Mood: N/A   Participation Level: Did not attend    Clinical Observations/Individualized Feedback:    Plan: Continue to engage patient in RT group sessions 2-3x/week.   Delvecchio Madole-McCall, LRT,CTRS 02/26/2022 12:47 PM

## 2022-02-26 NOTE — Progress Notes (Signed)
   02/26/22 2045  Psych Admission Type (Psych Patients Only)  Admission Status Involuntary  Psychosocial Assessment  Patient Complaints None  Eye Contact Fair  Facial Expression Flat  Affect Depressed;Flat  Speech Logical/coherent  Interaction Cautious;Minimal  Motor Activity Slow  Appearance/Hygiene Unremarkable  Behavior Characteristics Cooperative  Mood Depressed  Aggressive Behavior  Effect No apparent injury  Thought Process  Coherency WDL  Content WDL  Delusions WDL  Perception WDL  Hallucination None reported or observed  Judgment WDL  Confusion WDL  Danger to Self  Current suicidal ideation? Denies  Danger to Others  Danger to Others None reported or observed

## 2022-02-26 NOTE — Plan of Care (Signed)
  Problem: Education: Goal: Mental status will improve Outcome: Progressing   Problem: Education: Goal: Knowledge of the prescribed therapeutic regimen will improve Outcome: Progressing   Problem: Education: Goal: Ability to make informed decisions regarding treatment will improve Outcome: Progressing

## 2022-02-26 NOTE — Progress Notes (Signed)
Sleeping. Arouses easily. Encouraged patient to attend group. Declines. Request to sleep and ask,"Why are you in here talking to me?" Support given.Allow for rest. No other physical complaints . Receive recent scheduled Haldol.

## 2022-02-27 MED ORDER — ENSURE ENLIVE PO LIQD
237.0000 mL | Freq: Two times a day (BID) | ORAL | Status: DC
  Administered 2022-02-27 – 2022-03-02 (×7): 237 mL via ORAL
  Filled 2022-02-27 (×10): qty 237

## 2022-02-27 MED ORDER — NICOTINE POLACRILEX 2 MG MT GUM
2.0000 mg | CHEWING_GUM | OROMUCOSAL | Status: DC | PRN
  Administered 2022-03-02: 2 mg via ORAL

## 2022-02-27 NOTE — Plan of Care (Signed)
  Problem: Education: Goal: Knowledge of [facility] Education information/materials will improve Outcome: Progressing Goal: Emotional status will improve Outcome: Progressing Goal: Mental status will improve Outcome: Progressing Goal: Verbalization of understanding the information provided will improve Outcome: Progressing   Problem: Education: Goal: Ability to state activities that reduce stress will improve Outcome: Progressing   Problem: Education: Goal: Utilization of techniques to improve thought processes will improve Outcome: Progressing Goal: Knowledge of the prescribed therapeutic regimen will improve Outcome: Progressing   Problem: Education: Goal: Knowledge of disease or condition will improve Outcome: Progressing Goal: Understanding of discharge needs will improve Outcome: Progressing   Problem: Education: Goal: Ability to make informed decisions regarding treatment will improve Outcome: Progressing

## 2022-02-27 NOTE — Progress Notes (Signed)
   02/27/22 0515  Sleep  Number of Hours 6.5

## 2022-02-27 NOTE — Progress Notes (Addendum)
[facility] MD Progress Note  02/27/2022 10:50 AM Billy Rose  MRN:  [id number] Principal Problem: Schizoaffective disorder, bipolar type (HCC) Diagnosis: Principal Problem:   Schizoaffective disorder, bipolar type [facility])  Chief Complaint: depression  Reason for Admission: Billy Rose is a 21 y.o. male with psychiatric hx of schizoaffective disorder bipolar type, PTSD, generalized anxiety disorder, and recent admission to [facility] 2 weeks ago, who presented to [facility] voluntarily due to concerns of suicidal ideation, anxiety, and depression.The patient is currently on Hospital Day 3.   Yesterday's Psychiatry Team's Recommendation: Started Neurontin 100 mg TID for anxiety and alcohol cravings. Will not start Xanax or Ativan due to high potential for abuse. Continue Prozac 20 mg daily for depressive sx and anxiety. Continue haloperidol 10 mg twice daily as oral bridge while Haldol dec reaches steady state. Continue benztropine 0.5 mg twice daily scheduled for prophylaxis of EPS. Start CIWA with MVI, thiamine and PRN Ativan for scores >10. Contacting ACTT to see if they made contact after recent discharge.   Subjective:  Patient was seen and interviewed with attending psychiatrist. He reports that he slept well and his appetite is okay. He denies any SI/HI and AVH today. He denies feelings of paranoia, thought insertion, withdrawal, broadcasting, and denies receiving messages from electronic devices. He states his depression today is 0/10 compared to 7/10 yesterday and attributes improvement to medication. Patient is asked about incident with roommate from yesterday. He states the roommate was snoring and he asked him to not snore and the roommate "freaked out." Patient denies C/P, HA, n/v/d, and constipation. We discussed that he cannot sleep all day and needs to attend group. Patient was amenable to this and was informed of possible discharge plans for this weekend. We clarified with patient whether he wants an ACT  team or not since he originally had desired one and then changed his mind. Patient states he does not want an ACT team since they come to his house and he feels this is "intrusive." Patient states he can stay clean without the additional support. He denies medication side-effects. He denies cravings or signs of withdrawal from substances.   Objective:  Chart Review Past 24 hours of patient's chart was reviewed.  Patient was compliant with scheduled meds  Required PRNs: Atarax 25 mg at 9 pm  and PRN Trazodone at 9pm Per RN notes, no documented behavioral issues and is attending group. Patient slept, 6.5 hours  Total Time Spent in Direct Patient Care:  I personally spent 30 minutes on the unit in direct patient care. The direct patient care time included face-to-face time with the patient, reviewing the patient's chart, communicating with other professionals, and coordinating care. Greater than 50% of this time was spent in counseling or coordinating care with the patient regarding goals of hospitalization, psycho-education, and discharge planning needs.    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 No pertinent surgical history. Family History:  Family History  Problem Relation Age of Onset   Allergic rhinitis Neg Hx    Asthma Neg Hx    Eczema Neg Hx    Urticaria Neg Hx    Family Psychiatric  History: see H&P  Social History:  Social History   Substance and Sexual  Activity  Alcohol Use Yes   Comment: "4 beers every so often"     Social History   Substance and Sexual Activity  Drug Use Not Currently   Types: Cocaine, LSD, Marijuana, IV    Social History   Socioeconomic History   Marital status: Single    Spouse name: Not  on file   Number of children: Not on file   Years of education: Not on file   Highest education level: Not on file  Occupational History   Not on file  Tobacco Use   Smoking status: Every Day    Packs/day: 1.00    Types: Cigarettes   Smokeless tobacco: Never  Vaping Use   Vaping Use: Never used  Substance and Sexual Activity   Alcohol use: Yes    Comment: "4 beers every so often"   Drug use: Not Currently    Types: Cocaine, LSD, Marijuana, IV   Sexual activity: Not Currently  Other Topics Concern   Not on file  Social History Narrative   ** Merged History Encounter **       Social Determinants of Health   Financial Resource Strain: Not on file  Food Insecurity: No Food Insecurity (02/24/2022)   Hunger Vital Sign    Worried About Running Out of Food in the Last Year: Never true    Ran Out of Food in the Last Year: Never true  Transportation Needs: No Transportation Needs (02/24/2022)   PRAPARE - Administrator, Civil Service (Medical): No    Lack of Transportation (Non-Medical): No  Physical Activity: Not on file  Stress: Not on file  Social Connections: Not on file   Current Medications: Current Facility-Administered Medications  Medication Dose Route Frequency Provider Last Rate Last Admin   alum & mag hydroxide-simeth (MAALOX/MYLANTA) 200-200-20 MG/5ML suspension 30 mL  30 mL Oral Q4H PRN Thermon Leyland, NP       benztropine (COGENTIN) tablet 0.5 mg  0.5 mg Oral BID Park Pope, MD   0.5 mg at 02/27/22 0928   FLUoxetine (PROZAC) capsule 20 mg  20 mg Oral Daily Park Pope, MD   20 mg at 02/27/22 6767   gabapentin (NEURONTIN) capsule 100 mg  100 mg Oral TID Park Pope, MD   100 mg at 02/27/22 2094   haloperidol (HALDOL) tablet 10 mg  10 mg Oral BID Park Pope, MD   10 mg at 02/27/22 7096   hydrOXYzine (ATARAX) tablet 25 mg  25 mg Oral Q6H PRN Comer Locket, MD   25 mg at 02/26/22 2116   loperamide (IMODIUM) capsule 2-4 mg  2-4 mg Oral PRN Comer Locket,  MD       LORazepam (ATIVAN) tablet 1 mg  1 mg Oral Q6H PRN Comer Locket, MD       OLANZapine zydis (ZYPREXA) disintegrating tablet 5 mg  5 mg Oral Q8H PRN Comer Locket, MD       And   LORazepam (ATIVAN) tablet 1 mg  1 mg Oral PRN Comer Locket, MD       And   ziprasidone (GEODON) injection 20 mg  20 mg Intramuscular PRN Mason Jim, Osias Resnick E, MD       magnesium hydroxide (MILK OF MAGNESIA) suspension 30 mL  30 mL Oral Daily PRN Thermon Leyland, NP       multivitamin with minerals tablet 1 tablet  1 tablet Oral Daily Comer Locket, MD   1 tablet at 02/27/22  1610   ondansetron (ZOFRAN-ODT) disintegrating tablet 4 mg  4 mg Oral Q6H PRN Comer Locket, MD       thiamine (Vitamin B-1) tablet 100 mg  100 mg Oral Daily Mason Jim, Tensley Wery E, MD   100 mg at 02/27/22 9604   traZODone (DESYREL) tablet 50 mg  50 mg Oral QHS PRN Thermon Leyland, NP   50 mg at 02/26/22 2116    Lab Results:  Results for orders placed or performed during the hospital encounter of 02/24/22 (from the past 24 hour(s))  Urinalysis, Complete w Microscopic Urine, Clean Catch     Status: None   Collection Time: 02/26/22  5:04 PM  Result Value Ref Range   Color, Urine YELLOW YELLOW   APPearance CLEAR CLEAR   Specific Gravity, Urine 1.024 1.005 - 1.030   pH 5.0 5.0 - 8.0   Glucose, UA NEGATIVE NEGATIVE mg/dL   Hgb urine dipstick NEGATIVE NEGATIVE   Bilirubin Urine NEGATIVE NEGATIVE   Ketones, ur NEGATIVE NEGATIVE mg/dL   Protein, ur NEGATIVE NEGATIVE mg/dL   Nitrite NEGATIVE NEGATIVE   Leukocytes,Ua NEGATIVE NEGATIVE   RBC / HPF 0-5 0 - 5 RBC/hpf   WBC, UA 0-5 0 - 5 WBC/hpf   Bacteria, UA NONE SEEN NONE SEEN   Mucus PRESENT     Blood Alcohol level:  Lab Results  Component Value Date   ETH <10 02/26/2022   ETH <10 02/05/2022    Metabolic Disorder Labs: Lab Results  Component Value Date   HGBA1C 5.1 02/07/2022   MPG 99.67 02/07/2022   MPG 102.54 10/10/2020   Lab Results  Component Value Date   PROLACTIN  24.1 (H) 05/22/2020   PROLACTIN 32.1 (H) 02/15/2017   Lab Results  Component Value Date   CHOL 128 02/07/2022   TRIG 81 02/07/2022   HDL 51 02/07/2022   CHOLHDL 2.5 02/07/2022   VLDL 16 02/07/2022   LDLCALC 61 02/07/2022   LDLCALC 52 10/10/2020    Physical Findings: AIMS: 0, no coghweeling, no rigidity CIWA:  CIWA-Ar Total: 0  Musculoskeletal: Strength & Muscle Tone: within normal limits Gait & Station: normal  Psychiatric Specialty Exam:  Presentation  General Appearance: improved hygiene - appears to have showered; casually dressed   Eye Contact:Fair   Speech:monotone, answers direct questions with short phrases - normal fluency   Speech Volume:Normal   Mood and Affect  Mood:Described as improved - more euthymic, calm   Affect:minimally brighter today    Thought Process  Thought Processes:superficially goal directed, linear  Orientation:Full (Time, Place and Person)   Thought Content:Denies SI, HI, AVH, paranoia, delusions, ideas of reference, or first rank symptoms - is not grossly responding to stimuli on exam  Hallucinations:Denied  Ideas of Reference:None   Suicidal Thoughts:Suicidal Thoughts: No  Homicidal Thoughts:Homicidal Thoughts: No   Sensorium  Memory:Fair   Judgment:Fair - compliant with meds   Insight:Shallow    Executive Functions  Concentration:Fair   Attention Span:Fair   Recall:Fair   Fund of Knowledge:Fair   Language:Fair    Psychomotor Activity  Psychomotor Activity:No cogwheeling, no stiffness, no tremor, AIMS 0   Assets  Assets:Communication Skills; Desire for Improvement; Financial Resources/Insurance; Housing; Leisure Time; Physical Health; Resilience; Social Support; Talents/Skills   Physical Exam Vitals and nursing note reviewed.  HENT:     Head: Normocephalic.  Pulmonary:     Effort: Pulmonary effort is normal.  Neurological:     General: No focal deficit present.     Mental Status:  He is alert.     Review of Systems  Respiratory:  Negative for shortness of breath.   Cardiovascular:  Negative for chest pain.  Gastrointestinal:  Negative for diarrhea, nausea and vomiting.  Neurological:  Negative for dizziness and headaches.   Blood pressure 117/75, pulse 84, temperature 97.9 F (36.6 C), temperature source Oral, resp. rate 17, height 5\' 8"  (1.727 m), weight 56.7 kg, SpO2 99 %. Body mass index is 19.01 kg/m.   ASSESSMENT AND PLAN Billy Rose is a 21 y.o. male with psychiatric hx of schizoaffective disorder bipolar type, PTSD, generalized anxiety disorder, and recent admission to [facility] 2 weeks ago who presented to [facility] voluntarily due to concerns of suicidal ideation, anxiety, and depression.This is hospitalization day 3.  PLAN Safety and Monitoring: voluntary admission to inpatient psychiatric unit for safety, stabilization and treatment Daily contact with patient to assess and evaluate symptoms and progress in treatment Patient's case to be discussed in multi-disciplinary team meeting Observation Level : q15 minute checks Vital signs: q12 hours Precautions: suicide, elopement, and assault   Psychiatric Problems Schizoaffective, Bipolar type-currently depressed GAD w/ panic attacks - Continue Prozac 20 mg daily for depressive symptoms and anxiety  -Continue haloperidol 10 mg twice daily until next LAI injection as oral bridge while LAI reaches steady state -Patient received haloperidol decanoate 100 mg 9/13 and 2nd dose 9/15 for psychosis (next dose 200 mg due on 03/14/22) - Continue Benztropine 0.5 mg twice daily scheduled for prophylaxis of EPS  -- Encouraged patient to participate in unit milieu and in scheduled group therapies  --Patient declines wanting ACT Team support  --Patient would benefit from social support or outpatient PHP/IOP upon discharge  Alcohol Misuse (r/o alcohol use d/o) 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 by  self-report -EtOH <10 BAL,  UDS pos for tetrahydrocannabinol  -Gabapentin 100 mg 3 times daily for anxiety and alcohol cravings  - Continue CIWA with MVI, thiamine and PRN Ativan for scores >10 (recent scores 0,0)  Medical Problems Admission labs ordered and reviewed: UA pos for moderate Hgb on dipstick, CK 28, CMP WNL CBC WNL, ETOH <10, UDS positive for THC  UA with moderate Hbg on dipstick - repeat UA is negative, encouraged PO fluids    PRNs Tylenol 650 mg for mild pain Maalox/Mylanta 30 mL for indigestion Hydroxyzine 25 mg tid for anxiety Milk of Magnesia 30 mL for constipation Trazodone 50 mg for sleep   4. Discharge Planning: Social work and case management to assist with discharge planning and identification of hospital follow-up needs prior to discharge Estimated LOS: 5-7 days Discharge Concerns: Need to establish a safety plan; Medication compliance and effectiveness Discharge Goals: Return home with outpatient referrals for mental health follow-up including medication management/psychotherapy  Jerolyn Center, Medical Student 02/27/2022, 10:50 AM Attestation for Student Documentation:   I certify that I saw and interviewed the patient together with the medical student and was present for the duration of the interview.  I reviewed the medical record.  I performed or reperformed the mental status examination of the patient as indicated.  I formulated the assessment and plan of treatment as documented above with edits.  Viann Fish, MD, Alda Ponder

## 2022-02-27 NOTE — BHH Counselor (Signed)
CSW spoke with Genene Churn from [place] who states that the Pt does not have ACT services at this time.  She states that he has been declining the service at home and "barely comes to the services he has now".  She states that she is not certain that the Pt is taking his medications because she states "his mother is not there all the time when he takes his medications".  Mrs. Mason asked about the Pt possibly going to a residential facility for mental health.  CSW explained that there is not a residential center for mental health that takes Medicaid in [place].  Mrs. Cornelia Copa states that if the Pt is discharged over the weekend then she will be at the Pt's home on Monday to meet with him.  She states that she would like to be contacted when the Pt is discharged at her personal number [phone].  Mrs. Cornelia Copa states that she will be speaking with her supervisor to what other services may be available for the Pt after discharge.

## 2022-02-27 NOTE — Group Note (Signed)
Date:  02/27/2022 Time:  10:58 AM  Group Topic/Focus:  Orientation:   The focus of this group is to educate the patient on the purpose and policies of crisis stabilization and provide a format to answer questions about their admission.  The group details unit policies and expectations of patients while admitted.    Participation Level:  Did Not Attend  Participation Quality:    Affect:      Cognitive:      Insight: None  Engagement in Group:      Modes of Intervention:      Additional Comments:     Jerrye Beavers 02/27/2022, 10:58 AM

## 2022-02-27 NOTE — Group Note (Signed)
Date:  02/27/2022 Time:  10:32 AM  Group Topic/Focus:  Orientation:   The focus of this group is to educate the patient on the purpose and policies of crisis stabilization and provide a format to answer questions about their admission.  The group details unit policies and expectations of patients while admitted.    Participation Level:  Did Not Attend  Participation Quality:      Affect:      Cognitive:      Insight: None  Engagement in Group:   Modes of Intervention:      Additional Comments:     Jerrye Beavers 02/27/2022, 10:32 AM

## 2022-02-27 NOTE — Progress Notes (Signed)
Pt denies any symptoms of dizziness or any other side effects of low blood pressure. Pt was educated on fall reduction safety. Pt remains safe on Q15 min checks and contracts for safety.     02/27/22 1623  Vital Signs  Pulse Rate 62  BP (!) 100/58  BP Location Left Arm  BP Method Automatic  Patient Position (if appropriate) Sitting

## 2022-02-27 NOTE — Progress Notes (Signed)
Patient appears pleasant. Patient denies SI/HI/AVH. Pt reports good sleep and appetite. Pt is requesting a discharge date. Patient complied with morning medication with no reported side effects. Patient remains safe on Q48min checks and contracts for safety.       02/27/22 0936  Psych Admission Type (Psych Patients Only)  Admission Status Involuntary  Psychosocial Assessment  Patient Complaints None  Eye Contact Intense  Facial Expression Flat  Affect Depressed;Flat  Speech Logical/coherent  Interaction Cautious;Minimal  Motor Activity Slow  Appearance/Hygiene Unremarkable  Behavior Characteristics Cooperative  Mood Depressed  Thought Process  Coherency WDL  Content WDL  Delusions None reported or observed  Perception WDL  Hallucination None reported or observed  Judgment Impaired  Confusion None  Danger to Self  Current suicidal ideation? Denies  Agreement Not to Harm Self Yes  Description of Agreement verbal  Danger to Others  Danger to Others None reported or observed

## 2022-02-27 NOTE — Progress Notes (Signed)
Psychoeducational Group Note  Date:  02/27/2022 Time:  2139  Group Topic/Focus:  Wrap-Up Group:   The focus of this group is to help patients review their daily goal of treatment and discuss progress on daily workbooks.  Participation Level: Did Not Attend  Participation Quality:  Not Applicable  Affect:  Not Applicable  Cognitive:  Not Applicable  Insight:  Not Applicable  Engagement in Group: Not Applicable  Additional Comments:  The patient did not attend group this evening.   Gennette Pac 02/27/2022, 9:39 PM

## 2022-02-28 DIAGNOSIS — F250 Schizoaffective disorder, bipolar type: Secondary | ICD-10-CM

## 2022-02-28 NOTE — Group Note (Signed)
Date:  02/28/2022 Time:  7:09 PM  Group Topic/Focus:  Dimensions of Wellness:   The focus of this group is to introduce the topic of wellness and discuss the role each dimension of wellness plays in total health.    Participation Level:  Did Not Attend  Participation Quality:      Affect:      Cognitive:      Insight: None  Engagement in Group:  Defensive and    Modes of Intervention:      Additional Comments:   Patient did not attend this group.  Jerrye Beavers 02/28/2022, 7:09 PM

## 2022-02-28 NOTE — Progress Notes (Signed)
Pt isolative to room, prompted multiple times to come to med window for meds. Pt encouraged to go to group. Pt rates depression 0/10 and anxiety 0/10. Pt reports a good appetite, and no physical problems. Pt denies SI/HI/AVH and verbally contracts for safety. Provided support and encouragement. Pt safe on the unit. Q 15 minute safety checks continued.

## 2022-02-28 NOTE — Group Note (Signed)
LCSW Group Therapy Note   Group Date: 02/28/2022 Start Time: 1300 End Time: 1400  Type of Therapy and Topic:  Group Therapy:  Stress Management   Participation Level:  Active    Description of Group:  Patients in this group were introduced to the idea of stress and encouraged to discuss negative and positive ways to manage stress. Patients discussed specific stressors that they have in their life right now and the physical signs and symptoms associated with that stress.  Patient encouraged to come up with positive changes to assist with the stress upon discharge in order to prevent future hospitalizations.   They also worked as a group on developing a specific plan for several patients to deal with stressors through boundary-setting, psychoeducation and self-care techniques   Therapeutic Goals:               1)  To discuss the positive and negative impacts of stress             2)  identify signs and symptoms of stress             3)  generate ideas for stress management             4)  offer mutual support to others regarding stress management             5)  Developing plans for ways to manage specific stressors upon discharge               Summary of Patient Progress: The Pt attended group and remained there the entire time.  The Pt accepted all worksheets and participated in the group discussion.  The Pt was appropriate with their peers and was able to share examples of stress in their own life.  The Pt was able to find new ways of managing their stress and having a different perspective about things that cause them stress.    Therapeutic Modalities:   Motivational Interviewing Brief Solution-Focused Therapy  Marcel Gary M Makenzie Vittorio, LCSWA 02/28/2022  1:58 PM    

## 2022-02-28 NOTE — Group Note (Signed)
Recreation Therapy Group Note   Group Topic:Healthy Decision Making  Group Date: 02/28/2022 Start Time: 0930 End Time: 1000 Facilitators: Mitsue Peery-McCall, LRT,CTRS Location: [address] Dayroom   Goal Area(s) Addresses:  Patient will effectively work with peer towards shared goal.  Patient will identify factors that guided their decision making.  Patient will pro-socially communicate ideas during group session.   Group Description:  Patients were given a scenario that they were going to be stranded on a deserted [place] for several months before being rescued. Writer tasked them with making a list of 15 things they would choose to bring with them for "survival". The list of items was prioritized most important to least. Each patient would come up with their own list, then work together to create a new list of 15 items while in a group of 3-5 peers. LRT discussed each person's list and how it differed from others. The debrief included discussion of priorities, good decisions versus bad decisions, and how it is important to think before acting so we can make the best decision possible. LRT tied the concept of effective communication among group members to patient's support systems outside of the hospital and its benefit post discharge.   Affect/Mood: N/A   Participation Level: Did not attend    Clinical Observations/Individualized Feedback:     Plan: Continue to engage patient in RT group sessions 2-3x/week.   Billy Rose, LRT,CTRS  02/28/2022 12:34 PM

## 2022-02-28 NOTE — Progress Notes (Signed)
   02/28/22 0100  Psych Admission Type (Psych Patients Only)  Admission Status Involuntary  Psychosocial Assessment  Patient Complaints None  Eye Contact Fair  Facial Expression Flat  Affect Anxious  Speech Logical/coherent  Interaction Minimal  Motor Activity Slow  Appearance/Hygiene Unremarkable  Behavior Characteristics Cooperative  Mood Depressed  Thought Process  Coherency WDL  Content WDL  Delusions None reported or observed  Perception WDL  Hallucination None reported or observed  Judgment WDL  Confusion None  Danger to Self  Current suicidal ideation? Denies  Agreement Not to Harm Self Yes  Description of Agreement verbal

## 2022-02-28 NOTE — Progress Notes (Addendum)
[facility] MD Progress Note  02/28/2022 10:18 AM Billy Rose  MRN:  [id number] Principal Problem: Schizoaffective disorder, bipolar type ([place]) Diagnosis: Principal Problem:   Schizoaffective disorder, bipolar type [facility])  Chief Complaint: depression  Reason for Admission: Billy Rose is a 21 y.o. male with psychiatric hx of schizoaffective disorder bipolar type, PTSD, generalized anxiety disorder, and recent admission to [facility] 2 weeks ago, who presented to [facility] voluntarily due to concerns of suicidal ideation, anxiety, and depression.The patient is currently on Hospital Day 4.   Yesterday's Psychiatry Team's Recommendation: Continue Neurontin 100 mg TID for anxiety and alcohol cravings. Will not start Xanax or Ativan due to high potential for abuse. Continue Prozac 20 mg daily for depressive sx and anxiety. Continue haloperidol 10 mg twice daily as oral bridge while Haldol dec reaches steady state. Continue benztropine 0.5 mg twice daily scheduled for prophylaxis of EPS. Continue CIWA with MVI, thiamine and PRN Ativan for scores >10.   Subjective:  Patient was seen and interviewed with attending psychiatrist. He reports that he slept fine and his appetite is okay. He denies any SI/HI and AVH today. He denies feelings of paranoia, thought insertion, withdrawal, broadcasting, and denies receiving messages from electronic devices. He states his depression and anxiety today is 0/10 and attributes improvement to medication.  Patient denies C/P, HA, n/v/d, and constipation. We discussed his recurrent self-isolation as part of his depression and need to attend group and shower prior to finalizing discharge. Patient states he did not attend group yesterday because he "did not feel like it." We clarified with patient whether he wants an ACT team and he still denies wanting the additional support.He also declines PHP/IOP referral. Patient states he will not use marijuana or alcohol after discharging. He denies medication  side-effects. He denies cravings or signs of withdrawal from substances. Patient reports having no siblings or friends, but has cat who he relies on for support. We discussed goals of showering and attending groups today.   Objective:  Chart Review Past 24 hours of patient's chart was reviewed.  Patient was compliant with scheduled meds  Required PRNs: Atarax 25 mg at 9 pm  and PRN Trazodone at 9pm Per RN notes, no documented behavioral issues and is not attending group. Sleep total not documented  Total Time Spent in Direct Patient Care:  I personally spent 30 minutes on the unit in direct patient care. The direct patient care time included face-to-face time with the patient, reviewing the patient's chart, communicating with other professionals, and coordinating care. Greater than 50% of this time was spent in counseling or coordinating care with the patient regarding goals of hospitalization, psycho-education, and discharge planning needs.    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 No pertinent surgical history. Family History:  Family History  Problem Relation Age of Onset   Allergic rhinitis Neg Hx    Asthma Neg Hx    Eczema Neg Hx    Urticaria Neg Hx    Family Psychiatric  History: see H&P  Social History:  Social History   Substance and Sexual Activity  Alcohol Use Yes   Comment: "4 beers every so often"     Social History   Substance and Sexual Activity  Drug Use Not Currently   Types: Cocaine, LSD, Marijuana, IV    Social History   Socioeconomic History   Marital status: Single    Spouse name: Not on file   Number of children: Not on file   Years of education: Not on file   Highest education level: Not on  file  Occupational History   Not on file  Tobacco Use   Smoking status: Every Day    Packs/day: 1.00    Types: Cigarettes   Smokeless tobacco: Never  Vaping Use   Vaping Use: Never used  Substance and Sexual Activity   Alcohol use: Yes    Comment: "4 beers every so often"   Drug use: Not Currently    Types: Cocaine, LSD, Marijuana, IV   Sexual activity: Not Currently  Other Topics Concern   Not on file  Social History Narrative   ** Merged History Encounter **       Social Determinants of Health   Financial Resource Strain: Not on file  Food Insecurity: No Food Insecurity (02/24/2022)   Hunger Vital Sign    Worried About Running Out of Food in the Last Year: Never true    Ran Out of Food in the Last Year: Never true  Transportation Needs: No Transportation Needs (02/24/2022)   PRAPARE - Hydrologist (Medical): No    Lack of Transportation (Non-Medical): No  Physical Activity: Not on file  Stress: Not on file  Social Connections: Not on file   Current Medications: Current Facility-Administered Medications  Medication Dose Route Frequency Provider Last Rate Last Admin   alum & mag hydroxide-simeth (MAALOX/MYLANTA) 200-200-20 MG/5ML suspension 30 mL  30 mL Oral Q4H PRN Niel Hummer, NP       benztropine (COGENTIN) tablet 0.5 mg  0.5 mg Oral BID France Ravens, MD   0.5 mg at 02/28/22 0939   feeding supplement (ENSURE ENLIVE / ENSURE PLUS) liquid 237 mL  237 mL Oral BID BM Nelda Marseille, Leah Thornberry E, MD   237 mL at 02/28/22 0941   FLUoxetine (PROZAC) capsule 20 mg  20 mg Oral Daily France Ravens, MD   20 mg at 02/28/22 3474   gabapentin (NEURONTIN) capsule 100 mg  100 mg Oral TID France Ravens, MD   100 mg at 02/28/22 2595   haloperidol (HALDOL) tablet 10 mg  10 mg Oral BID France Ravens, MD   10 mg at 02/28/22 6387   hydrOXYzine (ATARAX) tablet 25 mg  25 mg Oral Q6H PRN Harlow Asa, MD   25 mg at 02/27/22 2129   loperamide (IMODIUM) capsule 2-4 mg  2-4 mg Oral  PRN Harlow Asa, MD       LORazepam (ATIVAN) tablet 1 mg  1 mg Oral Q6H PRN Harlow Asa, MD       OLANZapine zydis (ZYPREXA) disintegrating tablet 5 mg  5 mg Oral Q8H PRN Harlow Asa, MD       And   LORazepam (ATIVAN) tablet 1 mg  1 mg Oral PRN Harlow Asa, MD       And   ziprasidone (GEODON) injection 20 mg  20 mg Intramuscular PRN Nelda Marseille, Satonya Lux E, MD       magnesium hydroxide (MILK OF MAGNESIA) suspension 30 mL  30 mL Oral Daily PRN Niel Hummer, NP       multivitamin with minerals tablet 1 tablet  1 tablet Oral Daily Harlow Asa, MD   1  tablet at 02/28/22 [id number]   nicotine polacrilex (NICORETTE) gum 2 mg  2 mg Oral PRN Harlow Asa, MD       ondansetron (ZOFRAN-ODT) disintegrating tablet 4 mg  4 mg Oral Q6H PRN Harlow Asa, MD       thiamine (Vitamin B-1) tablet 100 mg  100 mg Oral Daily Nelda Marseille, Shritha Bresee E, MD   100 mg at 02/28/22 0939   traZODone (DESYREL) tablet 50 mg  50 mg Oral QHS PRN Niel Hummer, NP   50 mg at 02/27/22 2129    Lab Results:  No results found for this or any previous visit (from the past 24 hour(s)).   Blood Alcohol level:  Lab Results  Component Value Date   ETH <10 02/26/2022   ETH <10 123456    Metabolic Disorder Labs: Lab Results  Component Value Date   HGBA1C 5.1 02/07/2022   MPG 99.67 02/07/2022   MPG 102.54 10/10/2020   Lab Results  Component Value Date   PROLACTIN 24.1 (H) 05/22/2020   PROLACTIN 32.1 (H) 02/15/2017   Lab Results  Component Value Date   CHOL 128 02/07/2022   TRIG 81 02/07/2022   HDL 51 02/07/2022   CHOLHDL 2.5 02/07/2022   VLDL 16 02/07/2022   LDLCALC 61 02/07/2022   LDLCALC 52 10/10/2020    Physical Findings: AIMS: 0, no coghweeling, no rigidity on 9/28 CIWA:  CIWA-Ar Total: 0  Musculoskeletal: Strength & Muscle Tone: within normal limits Gait & Station: normal  Psychiatric Specialty Exam:  Presentation  General Appearance: has not showered and unkempt appearing;  casually dressed   [place], answers direct questions with short phrases - normal fluency   Speech Volume:Normal   Mood and Affect  Mood:Described as fine - appears calm and euthmic  Affect: restricted    Thought Process  Thought Processes:superficially goal directed, linear but ruminative at times on discharge planning  Orientation:Full (Time, Place and Person)   Thought Content:Denies SI, HI, AVH, paranoia, delusions, ideas of reference, or first rank symptoms - is not grossly responding to stimuli on exam  Hallucinations:Denied  Ideas of Reference:None   Suicidal Thoughts:Denied  Homicidal Thoughts:Denied   Sensorium  Memory:Fair   Judgment:Fair - compliant with meds   Insight:Shallow    Executive Functions  Concentration:Fair   Attention Span:Fair   [place]    Psychomotor Activity  Psychomotor Activity:Normal - no akathisias or tremors noted   Assets  Assets:Communication Skills; Desire for Improvement; Financial Resources/Insurance; Housing; Leisure Time; Physical Health; Resilience; Social Support; Talents/Skills   Physical Exam Vitals and nursing note reviewed.  HENT:     Head: Normocephalic.  Pulmonary:     Effort: Pulmonary effort is normal.  Neurological:     General: No focal deficit present.     Mental Status: He is alert.     Review of Systems  Respiratory:  Negative for shortness of breath.   Cardiovascular:  Negative for chest pain.  Gastrointestinal:  Negative for diarrhea, nausea and vomiting.  Neurological:  Negative for dizziness and headaches.   Blood pressure 109/61, pulse 60, temperature 97.9 F (36.6 C), temperature source Oral, resp. rate 17, height 5\' 8"  (1.727 m), weight 56.7 kg, SpO2 100 %. Body mass index is 19.01 kg/m.   ASSESSMENT AND PLAN Billy Rose is a 21 y.o. male with psychiatric hx of schizoaffective disorder  bipolar type, PTSD, generalized anxiety disorder, and recent admission to [facility] 2  weeks ago who presented to [facility] voluntarily due to concerns of suicidal ideation, anxiety, and depression.This is hospitalization day 4.  PLAN Safety and Monitoring: voluntary admission to inpatient psychiatric unit for safety, stabilization and treatment Daily contact with patient to assess and evaluate symptoms and progress in treatment Patient's case to be discussed in multi-disciplinary team meeting Observation Level : q15 minute checks Vital signs: q12 hours Precautions: suicide, elopement, and assault Placed on room lock out for meals, snacks and groups   Psychiatric Problems Schizoaffective, Bipolar type-currently depressed GAD w/ panic attacks - Continue Prozac 20 mg daily for depressive symptoms and anxiety  -Continue haloperidol 10 mg twice daily until next LAI injection as oral bridge while LAI reaches steady state -Patient received haloperidol decanoate 100 mg 9/13 and 2nd dose 9/15 for psychosis (next dose 200 mg due on 03/14/22) - Continue Benztropine 0.5 mg twice daily scheduled for prophylaxis of EPS - monitoring bowel function on Cogentin -- Encouraged patient to participate in unit milieu and in scheduled group therapies  --Patient declines wanting ACT Team support  --Patient declines PHP/IOP after discharge  Alcohol Misuse (r/o alcohol use d/o) 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 by self-report -EtOH <10 BAL,  UDS pos for tetrahydrocannabinol  -Gabapentin 100 mg 3 times daily for anxiety and alcohol cravings  - Continue CIWA with MVI, thiamine and PRN Ativan for scores >10 (recent scores 0,0)  Medical Problems Admission labs ordered and reviewed: UA pos for moderate Hgb on dipstick, CK 28, CMP WNL CBC WNL, ETOH <10, UDS positive for THC  UA with moderate Hbg on dipstick- resolved - repeat UA is negative, encouraged PO fluids    PRNs Tylenol 650 mg for mild  pain Maalox/Mylanta 30 mL for indigestion Hydroxyzine 25 mg tid for anxiety Milk of Magnesia 30 mL for constipation Trazodone 50 mg for sleep   4. Discharge Planning: Social work and case management to assist with discharge planning and identification of hospital follow-up needs prior to discharge Anticipate weekend discharge if he can improve in participation in milieu and attention to ADLs Discharge Concerns: Need to establish a safety plan; Medication compliance and effectiveness Discharge Goals: Return home with outpatient referrals for mental health follow-up including medication management/psychotherapy  Jerolyn Center, Medical Student 02/28/2022, 10:18 AM Attestation for Student Documentation:  I certify that I saw and interviewed the patient together with the medical student and was present for the duration of the interview.  I reviewed the medical record.  I performed or reperformed the mental status examination of the patient as indicated.  I formulated the assessment and plan of treatment as documented above with edits.  Viann Fish, MD, Alda Ponder

## 2022-03-01 NOTE — [facility] Group Notes (Signed)
[place] Group Notes:  (Nursing/MHT/Case Management/Adjunct)  Date:  03/01/2022  Time:  9:43 AM  Type of Therapy:   Goals/ Orientation   Participation Level:  Did Not Attend  Participation Quality:      Affect:      Cognitive:      Insight:  None  Engagement in Group:      Modes of Intervention:      Summary of Progress/Problems: pt. Did not attend.   Bertram Savin 03/01/2022, 9:43 AM

## 2022-03-01 NOTE — Group Note (Signed)
Date:  03/01/2022 Time:  4:02 PM  Group Topic/Focus:  Anger Management  Additional Comments:  Pt was provided with anger management materials and encouraged to come to staff with any questions.   Ellen Mayol J Mitzie Marlar 03/01/2022, 4:02 PM  

## 2022-03-01 NOTE — Plan of Care (Signed)
  Problem: Education: Goal: Ability to state activities that reduce stress will improve Outcome: Progressing   Problem: Education: Goal: Utilization of techniques to improve thought processes will improve Outcome: Progressing   Problem: Education: Goal: Knowledge of the prescribed therapeutic regimen will improve Outcome: Progressing

## 2022-03-01 NOTE — Progress Notes (Signed)
   03/01/22 0926  Psych Admission Type (Psych Patients Only)  Admission Status Voluntary  Psychosocial Assessment  Patient Complaints None  Eye Contact Fair  Facial Expression Fixed smile  Affect Appropriate to circumstance  Speech Logical/coherent  Interaction Minimal  Motor Activity Other (Comment) (WNL)  Appearance/Hygiene Unremarkable  Behavior Characteristics Cooperative;Calm  Mood Pleasant  Thought Process  Coherency WDL  Content WDL  Delusions None reported or observed  Perception WDL  Hallucination None reported or observed  Judgment WDL  Confusion None  Danger to Self  Current suicidal ideation? Denies  Agreement Not to Harm Self Yes  Description of Agreement verbally contracts for safety  Danger to Others  Danger to Others None reported or observed

## 2022-03-01 NOTE — [facility] Group Notes (Signed)
[place] Group Notes:  (Nursing/MHT/Case Management/Adjunct)  Date:  03/01/2022  Time:  10:40 PM  Type of Therapy:  Psychoeducational Skills  Participation Level:  Did Not Attend=DNA  Participation Quality:   DNA  Affect:   DNA  Cognitive:   DNA  Insight:  None  Engagement in Group:   DNA  Modes of Intervention:   DNA  Summary of Progress/Problems:  Billy Rose 03/01/2022, 10:40 PM

## 2022-03-01 NOTE — Group Note (Signed)
LCSW Group Therapy Note  03/01/2022    10:00-11:00am   Type of Therapy and Topic:  Group Therapy: Early Messages Received About Anger  Participation Level:  Did Not Attend   Description of Group:   In this group, patients shared and discussed the early messages received in their lives about anger through parental or other adult modeling, teaching, repression, punishment, violence, and more.  Participants identified how those childhood lessons influence even now how they usually or often react when angered.  The group discussed that anger is a secondary emotion and what may be the underlying emotional themes that come out through anger outbursts or that are ignored through anger suppression.    Therapeutic Goals: Patients will identify one or more childhood message about anger that they received and how it was taught to them. Patients will discuss how these childhood experiences have influenced and continue to influence their own expression or repression of anger even today. Patients will explore possible primary emotions that tend to fuel their secondary emotion of anger. Patients will learn that anger itself is normal and cannot be eliminated, and that healthier coping skills can assist with resolving conflict rather than worsening situations.  Summary of Patient Progress:  The patient did not attend this group.  Therapeutic Modalities:   Cognitive Behavioral Therapy Motivation Interviewing  Billy Rose, [place] 03/01/2022 11:28 AM

## 2022-03-01 NOTE — Progress Notes (Signed)
[facility] MD Progress Note  03/01/2022 11:42 AM Billy Rose  MRN:  [id number]  Principal Problem: Schizoaffective disorder, bipolar type (HCC) Diagnosis: Principal Problem:   Schizoaffective disorder, bipolar type [facility])  Patient is a  21y.o. male with psychiatric hx of schizoaffective disorder bipolar type, PTSD, generalized anxiety disorder, and recent admission to [facility] 2 weeks ago, who presented to [facility] voluntarily due to concerns of suicidal ideation, anxiety, and depression.   Interval History Patient was seen today for re-evaluation.  Nursing reports no events overnight. The patient has no issues with performing ADLs.  Patient has been medication compliant. Patient admitted al least one group and was visible in community areas.  Subjective:  On assessment patient reports "I am doing pretty good" and denies any complaints. He states he attended groups yesterday, took a shower. He reports good mood, denies feeling depressed, anxious, panicky. He reports good sleep and appetite. He Denies any symptoms of psychosis - denies auditory or visual hallucinations, denies feeling paranoid, unsafe, does not express any delusions. He denies thoughts or plans of hurting self or others. He reports no side effects from medications he is getting here. He continues to be focused on discharge home. He identifies his mother as his main support; he also mentioned his cat. He continues to express no interest in ACT team or PHP/IOP referral.  Labs: no new results for review.    Total Time spent with patient:  I personally spent 30 minutes on the unit in direct patient care. The direct patient care time included face-to-face time with the patient, reviewing the patient's chart, communicating with other professionals, and coordinating care. Greater than 50% of this time was spent in counseling or coordinating care with the patient regarding goals of hospitalization, psycho-education, and discharge planning needs.  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 No pertinent surgical history. Family History:  Family History  Problem Relation Age of Onset   Allergic rhinitis Neg Hx    Asthma Neg Hx    Eczema Neg Hx    Urticaria Neg Hx    Family Psychiatric  History: see H&P Social History:  Social History   Substance and Sexual Activity  Alcohol Use Yes   Comment: "4 beers every so often"     Social History   Substance and Sexual Activity  Drug Use Not Currently   Types: Cocaine, LSD, Marijuana, IV    Social History   Socioeconomic History   Marital status: Single    Spouse name: Not on file   Number of children: Not on file   Years of education: Not on file   Highest education level: Not on file  Occupational History   Not on file  Tobacco Use   Smoking status: Every Day    Packs/day: 1.00    Types: Cigarettes   Smokeless tobacco: Never  Vaping Use   Vaping Use: Never used  Substance and Sexual Activity   Alcohol use: Yes    Comment: "4 beers every so often"   Drug use: Not Currently    Types: Cocaine, LSD, Marijuana, IV   Sexual activity: Not Currently  Other Topics Concern   Not on file  Social History Narrative   ** Merged History Encounter **  Social Determinants of Health   Financial Resource Strain: Not on file  Food Insecurity: No Food Insecurity (02/24/2022)   Hunger Vital Sign    Worried About Running Out of Food in the Last Year: Never true    Ran Out of Food in the Last Year: Never true  Transportation Needs: No Transportation Needs (02/24/2022)   PRAPARE - Hydrologist (Medical): No    Lack of Transportation (Non-Medical): No  Physical Activity: Not on file  Stress: Not on file   Social Connections: Not on file   Additional Social History:                         Sleep: Fair  Appetite:  Good  Current Medications: Current Facility-Administered Medications  Medication Dose Route Frequency Provider Last Rate Last Admin   alum & mag hydroxide-simeth (MAALOX/MYLANTA) 200-200-20 MG/5ML suspension 30 mL  30 mL Oral Q4H PRN Niel Hummer, NP       benztropine (COGENTIN) tablet 0.5 mg  0.5 mg Oral BID France Ravens, MD   0.5 mg at 03/01/22 0920   feeding supplement (ENSURE ENLIVE / ENSURE PLUS) liquid 237 mL  237 mL Oral BID BM Nelda Marseille, Amy E, MD   237 mL at 03/01/22 4332   FLUoxetine (PROZAC) capsule 20 mg  20 mg Oral Daily France Ravens, MD   20 mg at 03/01/22 0920   gabapentin (NEURONTIN) capsule 100 mg  100 mg Oral TID France Ravens, MD   100 mg at 03/01/22 0920   haloperidol (HALDOL) tablet 10 mg  10 mg Oral BID France Ravens, MD   10 mg at 03/01/22 0920   OLANZapine zydis (ZYPREXA) disintegrating tablet 5 mg  5 mg Oral Q8H PRN Harlow Asa, MD       And   LORazepam (ATIVAN) tablet 1 mg  1 mg Oral PRN Harlow Asa, MD       And   ziprasidone (GEODON) injection 20 mg  20 mg Intramuscular PRN Nelda Marseille, Amy E, MD       magnesium hydroxide (MILK OF MAGNESIA) suspension 30 mL  30 mL Oral Daily PRN Niel Hummer, NP       multivitamin with minerals tablet 1 tablet  1 tablet Oral Daily Viann Fish E, MD   1 tablet at 03/01/22 0920   nicotine polacrilex (NICORETTE) gum 2 mg  2 mg Oral PRN Harlow Asa, MD       thiamine (Vitamin B-1) tablet 100 mg  100 mg Oral Daily Nelda Marseille, Amy E, MD   100 mg at 03/01/22 0920   traZODone (DESYREL) tablet 50 mg  50 mg Oral QHS PRN Niel Hummer, NP   50 mg at 02/28/22 2052    Lab Results: No results found for this or any previous visit (from the past 38 hour(s)).  Blood Alcohol level:  Lab Results  Component Value Date   ETH <10 02/26/2022   ETH <10 95/18/8416    Metabolic Disorder Labs: Lab Results   Component Value Date   HGBA1C 5.1 02/07/2022   MPG 99.67 02/07/2022   MPG 102.54 10/10/2020   Lab Results  Component Value Date   PROLACTIN 24.1 (H) 05/22/2020   PROLACTIN 32.1 (H) 02/15/2017   Lab Results  Component Value Date   CHOL 128 02/07/2022   TRIG 81 02/07/2022   HDL 51 02/07/2022   CHOLHDL 2.5 02/07/2022   VLDL 16 02/07/2022  LDLCALC 61 02/07/2022   LDLCALC 52 10/10/2020    Physical Findings: AIMS: Facial and Oral Movements Muscles of Facial Expression: None, normal Lips and Perioral Area: None, normal Jaw: None, normal Tongue: None, normal,Extremity Movements Upper (arms, wrists, hands, fingers): None, normal Lower (legs, knees, ankles, toes): None, normal, Trunk Movements Neck, shoulders, hips: None, normal, Overall Severity Severity of abnormal movements (highest score from questions above): None, normal Incapacitation due to abnormal movements: None, normal Patient's awareness of abnormal movements (rate only patient's report): No Awareness, Dental Status Current problems with teeth and/or dentures?: No Does patient usually wear dentures?: No  CIWA:  CIWA-Ar Total: 0 COWS:     Musculoskeletal: Strength & Muscle Tone: within normal limits Gait & Station: normal Patient leans: N/A  Psychiatric Specialty Exam:  Appearance:  CM, appearing stated age, wearing appropriate to situation casual clothes, with fair grooming and hygiene. Normal level of alertness and appropriate facial expression.  Attitude/Behavior: calm, cooperative, engaging with appropriate eye contact.  Motor: WNL; dyskinesias not evident. Gait appears in full range.  Speech: spontaneous, clear, coherent, normal comprehension.  Mood: euthymic, " good".  Affect: appropriately-reactive,  restricted.  Thought process: patient appears coherent, goal-directed, linear.  Thought content: patient denies suicidal thoughts, denies homicidal thoughts; did not express any delusions.  Thought  perception: patient denies auditory and visual hallucinations, no illusions, no depersonalizations. Did not appear internally stimulated.  Cognition: patient is alert and oriented in self, place, date.  Insight: limited  Judgement: fair - compliant with medications, attended group.    Assets  Assets: Manufacturing systems engineer; Desire for Improvement; Financial Resources/Insurance; Housing; Leisure Time; Physical Health; Resilience; Social Support; Talents/Skills   Sleep  Sleep:No data recorded   Physical Exam Vitals and nursing note reviewed.  HENT:     Head: Normocephalic.  Pulmonary:     Effort: Pulmonary effort is normal.  Neurological:     General: No focal deficit present.     Mental Status: He is alert.   Review of Systems  Respiratory:  Negative for shortness of breath.   Cardiovascular:  Negative for chest pain.  Gastrointestinal:  Negative for diarrhea, nausea and vomiting.  Neurological:  Negative for dizziness and headaches.   Blood pressure 99/68, pulse 98, temperature 97.9 F (36.6 C), temperature source Oral, resp. rate 18, height 5\' 8"  (1.727 m), weight 56.7 kg, SpO2 97 %. Body mass index is 19.01 kg/m.   Treatment Plan Summary: Daily contact with patient to assess and evaluate symptoms and progress in treatment and Medication management  Patient is a 21 year old male with the above-stated past psychiatric history who is seen in follow-up.  Chart reviewed. Patient discussed with nursing. Patient appears stable mentally. No unsafe thoughts or behaviors. Trying to participate in social activities. Compliant with medications. No changes in medicines made today.    Plan:  1.continue inpatient psych admission; 15-minute checks; daily contact with patient to assess and evaluate symptoms and progress in treatment; psychoeducation.  Psychiatric Problems Schizoaffective, Bipolar type-currently depressed GAD w/ panic attacks - Continue Prozac 20 mg daily for  depressive symptoms and anxiety  -Continue haloperidol 10 mg twice daily until next LAI injection as oral bridge while LAI reaches steady state -Patient received haloperidol decanoate 100 mg 9/13 and 2nd dose 9/15 for psychosis (next dose 200 mg due on 03/14/22) - Continue Benztropine 0.5 mg twice daily scheduled for prophylaxis of EPS - monitoring bowel function on Cogentin -- Encouraged patient to participate in unit milieu and in scheduled group therapies  --  Patient declines wanting ACT Team support  --Patient declines PHP/IOP after discharge   Alcohol Misuse (r/o alcohol use d/o) 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 by self-report -EtOH <10 BAL,  UDS pos for tetrahydrocannabinol  -Gabapentin 100 mg 3 times daily for anxiety and alcohol cravings  - Continue CIWA with MVI, thiamine and PRN Ativan for scores >10 (recent scores 0,0)   Medical Problems Admission labs ordered and reviewed: UA pos for moderate Hgb on dipstick, CK 28, CMP WNL CBC WNL, ETOH <10, UDS positive for THC   UA with moderate Hbg on dipstick- resolved - repeat UA is negative, encouraged PO fluids     PRNs Tylenol 650 mg for mild pain Maalox/Mylanta 30 mL for indigestion Hydroxyzine 25 mg tid for anxiety Milk of Magnesia 30 mL for constipation Trazodone 50 mg for sleep   -Consults: No new consults placed since yesterday    -Disposition:  Social work and case management to assist with discharge planning and identification of hospital follow-up needs prior to discharge Anticipate weekend discharge if he can improve in participation in milieu and attention to ADLs Discharge Concerns: Need to establish a safety plan; Medication compliance and effectiveness Discharge Goals: Return home with outpatient referrals for mental health follow-up including medication management/psychotherapy.   Thalia Party, MD 03/01/2022, 11:42 AM

## 2022-03-02 MED ORDER — FLUOXETINE HCL 20 MG PO CAPS: 20.0000 mg | ORAL_CAPSULE | Freq: Every day | ORAL | 0 refills | Status: DC

## 2022-03-02 MED ORDER — BENZTROPINE MESYLATE 0.5 MG PO TABS: 0.5000 mg | ORAL_TABLET | Freq: Two times a day (BID) | ORAL | 0 refills | Status: DC

## 2022-03-02 MED ORDER — TRAZODONE HCL 50 MG PO TABS: 50.0000 mg | ORAL_TABLET | Freq: Every evening | ORAL | 0 refills | Status: DC | PRN

## 2022-03-02 MED ORDER — ADULT MULTIVITAMIN W/MINERALS CH: 1.0000 | ORAL_TABLET | Freq: Every day | ORAL | 0 refills | Status: AC

## 2022-03-02 MED ORDER — GABAPENTIN 100 MG PO CAPS: 100.0000 mg | ORAL_CAPSULE | Freq: Three times a day (TID) | ORAL | 0 refills | Status: DC

## 2022-03-02 MED ORDER — VITAMIN B-1 100 MG PO TABS: 100.0000 mg | ORAL_TABLET | Freq: Every day | ORAL | 0 refills | Status: AC

## 2022-03-02 NOTE — Progress Notes (Signed)
Patient has made several attempts to reach his mother, with no success. Patient unable to leave a voicemail either because her mailbox is full.

## 2022-03-02 NOTE — BHH Suicide Risk Assessment (Signed)
[facility] Discharge Suicide Risk Assessment   Principal Problem: Schizoaffective disorder, bipolar type [facility]) Discharge Diagnoses: Principal Problem:   Schizoaffective disorder, bipolar type ([place])   Total Time spent with patient: 30 minutes Mental Status Per Nursing Assessment::   On Admission:  NA  Demographic Factors:  Male, Adolescent or young adult, Caucasian, and Unemployed  Loss Factors: Decrease in vocational status  Historical Factors: NA  Risk Reduction Factors:   Living with another person, especially a relative, Positive social support, Positive therapeutic relationship, and Positive coping skills or problem solving skills  Continued Clinical Symptoms:  Previous Psychiatric Diagnoses and Treatments  Cognitive Features That Contribute To Risk:  None    Suicide Risk:  Mild:  Suicidal ideation of limited frequency, intensity, duration, and specificity.  There are no identifiable plans, no associated intent, mild dysphoria and related symptoms, good self-control (both objective and subjective assessment), few other risk factors, and identifiable protective factors, including available and accessible social support.    [place] Suicide Severity Rating Scale  Wish to be dead: No  Suicidal thoughts: No                  Suicidal thoughts with method: No                  Suicidal intent: No                  Suicide intent with specific plan: No                  Suicide behavior: No    Follow-up Information     [facility]. Go on 03/12/2022.   Why: You have a hospital follow up appointment for therapy and medication management services on 03/12/22 at 3:30 pm.  This appointment will be held in person. Contact information: [address]  [place] [address] [phone]         [facility] Follow up.   Why: You may use this agency for support group services. Contact information: Address: [facility], [place], [place] [address]  Phone: [phone] Email: [email or web address]        [facility] Follow up.   Why: You may use this agency for support group services. Contact information: Address: [address] [place], [place], [place] [address]  Phone: [phone]                Plan Of Care/Follow-up recommendations:  Emergency Contact Plan: Patient understands to call Suicide Hotline at 74, or Mobile Crisis at [phone]) 718-0483, call 911, or go to the nearest ER as appropriate in case of thoughts of harm to self or others, or acute onset of intolerable side effects.   Larita Fife, MD 03/02/2022, 9:42 AM

## 2022-03-02 NOTE — Progress Notes (Addendum)
D. Pt presented with a bright affect upon initial approach-Pt reported that he was looking forward to being discharged today and being able to play with his cat. Pt has been visible in the milieu . Pt currently denies SI/HI and AVH and does not appear to be responding to internal stimuli.  A. Labs and vitals monitored. Pt given and educated on medications. Pt supported emotionally and encouraged to express concerns and ask questions.   R. Pt remains safe with 15 minute checks. Will continue POC.

## 2022-03-02 NOTE — Progress Notes (Signed)
Pt discharged to lobby- left with mother. Pt was stable and appreciative at that time. All papers and electronic prescriptions were given and valuables returned. Verbal understanding expressed. Denies SI/HI and A/VH. Pt given opportunity to express concerns and ask questions.

## 2022-03-02 NOTE — Progress Notes (Signed)
  [facility] Adult Case Management Discharge Plan :  Will you be returning to the same living situation after discharge:  Yes,  Patient to return to place of residence. At discharge, do you have transportation home?: Yes,  Patient's mother to assist with transportation from hospital.  Do you have the ability to pay for your medications: Yes,  [facility]    Release of information consent forms completed and in the chart;  Patient's signature needed at discharge.  Patient to Follow up at:  Follow-up Information     [facility]. Go on 03/12/2022.   Why: You have a hospital follow up appointment for therapy and medication management services on 03/12/22 at 3:30 pm.  This appointment will be held in person. Contact information: [address]  [place] [address] [phone]         [facility] Follow up.   Why: You may use this agency for support group services. Contact information: Address: [facility], [place], [place] [address]  Phone: [phone] Email: [email or web address]        [facility] Follow up.   Why: You may use this agency for support group services. Contact information: Address: [address] [place], [place], [place] [address]  Phone: [phone]                Next level of care provider has access to [place] and Suicide Prevention discussed: Yes,  SPE completed with patient and Keeyon Privitera [phone] (Mother).     Has patient been referred to the Quitline?: Patient refused referral Tobacco Use: High Risk (02/24/2022)   Patient History    Smoking Tobacco Use: Every Day    Smokeless Tobacco Use: Never    Passive Exposure: Not on file   Patient has been referred for addiction treatment: Pt. refused referral Social History   Substance and Sexual Activity  Drug Use Not Currently   Types: Cocaine, LSD, Marijuana, IV   Social History   Substance and Sexual Activity  Alcohol  Use Yes   Comment: "4 beers every so often"   Durenda Hurt, [place] 03/02/2022, 9:57 AM
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
## 2022-03-02 NOTE — Plan of Care (Signed)
  Problem: Education: Goal: Knowledge of [facility] Education information/materials will improve Outcome: Completed/Met Goal: Emotional status will improve Outcome: Completed/Met Goal: Mental status will improve Outcome: Completed/Met Goal: Verbalization of understanding the information provided will improve Outcome: Completed/Met   

## 2022-03-02 NOTE — Group Note (Signed)
[facility] LCSW Group Therapy Note   Group Date: 03/02/2022 Start Time: 1300 End Time: 1400   Type of Therapy and Topic: Group Therapy: Avoiding Self-Sabotaging and Enabling Behaviors  Participation Level: Did Not Attend  Description of Group:  In this group, patients will learn how to identify obstacles, self-sabotaging and enabling behaviors, as well as: what are they, why do we do them and what needs these behaviors meet. Discuss unhealthy relationships and how to have positive healthy boundaries with those that sabotage and enable. Explore aspects of self-sabotage and enabling in yourself and how to limit these self-destructive behaviors in everyday life.   Therapeutic Goals: 1. Patient will identify one obstacle that relates to self-sabotage and enabling behaviors 2. Patient will identify one personal self-sabotaging or enabling behavior they did prior to admission 3. Patient will state a plan to change the above identified behavior 4. Patient will demonstrate ability to communicate their needs through discussion and/or role play.    Summary of Patient Progress: Patient did not attend group despite encouraged participation.    Therapeutic Modalities:  Cognitive Behavioral Therapy Person-Centered Therapy Motivational Interviewing    Durenda Hurt, [place]

## 2022-05-19 ENCOUNTER — Other Ambulatory Visit: Payer: Self-pay

## 2022-05-19 ENCOUNTER — Emergency Department (EMERGENCY_DEPARTMENT_HOSPITAL)
Admission: EM | Admit: 2022-05-19 | Discharge: 2022-05-20 | Disposition: A | Discharge status: Other Acute Inpatient Hospital | Payer: Medicaid Other | Source: Home / Self Care | Attending: Emergency Medicine | Admitting: Emergency Medicine

## 2022-05-19 ENCOUNTER — Encounter (HOSPITAL_COMMUNITY): Payer: Self-pay

## 2022-05-19 DIAGNOSIS — Y900 Blood alcohol level of less than 20 mg/100 ml: Secondary | ICD-10-CM | POA: Insufficient documentation

## 2022-05-19 DIAGNOSIS — R45851 Suicidal ideations: Secondary | ICD-10-CM | POA: Insufficient documentation

## 2022-05-19 DIAGNOSIS — F1210 Cannabis abuse, uncomplicated: Secondary | ICD-10-CM | POA: Insufficient documentation

## 2022-05-19 DIAGNOSIS — F419 Anxiety disorder, unspecified: Secondary | ICD-10-CM | POA: Insufficient documentation

## 2022-05-19 DIAGNOSIS — F332 Major depressive disorder, recurrent severe without psychotic features: Secondary | ICD-10-CM

## 2022-05-19 DIAGNOSIS — R251 Tremor, unspecified: Secondary | ICD-10-CM | POA: Insufficient documentation

## 2022-05-19 DIAGNOSIS — F17210 Nicotine dependence, cigarettes, uncomplicated: Secondary | ICD-10-CM | POA: Insufficient documentation

## 2022-05-19 DIAGNOSIS — Z20822 Contact with and (suspected) exposure to covid-19: Secondary | ICD-10-CM | POA: Insufficient documentation

## 2022-05-19 LAB — CBC WITH DIFFERENTIAL/PLATELET
Abs Immature Granulocytes: 0.02 10*3/uL (ref 0.00–0.07)
Basophils Absolute: 0 10*3/uL (ref 0.0–0.1)
Basophils Relative: 1 %
Eosinophils Absolute: 0.1 10*3/uL (ref 0.0–0.5)
Eosinophils Relative: 1 %
HCT: 47.7 % (ref 39.0–52.0)
Hemoglobin: 16 g/dL (ref 13.0–17.0)
Immature Granulocytes: 0 %
Lymphocytes Relative: 21 %
Lymphs Abs: 1.6 10*3/uL (ref 0.7–4.0)
MCH: 30 pg (ref 26.0–34.0)
MCHC: 33.5 g/dL (ref 30.0–36.0)
MCV: 89.3 fL (ref 80.0–100.0)
Monocytes Absolute: 0.3 10*3/uL (ref 0.1–1.0)
Monocytes Relative: 4 %
Neutro Abs: 5.4 10*3/uL (ref 1.7–7.7)
Neutrophils Relative %: 73 %
Platelets: 313 10*3/uL (ref 150–400)
RBC: 5.34 MIL/uL (ref 4.22–5.81)
RDW: 11.7 % (ref 11.5–15.5)
WBC: 7.4 10*3/uL (ref 4.0–10.5)
nRBC: 0 % (ref 0.0–0.2)

## 2022-05-19 LAB — COMPREHENSIVE METABOLIC PANEL
ALT: 15 U/L (ref 0–44)
AST: 17 U/L (ref 15–41)
Albumin: 4.9 g/dL (ref 3.5–5.0)
Alkaline Phosphatase: 80 U/L (ref 38–126)
Anion gap: 9 (ref 5–15)
BUN: 9 mg/dL (ref 6–20)
CO2: 25 mmol/L (ref 22–32)
Calcium: 9.7 mg/dL (ref 8.9–10.3)
Chloride: 105 mmol/L (ref 98–111)
Creatinine, Ser: 0.72 mg/dL (ref 0.61–1.24)
GFR, Estimated: >60 mL/min (ref >60)
Glucose, Bld: 95 mg/dL (ref 70–99)
Potassium: 4.1 mmol/L (ref 3.5–5.1)
Sodium: 139 mmol/L (ref 135–145)
Total Bilirubin: 0.6 mg/dL (ref 0.3–1.2)
Total Protein: 8.2 g/dL — ABNORMAL HIGH (ref 6.5–8.1)

## 2022-05-19 LAB — SALICYLATE LEVEL: Salicylate Lvl: <7 mg/dL — ABNORMAL LOW (ref 7.0–30.0)

## 2022-05-19 LAB — RAPID URINE DRUG SCREEN, HOSP PERFORMED
Amphetamines: NOT DETECTED
Barbiturates: NOT DETECTED
Benzodiazepines: NOT DETECTED
Cocaine: NOT DETECTED
Opiates: NOT DETECTED
Tetrahydrocannabinol: POSITIVE — AB

## 2022-05-19 LAB — ETHANOL: Alcohol, Ethyl (B): <10 mg/dL (ref <10)

## 2022-05-19 LAB — ACETAMINOPHEN LEVEL: Acetaminophen (Tylenol), Serum: <10 ug/mL — ABNORMAL LOW (ref 10–30)

## 2022-05-19 MED ORDER — LORAZEPAM 1 MG PO TABS
1.0000 mg | ORAL_TABLET | Freq: Once | ORAL | Status: AC
  Administered 2022-05-19: 1 mg via ORAL
  Filled 2022-05-19: qty 1

## 2022-05-19 MED ORDER — BENZTROPINE MESYLATE 0.5 MG PO TABS
0.5000 mg | ORAL_TABLET | Freq: Two times a day (BID) | ORAL | Status: DC
  Administered 2022-05-19 – 2022-05-20 (×2): 0.5 mg via ORAL
  Filled 2022-05-19 (×2): qty 1

## 2022-05-19 MED ORDER — TRAZODONE HCL 100 MG PO TABS: 50.0000 mg | ORAL_TABLET | Freq: Every evening | ORAL | Status: DC | PRN

## 2022-05-19 MED ORDER — GABAPENTIN 100 MG PO CAPS
100.0000 mg | ORAL_CAPSULE | Freq: Three times a day (TID) | ORAL | Status: DC
  Administered 2022-05-19 – 2022-05-20 (×2): 100 mg via ORAL
  Filled 2022-05-19 (×2): qty 1

## 2022-05-19 MED ORDER — FLUOXETINE HCL 20 MG PO CAPS
20.0000 mg | ORAL_CAPSULE | Freq: Every day | ORAL | Status: DC
  Administered 2022-05-20: 20 mg via ORAL
  Filled 2022-05-19: qty 1

## 2022-05-19 NOTE — BH Assessment (Addendum)
Per Dahlia Byes, NP, patient recommended for inpatient psychiatric treatment. Per [facility] AC Rosey Bath, RN), no appropriate beds at [facility].   Patient referred to the following hospitals for psychiatric treatment.   Destination  Service Provider Request Status Selected Services Address Phone Fax Patient Preferred  [facility]  Pending - Request Sent N/A [address], [place] [address] [phone] --  [facility]  Pending - Request Sent N/A [address] [place] [address] [phone] --  [facility]  Pending - Request Sent N/A [address] [place], [place] [address] [phone] --  [facility]  Pending - Request Sent N/A [address], [place] [address] [phone] --  [facility]  Pending - Request Sent N/A [address] [place], [place] [address] [phone] --  [facility]  Pending - Request Sent N/A [phone] [place] [address] [phone] --  [facility]  Pending - Request Sent N/A [address] [place], [place] [address] [phone] --  [facility]  Pending - Request Sent N/A [address], [place] [address] [phone] --  [facility]  Pending - Request Sent N/A [address] [place] [address] [phone] --  [facility]  Pending - Request Sent N/A [address] [place] [address] [phone] --  [facility]  Pending - Request Sent N/A [address] [place] [address] [phone] --  [facility]  Pending - Request Sent N/A [address], [place] [address] [phone] --  [facility]  Pending - Request Sent N/A [address], [place] [address]  [phone] --  [facility]  Pending - Request Sent N/A [address] [place] [address] [phone] --  [facility]  Pending - Request Sent N/A [address] [place] [address] [phone] --  [facility]  Pending - Request Sent N/A [address], [place] [address] [phone] --  [facility]  Pending - Request Sent N/A [address], [place] [address] [phone] --  [facility]  Pending - Request Sent N/A [address], [place] [address] [phone] --  [facility]  Pending - Request Sent N/A [address] [place] [address] [phone] --  [facility]  Pending - Request Sent N/A [address] [place] [address] [phone] --  [facility]  Pending - Request Sent N/A [address] [place] [address] [phone] --  [facility]  Pending - Request Sent N/A [address], [place] [address] [phone] --
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
## 2022-05-19 NOTE — ED Notes (Signed)
Pt has one belonging bag placed in cabinet in triage

## 2022-05-19 NOTE — ED Provider Notes (Signed)
[facility]-EMERGENCY DEPT Provider Note   CSN: [id number] Arrival date & time: 05/19/22  1236     History  Chief Complaint  Patient presents with   Suicidal   Tremors    Billy Rose is a 21 y.o. male.  HPI   21 year old male with medical history significant for schizoaffective disorder bipolar type, polysubstance abuse, prior suicide attempts, cannabis use presenting to the emergency department with 2 primary complaints.  The patient states that he has been having thoughts of suicide and mentioned in with EMS that he had plan to electrocute himself in a bathtub.  He denies a plan to me.  He denies any HI or AVH.  He states that he has run out of his home Cogentin.  He endorses tremors in his right lower extremity.  He denies any weakness or numbness.  He denies any fevers or chills.  Home Medications Prior to Admission medications   Medication Sig Start Date End Date Taking? Authorizing Provider  benztropine (COGENTIN) 0.5 MG tablet Take 1 tablet (0.5 mg total) by mouth 2 (two) times daily. 03/02/22 04/01/22  Thalia Party, MD  FLUoxetine (PROZAC) 20 MG capsule Take 1 capsule (20 mg total) by mouth daily. 03/03/22   Thalia Party, MD  gabapentin (NEURONTIN) 100 MG capsule Take 1 capsule (100 mg total) by mouth 3 (three) times daily. 03/02/22 04/01/22  Thalia Party, MD  haloperidol (HALDOL) 10 MG tablet Take 1 tablet (10 mg total) by mouth 2 (two) times daily. 02/14/22 03/16/22  Park Pope, MD  traZODone (DESYREL) 50 MG tablet Take 1 tablet (50 mg total) by mouth at bedtime as needed for sleep. 03/02/22   Starleen Blue, NP      Allergies    Penicillins, Sulfa antibiotics, and Abilify [aripiprazole]    Review of Systems   Review of Systems  All other systems reviewed and are negative.   Physical Exam Updated Vital Signs BP 132/81 (BP Location: Right Arm)   Pulse 94   Temp 98.1 F (36.7 C) (Oral)   Resp 18   Ht 5\' 8"  (1.727 m)   Wt 57.2 kg   SpO2 100%    BMI 19.16 kg/m  Physical Exam Vitals and nursing note reviewed.  Constitutional:      General: He is not in acute distress.    Appearance: He is well-developed.  HENT:     Head: Normocephalic and atraumatic.  Eyes:     Conjunctiva/sclera: Conjunctivae normal.  Cardiovascular:     Rate and Rhythm: Normal rate and regular rhythm.     Heart sounds: No murmur heard. Pulmonary:     Effort: Pulmonary effort is normal. No respiratory distress.     Breath sounds: Normal breath sounds.  Abdominal:     Palpations: Abdomen is soft.     Tenderness: There is no abdominal tenderness.  Musculoskeletal:        General: No swelling.     Cervical back: Neck supple.  Skin:    General: Skin is warm and dry.     Capillary Refill: Capillary refill takes less than 2 seconds.  Neurological:     Mental Status: He is alert.     Comments: MENTAL STATUS EXAM:    Orientation: Alert and oriented to person, place and time.  Memory: Cooperative, follows commands well.  Language: Speech is clear and language is normal.   CRANIAL NERVES:    CN 2 (Optic): Visual fields intact to confrontation.  CN 3,4,6 (EOM): Pupils equal and  reactive to light. Full extraocular eye movement without nystagmus.  CN 5 (Trigeminal): Facial sensation is normal, no weakness of masticatory muscles.  CN 7 (Facial): No facial weakness or asymmetry.  CN 8 (Auditory): Auditory acuity grossly normal.  CN 9,10 (Glossophar): The uvula is midline, the palate elevates symmetrically.  CN 11 (spinal access): Normal sternocleidomastoid and trapezius strength.  CN 12 (Hypoglossal): The tongue is midline. No atrophy or fasciculations.[name] Kitchen   MOTOR:  Muscle Strength: 5/5RUE, 5/5LUE, 5/5RLE, 5/5LLE.   COORDINATION:   Intact finger-to-nose, shaking of RLE noted that ceases on distraction.   SENSATION:   Intact to light touch all four extremities.  GAIT: Gait normal without ataxia   Psychiatric:        Mood and Affect: Mood normal.         Thought Content: Thought content includes suicidal ideation.     ED Results / Procedures / Treatments   Labs (all labs ordered are listed, but only abnormal results are displayed) Labs Reviewed  RAPID URINE DRUG SCREEN, HOSP PERFORMED - Abnormal; Notable for the following components:      Result Value   Tetrahydrocannabinol POSITIVE (*)    All other components within normal limits  ACETAMINOPHEN LEVEL - Abnormal; Notable for the following components:   Acetaminophen (Tylenol), Serum <10 (*)    All other components within normal limits  SALICYLATE LEVEL - Abnormal; Notable for the following components:   Salicylate Lvl [id number] (*)    All other components within normal limits  COMPREHENSIVE METABOLIC PANEL - Abnormal; Notable for the following components:   Total Protein 8.2 (*)    All other components within normal limits  ETHANOL  CBC WITH DIFFERENTIAL/PLATELET    EKG None  Radiology No results found.  Procedures Procedures    Medications Ordered in ED Medications  benztropine (COGENTIN) tablet 0.5 mg (has no administration in time range)  traZODone (DESYREL) tablet 50 mg (has no administration in time range)  FLUoxetine (PROZAC) capsule 20 mg (has no administration in time range)  gabapentin (NEURONTIN) capsule 100 mg (has no administration in time range)  LORazepam (ATIVAN) tablet 1 mg (1 mg Oral Given 05/19/22 1636)    ED Course/ Medical Decision Making/ A&P                           Medical Decision Making Risk Prescription drug management.   21 year old male with medical history significant for schizoaffective disorder bipolar type, polysubstance abuse, prior suicide attempts, cannabis use presenting to the emergency department with 2 primary complaints.  The patient states that he has been having thoughts of suicide and mentioned in with EMS that he had plan to electrocute himself in a bathtub.  He denies a plan to me.  He denies any HI or AVH.  He states that  he has run out of his home Cogentin.  He endorses tremors in his right lower extremity.  He denies any weakness or numbness.  He denies any fevers or chills.  On arrival, the patient was vitally stable.  On exam, some right lower extremity shaking that ceases with patient distraction.  Patient has endorsed SI today, had previously endorsed plan but denies plan to me.  He presents voluntarily for evaluation.  He is requesting a refill of his Cogentin.  Plan for screening laboratory evaluation which included Tylenol, salicylate, ethanol levels, CMP, CBC which were all unremarkable.  His UDS was positive for THC.  He was administered a  single Ativan tablet for anxiousness and his leg shaking.  No clear neurologic deficits on exam.  He is afebrile, no rigidity, no clonus, is not hypertensive, not diaphoretic, low concern for NMS or serotonin syndrome.  Consults TTS was placed due to concern for patient suicidality.  Home medications were reordered.  Patient remains voluntary for evaluation.   Final Clinical Impression(s) / ED Diagnoses Final diagnoses:  Suicidal ideation  Occasional tremors    Rx / DC Orders ED Discharge Orders     None         Regan Lemming, MD 05/19/22 1929

## 2022-05-19 NOTE — ED Triage Notes (Signed)
Per EMS- Patient reports that he is having tremors and is not eligible to have a refill of his Cogentin until June 09, 2022.  Patient told EMS that he was suicidal and his plan is to electrocute himself in the bathtub.

## 2022-05-19 NOTE — Progress Notes (Addendum)
@  2015, Per Delaney Meigs, patient accepted to [facility] for admission 05/23/2022 any time after 8am. The accepting provider is Dr. Estill Cotta. Nurse report [phone]. Patient to present to the Main campus upon arrival.Patient's nurse Marchelle Folks, RN) and Jonny Ruiz, RN) provided disposition updates.

## 2022-05-20 ENCOUNTER — Encounter (HOSPITAL_COMMUNITY): Payer: Self-pay | Admitting: Nurse Practitioner

## 2022-05-20 ENCOUNTER — Other Ambulatory Visit: Payer: Self-pay

## 2022-05-20 ENCOUNTER — Inpatient Hospital Stay (HOSPITAL_COMMUNITY)
Admission: AD | Admit: 2022-05-20 | Discharge: 2022-05-24 | DRG: 885 | Disposition: A | Discharge status: Home / Self Care | Payer: Medicaid Other | Source: Other Acute Inpatient Hospital | Attending: Psychiatry | Admitting: Psychiatry

## 2022-05-20 DIAGNOSIS — F17210 Nicotine dependence, cigarettes, uncomplicated: Secondary | ICD-10-CM | POA: Diagnosis present

## 2022-05-20 DIAGNOSIS — F1590 Other stimulant use, unspecified, uncomplicated: Secondary | ICD-10-CM | POA: Diagnosis present

## 2022-05-20 DIAGNOSIS — F29 Unspecified psychosis not due to a substance or known physiological condition: Secondary | ICD-10-CM | POA: Diagnosis not present

## 2022-05-20 DIAGNOSIS — Z880 Allergy status to penicillin: Secondary | ICD-10-CM

## 2022-05-20 DIAGNOSIS — Z1152 Encounter for screening for COVID-19: Secondary | ICD-10-CM | POA: Diagnosis not present

## 2022-05-20 DIAGNOSIS — F1220 Cannabis dependence, uncomplicated: Secondary | ICD-10-CM | POA: Diagnosis present

## 2022-05-20 DIAGNOSIS — F411 Generalized anxiety disorder: Secondary | ICD-10-CM | POA: Diagnosis present

## 2022-05-20 DIAGNOSIS — Z79899 Other long term (current) drug therapy: Secondary | ICD-10-CM

## 2022-05-20 DIAGNOSIS — K30 Functional dyspepsia: Secondary | ICD-10-CM | POA: Diagnosis present

## 2022-05-20 DIAGNOSIS — Z9151 Personal history of suicidal behavior: Secondary | ICD-10-CM

## 2022-05-20 DIAGNOSIS — Z560 Unemployment, unspecified: Secondary | ICD-10-CM

## 2022-05-20 DIAGNOSIS — Z888 Allergy status to other drugs, medicaments and biological substances status: Secondary | ICD-10-CM

## 2022-05-20 DIAGNOSIS — Z882 Allergy status to sulfonamides status: Secondary | ICD-10-CM

## 2022-05-20 DIAGNOSIS — R45851 Suicidal ideations: Secondary | ICD-10-CM | POA: Diagnosis present

## 2022-05-20 DIAGNOSIS — F250 Schizoaffective disorder, bipolar type: Secondary | ICD-10-CM | POA: Diagnosis present

## 2022-05-20 DIAGNOSIS — F332 Major depressive disorder, recurrent severe without psychotic features: Secondary | ICD-10-CM | POA: Diagnosis present

## 2022-05-20 LAB — RESP PANEL BY RT-PCR (RSV, FLU A&B, COVID)  RVPGX2
Influenza A by PCR: NEGATIVE
Influenza B by PCR: NEGATIVE
Resp Syncytial Virus by PCR: NEGATIVE
SARS Coronavirus 2 by RT PCR: NEGATIVE

## 2022-05-20 MED ORDER — LORAZEPAM 1 MG PO TABS
1.0000 mg | ORAL_TABLET | Freq: Once | ORAL | Status: AC
  Administered 2022-05-20: 1 mg via ORAL
  Filled 2022-05-20: qty 1

## 2022-05-20 MED ORDER — TRAZODONE HCL 50 MG PO TABS
50.0000 mg | ORAL_TABLET | Freq: Every evening | ORAL | Status: DC | PRN
  Administered 2022-05-22: 50 mg via ORAL
  Filled 2022-05-20 (×2): qty 1
  Filled 2022-05-20: qty 10

## 2022-05-20 MED ORDER — BENZTROPINE MESYLATE 0.5 MG PO TABS
0.5000 mg | ORAL_TABLET | Freq: Two times a day (BID) | ORAL | Status: DC
  Administered 2022-05-20 – 2022-05-24 (×8): 0.5 mg via ORAL
  Filled 2022-05-20: qty 20
  Filled 2022-05-20 (×8): qty 1
  Filled 2022-05-20: qty 20
  Filled 2022-05-20: qty 1

## 2022-05-20 MED ORDER — GABAPENTIN 100 MG PO CAPS
100.0000 mg | ORAL_CAPSULE | Freq: Three times a day (TID) | ORAL | Status: DC
  Administered 2022-05-20 – 2022-05-24 (×11): 100 mg via ORAL
  Filled 2022-05-20 (×8): qty 1
  Filled 2022-05-20: qty 30
  Filled 2022-05-20 (×4): qty 1
  Filled 2022-05-20 (×2): qty 30
  Filled 2022-05-20 (×2): qty 1

## 2022-05-20 MED ORDER — ACETAMINOPHEN 325 MG PO TABS
650.0000 mg | ORAL_TABLET | Freq: Four times a day (QID) | ORAL | Status: DC | PRN
  Administered 2022-05-22 – 2022-05-23 (×2): 650 mg via ORAL
  Filled 2022-05-20 (×2): qty 2

## 2022-05-20 MED ORDER — MAGNESIUM HYDROXIDE 400 MG/5ML PO SUSP: 30.0000 mL | Freq: Every day | ORAL | Status: DC | PRN

## 2022-05-20 MED ORDER — ALUM & MAG HYDROXIDE-SIMETH 200-200-20 MG/5ML PO SUSP: 30.0000 mL | ORAL | Status: DC | PRN

## 2022-05-20 MED ORDER — NICOTINE 14 MG/24HR TD PT24
14.0000 mg | MEDICATED_PATCH | Freq: Every day | TRANSDERMAL | Status: DC
  Administered 2022-05-20: 14 mg via TRANSDERMAL
  Filled 2022-05-20 (×7): qty 1

## 2022-05-20 MED ORDER — FLUOXETINE HCL 20 MG PO CAPS
20.0000 mg | ORAL_CAPSULE | Freq: Every day | ORAL | Status: DC
  Administered 2022-05-21 – 2022-05-24 (×4): 20 mg via ORAL
  Filled 2022-05-20 (×4): qty 1
  Filled 2022-05-20: qty 10

## 2022-05-20 NOTE — ED Notes (Signed)
Pt made aware of admit to [facility], seems to be ok with it and cooperative.

## 2022-05-20 NOTE — ED Provider Notes (Addendum)
Emergency Medicine Observation Re-evaluation Note  Billy Rose is a 21 y.o. male, seen on rounds today.  Pt initially presented to the ED for complaints of Suicidal and Tremors Currently, the patient is resting in bed, mildly agitated wanting to leave.  Physical Exam  BP 112/76   Pulse 70   Temp 98.6 F (37 C) (Oral)   Resp 17   Ht 5\' 8"  (1.727 m)   Wt 57.2 kg   SpO2 98%   BMI 19.16 kg/m  Physical Exam General: NAD Cardiac: well perfused Lungs: CTAB Psych: mild agitation  ED Course / MDM  EKG:   I have reviewed the labs performed to date as well as medications administered while in observation.  Recent changes in the last 24 hours include Evaluated by psychiatry, recommended inpatient yesterday evening.   Pt currently without SI, HI, AVH. He requests to be discharged, stating "Im not a danger to myself or others." Psychiatry paged for a repeat assessment.   Oral ativan administered for mild agitation and restlessness.  Plan  Current plan is for inpatient psychiatric admission, social work coordinating. Engage TTS for reconsult.  Discussed with the patient and with , NP. Pt family collateral indicates admission given his hx of prior attempts. Pt remains voluntary, when informed of plan for admission, responds positively "ok, yeah, deal." COVID pending for bed and admission.    Dahlia Byes, MD 05/20/22 1026    05/22/22, MD 05/20/22 [phone]

## 2022-05-20 NOTE — Tx Team (Signed)
Initial Treatment Plan 05/20/2022 6:09 PM Yandriel Macaulay FFM:[id number]    PATIENT STRESSORS: Medication change or noncompliance   Substance abuse     PATIENT STRENGTHS: Ability for insight  Average or above average intelligence  Religious Affiliation    PATIENT IDENTIFIED PROBLEMS:   Suicidal Ideation    Anxiety    Substance Abuse           DISCHARGE CRITERIA:  Improved stabilization in mood, thinking, and/or behavior Motivation to continue treatment in a less acute level of care Safe-care adequate arrangements made  PRELIMINARY DISCHARGE PLAN: Attend aftercare/continuing care group Return to previous living arrangement  PATIENT/FAMILY INVOLVEMENT: This treatment plan has been presented to and reviewed with the patient, [name]. The patient has been given the opportunity to ask questions and make suggestions.  Earma Reading Jayanth Szczesniak, RN 05/20/2022, 6:09 PM

## 2022-05-20 NOTE — ED Notes (Signed)
[facility] called to transport to [facility].

## 2022-05-20 NOTE — Progress Notes (Signed)
Pt was accepted to [facility] TODAY 05/20/2022; pending negative covid and faxed voluntary consent paperwork to [phone]. Bed assignment: 404-2  Pt meets inpatient criteria per Dahlia Byes, NP  Attending Physician will be Phineas Inches, MD   Report can be called to:  -Adult unit: [phone]  Pt can arrive after 12 PM; pending items  Care Team Notified: [facility] Edythe Clarity, RN, Dahlia Byes, NP, and [address] LCSWA  [place], [place]  05/20/2022 10:44 AM
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
## 2022-05-20 NOTE — ED Notes (Signed)
Pt is asking about discharge, waiting on doctor to round, pt is having trouble staying still and getting comfortable.

## 2022-05-20 NOTE — Progress Notes (Signed)
Hitchcock did not attend wrap up group.

## 2022-05-21 ENCOUNTER — Encounter (HOSPITAL_COMMUNITY): Payer: Self-pay

## 2022-05-21 DIAGNOSIS — F29 Unspecified psychosis not due to a substance or known physiological condition: Secondary | ICD-10-CM | POA: Diagnosis not present

## 2022-05-21 MED ORDER — ENSURE ENLIVE PO LIQD
237.0000 mL | Freq: Two times a day (BID) | ORAL | Status: DC
  Administered 2022-05-21 – 2022-05-24 (×6): 237 mL via ORAL
  Filled 2022-05-21 (×8): qty 237

## 2022-05-21 MED ORDER — HALOPERIDOL 5 MG PO TABS
10.0000 mg | ORAL_TABLET | Freq: Two times a day (BID) | ORAL | Status: DC
  Administered 2022-05-21 – 2022-05-24 (×6): 10 mg via ORAL
  Filled 2022-05-21: qty 40
  Filled 2022-05-21 (×2): qty 2
  Filled 2022-05-21: qty 40
  Filled 2022-05-21 (×6): qty 2
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
## 2022-05-21 NOTE — Plan of Care (Signed)
?  Problem: Activity: ?Goal: Interest or engagement in activities will improve ?Outcome: Progressing ?Goal: Sleeping patterns will improve ?Outcome: Progressing ?  ?Problem: Coping: ?Goal: Ability to verbalize frustrations and anger appropriately will improve ?Outcome: Progressing ?Goal: Ability to demonstrate self-control will improve ?Outcome: Progressing ?  ?Problem: Safety: ?Goal: Periods of time without injury will increase ?Outcome: Progressing ?  ?

## 2022-05-21 NOTE — BHH Suicide Risk Assessment (Signed)
BHH INPATIENT:  Family/Significant Other Suicide Prevention Education  Suicide Prevention Education:  Education Completed; Lumir Demetriou (mom) [phone],  has been identified by the patient as the family member/significant other with whom the patient will be residing, and identified as the person(s) who will aid the patient in the event of a mental health crisis (suicidal ideations/suicide attempt).  With written consent from the patient, the family member/significant other has been provided the following suicide prevention education, prior to the and/or following the discharge of the patient.  CSW spoke with patient mom today to complete his safety planning. Mom states that she thought patient was doing so well since his last admission here. Patient was taking his medications and going to all his follow up appointments at [facility]. Mom states that that one day patient started back smoking weed and vaping with one of his coworkers at [employer], he stopped taking his medications and wasn't going to any of his appointments. For two days mom states that patient was leaving work early for being "too high" so the restaurant told patient that they were going to add him back on the schedule. Per mom, " I have told him along with other doctors that he should not be smoking weed because it will affect him and still continue to do so. I believe he may of had a panic attack that day as well with everything going on". Furthermore, mom speaks on how patient had ran out of his cogentin medication that he was getting filled at [facility] instead of [facility] and appointment was for January 8th, so it was a wait.   Mom does not know what else to do. She has possession of all his medications and take his AM and PM to his house so he can take in front of her and then she would stay with him for a hour and leave. Mom confirms that their are no guns, weapons, nor prescriptions in his home that can cause harm to patient. Also, mom states  that she goes to patient house to clean it, gave him a car that he can use, has a job awaiting for him at [employer], and she has paid his rent up to February. Per mom, " he cannot come stay with me because he accused my husband of bizarre things that he admitted was not true; all he want to do is smoke , not shower, or keep a job. He is a "smoking mirror".  Patient will DC back to his apartment and mom will continue to assist with his medications.   The suicide prevention education provided includes the following: Suicide risk factors Suicide prevention and interventions National Suicide Hotline telephone number [facility] assessment telephone number [facility] Emergency Assistance 911 [facility] and/or Residential Mobile Crisis Unit telephone number  Request made of family/significant other to: Remove weapons (e.g., guns, rifles, knives), all items previously/currently identified as safety concern.   Remove drugs/medications (over-the-counter, prescriptions, illicit drugs), all items previously/currently identified as a safety concern.  The family member/significant other verbalizes understanding of the suicide prevention education information provided.  The family member/significant other agrees to remove the items of safety concern listed above.  Isabella Bowens 05/21/2022, 2:17 PM

## 2022-05-21 NOTE — BHH Group Notes (Signed)
Patient did not attend goals group.  

## 2022-05-21 NOTE — Group Note (Signed)
Recreation Therapy Group Note   Group Topic:Stress Management  Group Date: 05/21/2022 Start Time: 0935 End Time: 0952 Facilitators: Karlye Ihrig-McCall, LRT,CTRS Location: [address] Dayroom   Goal Area(s) Addresses:  Patient will actively participate in stress management techniques presented during session.  Patient will successfully identify benefit of practicing stress management post d/c.   Group Description: Guided Imagery. LRT provided education, instruction, and demonstration on practice of visualization via guided imagery. Patient was asked to participate in the technique introduced during session. LRT debriefed including topics of mindfulness, stress management and specific scenarios each patient could use these techniques. Patients were given suggestions of ways to access scripts post d/c and encouraged to explore Youtube and other apps available on smartphones, tablets, and computers.   Affect/Mood: N/A   Participation Level: Did not attend    Clinical Observations/Individualized Feedback:      Plan: Continue to engage patient in RT group sessions 2-3x/week.   Billy Rose, LRT,CTRS 05/21/2022 12:28 PM

## 2022-05-21 NOTE — Progress Notes (Signed)
   05/21/22 2300  Psych Admission Type (Psych Patients Only)  Admission Status Voluntary  Psychosocial Assessment  Patient Complaints Anxiety;Depression  Eye Contact Brief  Facial Expression Flat  Affect Anxious;Sad  Speech Logical/coherent  Interaction Assertive  Motor Activity Slow  Appearance/Hygiene Unremarkable  Behavior Characteristics Cooperative;Appropriate to situation;Anxious  Mood Pleasant  Thought Process  Coherency WDL  Content WDL  Delusions None reported or observed  Perception WDL  Hallucination None reported or observed  Judgment Impaired  Confusion None  Danger to Self  Current suicidal ideation? Denies  Danger to Others  Danger to Others None reported or observed

## 2022-05-21 NOTE — BHH Suicide Risk Assessment (Signed)
Suicide Risk Assessment  Admission Assessment    [facility] Admission Suicide Risk Assessment   Nursing information obtained from:     Demographic factors:  Male, Living alone, Caucasian  Current Mental Status:  NA  Loss Factors:  NA  Historical Factors:  Family history of suicide, Family history of mental illness or substance abuse Risk Reduction Factors:  Religious beliefs about death, Positive social support  Total Time spent with patient: 1 hour  Principal Problem: Schizophrenia spectrum disorder with psychotic disorder type not yet determined (HCC)  Diagnosis:  Principal Problem:   Schizophrenia spectrum disorder with psychotic disorder type not yet determined (HCC) Active Problems:   Major depressive disorder, recurrent severe without psychotic features (HCC)  Subjective Data: See H&P.  Continued Clinical Symptoms:  Alcohol Use Disorder Identification Test Final Score (AUDIT): 0 The "Alcohol Use Disorders Identification Test", Guidelines for Use in Primary Care, Second Edition.  World Science writer [facility]). Score between 0-7:  no or low risk or alcohol related problems. Score between 8-15:  moderate risk of alcohol related problems. Score between 16-19:  high risk of alcohol related problems. Score 20 or above:  warrants further diagnostic evaluation for alcohol dependence and treatment.  CLINICAL FACTORS:   Depression:   Impulsivity Alcohol/Substance Abuse/Dependencies Schizophrenia:   Paranoid or undifferentiated type More than one psychiatric diagnosis Previous Psychiatric Diagnoses and Treatments   Musculoskeletal: Strength & Muscle Tone: within normal limits Gait & Station: normal Patient leans: N/A  Psychiatric Specialty Exam:  Presentation  General Appearance:  Disheveled  Eye Contact: Good  Speech: Clear and Coherent; Normal Rate  Speech Volume: Normal  Handedness: Right   Mood and Affect  Mood: -- (Currenbtly denies any symptoms of  depression.)  Affect: Appropriate; Congruent   Thought Process  Thought Processes: Coherent  Descriptions of Associations:Intact  Orientation:Full (Time, Place and Person)  Thought Content:Logical  History of Schizophrenia/Schizoaffective disorder:No data recorded Duration of Psychotic Symptoms:No data recorded Hallucinations:Hallucinations: None  Ideas of Reference:None  Suicidal Thoughts:Suicidal Thoughts: No SI Active Intent and/or Plan: Without Intent; Without Means to Carry Out; Without Plan; Without Access to Means  Homicidal Thoughts:Homicidal Thoughts: No   Sensorium  Memory: Immediate Good; Recent Good; Remote Good  Judgment: Fair  Insight: Lacking   Executive Functions  Concentration: Fair  Attention Span: Fair  Recall: Good  Fund of Knowledge: Fair  Language: Good   Psychomotor Activity  Psychomotor Activity: Psychomotor Activity: Normal   Assets  Assets: Communication Skills; Desire for Improvement; Housing; Health and safety inspector; Physical Health; Resilience; Social Support   Sleep  Sleep: Sleep: Good Number of Hours of Sleep: 7    Physical Exam: See H&P  Blood pressure 118/79, pulse 72, temperature 97.7 F (36.5 C), temperature source Oral, resp. rate 16, height 5\' 8"  (1.727 m), weight 57.2 kg, SpO2 100 %. Body mass index is 19.16 kg/m.  COGNITIVE FEATURES THAT CONTRIBUTE TO RISK:  Closed-mindedness, Loss of executive function, Polarized thinking, and Thought constriction (tunnel vision)    SUICIDE RISK:   Severe:  Frequent, intense, and enduring suicidal ideation, specific plan, no subjective intent, but some objective markers of intent (i.e., choice of lethal method), the method is accessible, some limited preparatory behavior, evidence of impaired self-control, severe dysphoria/symptomatology, multiple risk factors present, and few if any protective factors, particularly a lack of social support.  PLAN OF  CARE: See H&P.  I certify that inpatient services furnished can reasonably be expected to improve the patient's condition.   , NP, PMHNP, FNP-BC. 05/21/2022,  4:08 PM

## 2022-05-21 NOTE — BHH Group Notes (Signed)
Patient did not attend the NA group. 

## 2022-05-21 NOTE — H&P (Signed)
Psychiatric Admission Assessment Adult  Patient Identification: Pharaoh Pio  MRN:  [id number]  Date of Evaluation:  05/21/2022  Chief Complaint: Suicidal ideations with plans to electrocute himself in a bathtub.  Principal Diagnosis: Schizophrenia spectrum disorder with psychotic disorder type not yet determined (HCC)  Diagnosis:  Principal Problem:   Schizophrenia spectrum disorder with psychotic disorder type not yet determined (HCC) Active Problems:   Major depressive disorder, recurrent severe without psychotic features (HCC)  History of Present Illness: This is one several admission assessments in this [facility]/other health systems  for this 21 year old male with hx of mental illness & substance use disorder. He is admitted to the [facility] from the [facility] ED with complain of suicidal ideations with plans to electrocute himself in a bathtub. Chart review indicated that patient reported upon arrival to the ED that he had leg tremors since running out of his cogentin. After medical evaluation & stabilization, he was recommended for further psychiatric evaluation & treatments. He was transferred to the [facility]. A review of his lab results showed his UDS was positive for THC. During this evaluation, Kemani reports,   "The ambulance took me to the [place] [facility] 2 days ago. My leg would not stop moving for a whole week. I do not know why that was happening, so I called the ambulance that took me to the hospital. They fixed my problem at the other hospital before I came here.  I was told that the tremors was as a result of me running out of my cogentin. My leg stopped shaking because they re-started my cogentin.  My mood is fine. I take medicine for depression, schizophrenia & for the shakes. I was diagnosed with those disorders a long time ago. I cannot put a time on when I was first diagnosed. I know I'm better now. I am not feeling suicidal. I told them at the other hospital that I was  suicidal because they keep asking me if I am. I told them that I was suicidal & I was going to electrocute myself. It is not like I will do or going to do it. I know everyone has thought about what it would be like to kill yourself. That does not mean you will kill yourself. I know suicidal thoughts has crossed my mind, so are other people. I go to [place] for my mental heath care. I'm ready to be discharged. I'm ready to get out of here. I'm no feeling SIHI, no voices or seeing things. I'm just good".  Collateral information: Attempted to obtain collateral information, unsuccessful. Called patient's mother, Dorsey Authement [phone]. She is unavailable. Her answering service is full.  Associated Signs/Symptoms:  Depression Symptoms:   Currently denies any symptoms of depression or anxiety.  Duration of Depression Symptoms: Greater than two weeks  (Hypo) Manic Symptoms:   Denies any hypomanic symptoms at this time.  Anxiety Symptoms:   Excessive worry about getting discharged.  Psychotic Symptoms:   Denies any hallucinations, delusional thoughts or paranoia.  PTSD Symptoms: Negative  Total Time spent with patient: 1 hour  Past Psychiatric History: MDD, recurrent, severe, Substance induced psychosis, cannabis use disorder   Is the patient at risk to self? No.  Has the patient been a risk to self in the past 6 months? Yes.    Has the patient been a risk to self within the distant past? Yes.    Is the patient a risk to others? No.  Has the patient been  a risk to others in the past 6 months? No.  Has the patient been a risk to others within the distant past? No.   Prior Inpatient Therapy: Yes Prior Outpatient Therapy: Yes  Alcohol Screening: 1. How often do you have a drink containing alcohol?: Never 2. How many drinks containing alcohol do you have on a typical day when you are drinking?: 1 or 2 3. How often do you have six or more drinks on one occasion?: Never AUDIT-C Score: 0 4.  How often during the last year have you found that you were not able to stop drinking once you had started?: Never 5. How often during the last year have you failed to do what was normally expected from you because of drinking?: Never 6. How often during the last year have you needed a first drink in the morning to get yourself going after a heavy drinking session?: Never 7. How often during the last year have you had a feeling of guilt of remorse after drinking?: Never 8. How often during the last year have you been unable to remember what happened the night before because you had been drinking?: Never 9. Have you or someone else been injured as a result of your drinking?: No 10. Has a relative or friend or a doctor or another health worker been concerned about your drinking or suggested you cut down?: No Alcohol Use Disorder Identification Test Final Score (AUDIT): 0  Substance Abuse History in the last 12 months:  Yes.    Consequences of Substance Abuse: Discussed with patient during this admission evaluation. Medical Consequences:  Liver damage, Possible death by overdose Legal Consequences:  Arrests, jail time, Loss of driving privilege. Family Consequences:  Family discord, divorce and or separation.  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 No pertinent surgical history. Family History:  Family History  Problem Relation Age of Onset   Allergic rhinitis Neg Hx    Asthma Neg Hx    Eczema Neg Hx    Urticaria Neg Hx    Family Psychiatric  History: Unknown  Tobacco Screening:    Social History: Single, has no children, unemployed, smokes cigarettes. Social History   Substance and Sexual  Activity  Alcohol Use Yes   Comment: "4 beers every so often"     Social History   Substance and Sexual Activity  Drug Use Not Currently   Types: Cocaine, LSD, Marijuana, IV    Additional Social History: Marital status: Single Are you sexually active?: No What is your sexual orientation?: Heterosexual Has your sexual activity been affected by drugs, alcohol, medication, or emotional stress?: No   Allergies:   Allergies  Allergen Reactions   Penicillins Anaphylaxis, Swelling and Other (See Comments)    Did it involve swelling of the face/tongue/throat, SOB, or low BP? Yes Did it involve sudden or severe rash/hives, skin peeling, or any reaction on the inside of your mouth or nose? Unk Did you need to seek medical attention at a hospital or doctor's office? Unk When did it last happen? Childhood If all above answers are "NO", may proceed with cephalosporin use.   Sulfa Antibiotics Anaphylaxis, Shortness Of Breath, Swelling, Rash and Other (See Comments)  Throat and face swell   Abilify [Aripiprazole] Other (See Comments)    Worsened the patient   Lab Results:  Results for orders placed or performed during the hospital encounter of 05/19/22 (from the past 48 hour(s))  Ethanol     Status: None   Collection Time: 05/19/22  4:04 PM  Result Value Ref Range   Alcohol, Ethyl (B) <10 <10 mg/dL    Comment: (NOTE) Lowest detectable limit for serum alcohol is 10 mg/dL.  For medical purposes only. Performed at [facility], [address], [place], [place] [address]   CBC with Diff     Status: None   Collection Time: 05/19/22  4:04 PM  Result Value Ref Range   WBC 7.4 4.0 - 10.5 K/uL   RBC 5.34 4.22 - 5.81 MIL/uL   Hemoglobin 16.0 13.0 - 17.0 g/dL   HCT 62.1 30.8 - 65.7 %   MCV 89.3 80.0 - 100.0 fL   MCH 30.0 26.0 - 34.0 pg   MCHC 33.5 30.0 - 36.0 g/dL   RDW 84.6 96.2 - 95.2 %   Platelets 313 150 - 400 K/uL   nRBC 0.0 0.0 - 0.2 %   Neutrophils Relative %  73 %   Neutro Abs 5.4 1.7 - 7.7 K/uL   Lymphocytes Relative 21 %   Lymphs Abs 1.6 0.7 - 4.0 K/uL   Monocytes Relative 4 %   Monocytes Absolute 0.3 0.1 - 1.0 K/uL   Eosinophils Relative 1 %   Eosinophils Absolute 0.1 0.0 - 0.5 K/uL   Basophils Relative 1 %   Basophils Absolute 0.0 0.0 - 0.1 K/uL   Immature Granulocytes 0 %   Abs Immature Granulocytes 0.02 0.00 - 0.07 K/uL    Comment: Performed at [facility], [address], [place], [place] [address]  Acetaminophen level     Status: Abnormal   Collection Time: 05/19/22  4:04 PM  Result Value Ref Range   Acetaminophen (Tylenol), Serum <10 (L) 10 - 30 ug/mL    Comment: (NOTE) Therapeutic concentrations vary significantly. A range of 10-30 ug/mL  may be an effective concentration for many patients. However, some  are best treated at concentrations outside of this range. Acetaminophen concentrations >150 ug/mL at 4 hours after ingestion  and >50 ug/mL at 12 hours after ingestion are often associated with  toxic reactions.  Performed at [facility], [address] [place], [place] [address]   Salicylate level     Status: Abnormal   Collection Time: 05/19/22  4:04 PM  Result Value Ref Range   Salicylate Lvl <7.0 (L) 7.0 - 30.0 mg/dL    Comment: Performed at [facility], [address] [place], [place] [address]  Comprehensive metabolic panel     Status: Abnormal   Collection Time: 05/19/22  4:14 PM  Result Value Ref Range   Sodium 139 135 - 145 mmol/L   Potassium 4.1 3.5 - 5.1 mmol/L   Chloride 105 98 - 111 mmol/L   CO2 25 22 - 32 mmol/L   Glucose, Bld 95 70 - 99 mg/dL    Comment: Glucose reference range applies only to samples taken after fasting for at least 8 hours.   BUN 9 6 - 20 mg/dL   Creatinine, Ser 6.64 0.61 - 1.24 mg/dL   Calcium 9.7 8.9 - 40.3 mg/dL   Total Protein 8.2 (H) 6.5 - 8.1 g/dL   Albumin 4.9 3.5 - 5.0 g/dL   AST  17 15 - 41 U/L   ALT 15 0 - 44  U/L   Alkaline Phosphatase 80 38 - 126 U/L   Total Bilirubin 0.6 0.3 - 1.2 mg/dL   GFR, Estimated >40>60 >98>60 mL/min    Comment: (NOTE) Calculated using the CKD-EPI Creatinine Equation (2021)    Anion gap 9 5 - 15    Comment: Performed at [facility], [address] [place], [place] [address]  Resp panel by RT-PCR (RSV, Flu A&B, Covid) Anterior Nasal Swab     Status: None   Collection Time: 05/20/22 10:12 AM   Specimen: Anterior Nasal Swab  Result Value Ref Range   SARS Coronavirus 2 by RT PCR NEGATIVE NEGATIVE    Comment: (NOTE) SARS-CoV-2 target nucleic acids are NOT DETECTED.  The SARS-CoV-2 RNA is generally detectable in upper respiratory specimens during the acute phase of infection. The lowest concentration of SARS-CoV-2 viral copies this assay can detect is 138 copies/mL. A negative result does not preclude SARS-Cov-2 infection and should not be used as the sole basis for treatment or other patient management decisions. A negative result may occur with  improper specimen collection/handling, submission of specimen other than nasopharyngeal swab, presence of viral mutation(s) within the areas targeted by this assay, and inadequate number of viral copies(<138 copies/mL). A negative result must be combined with clinical observations, patient history, and epidemiological information. The expected result is Negative.  Fact Sheet for Patients:  BloggerCourse.comhttps://www.fda.gov/media/152166/download  Fact Sheet for Healthcare Providers:  SeriousBroker.ithttps://www.fda.gov/media/152162/download  This test is no t yet approved or cleared by the [place] FDA and  has been authorized for detection and/or diagnosis of SARS-CoV-2 by FDA under an Emergency Use Authorization (EUA). This EUA will remain  in effect (meaning this test can be used) for the duration of the COVID-19 declaration under Section 564(b)(1) of the Act, 21 U.S.C.section 360bbb-3(b)(1), unless the authorization is  terminated  or revoked sooner.       Influenza A by PCR NEGATIVE NEGATIVE   Influenza B by PCR NEGATIVE NEGATIVE    Comment: (NOTE) The Xpert Xpress SARS-CoV-2/FLU/RSV plus assay is intended as an aid in the diagnosis of influenza from Nasopharyngeal swab specimens and should not be used as a sole basis for treatment. Nasal washings and aspirates are unacceptable for Xpert Xpress SARS-CoV-2/FLU/RSV testing.  Fact Sheet for Patients: BloggerCourse.comhttps://www.fda.gov/media/152166/download  Fact Sheet for Healthcare Providers: SeriousBroker.ithttps://www.fda.gov/media/152162/download  This test is not yet approved or cleared by the [place] FDA and has been authorized for detection and/or diagnosis of SARS-CoV-2 by FDA under an Emergency Use Authorization (EUA). This EUA will remain in effect (meaning this test can be used) for the duration of the COVID-19 declaration under Section 564(b)(1) of the Act, 21 U.S.C. section 360bbb-3(b)(1), unless the authorization is terminated or revoked.     Resp Syncytial Virus by PCR NEGATIVE NEGATIVE    Comment: (NOTE) Fact Sheet for Patients: BloggerCourse.comhttps://www.fda.gov/media/152166/download  Fact Sheet for Healthcare Providers: SeriousBroker.ithttps://www.fda.gov/media/152162/download  This test is not yet approved or cleared by the [place] FDA and has been authorized for detection and/or diagnosis of SARS-CoV-2 by FDA under an Emergency Use Authorization (EUA). This EUA will remain in effect (meaning this test can be used) for the duration of the COVID-19 declaration under Section 564(b)(1) of the Act, 21 U.S.C. section 360bbb-3(b)(1), unless the authorization is terminated or revoked.  Performed at [facility], [address] [place], [place] [address]    Blood Alcohol level:  Lab Results  Component Value Date   ETH <10 05/19/2022   ETH <10 02/26/2022   Metabolic Disorder Labs:  Lab Results  Component Value Date   HGBA1C 5.1 02/07/2022   MPG  99.67 02/07/2022   MPG 102.54 10/10/2020   Lab Results  Component Value Date   PROLACTIN 24.1 (H) 05/22/2020   PROLACTIN 32.1 (H) 02/15/2017   Lab Results  Component Value Date   CHOL 128 02/07/2022   TRIG 81 02/07/2022   HDL 51 02/07/2022   CHOLHDL 2.5 02/07/2022   VLDL 16 02/07/2022   LDLCALC 61 02/07/2022   LDLCALC 52 10/10/2020   Current Medications: Current Facility-Administered Medications  Medication Dose Route Frequency Provider Last Rate Last Admin   acetaminophen (TYLENOL) tablet 650 mg  650 mg Oral Q6H PRN Dahlia Byes C, NP       alum & mag hydroxide-simeth (MAALOX/MYLANTA) 200-200-20 MG/5ML suspension 30 mL  30 mL Oral Q4H PRN Dahlia Byes C, NP       benztropine (COGENTIN) tablet 0.5 mg  0.5 mg Oral BID Dahlia Byes C, NP   0.5 mg at 05/21/22 0848   feeding supplement (ENSURE ENLIVE / ENSURE PLUS) liquid 237 mL  237 mL Oral BID BM Attiah, Nadir, MD   237 mL at 05/21/22 1405   FLUoxetine (PROZAC) capsule 20 mg  20 mg Oral Daily Dahlia Byes C, NP   20 mg at 05/21/22 0848   gabapentin (NEURONTIN) capsule 100 mg  100 mg Oral TID Dahlia Byes C, NP   100 mg at 05/21/22 1256   haloperidol (HALDOL) tablet 10 mg  10 mg Oral BID Prestyn Stanco, Nicole Kindred I, NP       magnesium hydroxide (MILK OF MAGNESIA) suspension 30 mL  30 mL Oral Daily PRN Dahlia Byes C, NP       nicotine (NICODERM CQ - dosed in mg/24 hours) patch 14 mg  14 mg Transdermal Daily Massengill, Harrold Donath, MD   14 mg at 05/20/22 1721   traZODone (DESYREL) tablet 50 mg  50 mg Oral QHS PRN Earney Navy, NP       PTA Medications: Medications Prior to Admission  Medication Sig Dispense Refill Last Dose   benztropine (COGENTIN) 0.5 MG tablet Take 1 tablet (0.5 mg total) by mouth 2 (two) times daily. 60 tablet 0    FLUoxetine (PROZAC) 20 MG capsule Take 1 capsule (20 mg total) by mouth daily. 30 capsule 0    gabapentin (NEURONTIN) 100 MG capsule Take 1 capsule (100 mg total) by mouth 3 (three)  times daily. 90 capsule 0    haloperidol (HALDOL) 10 MG tablet Take 1 tablet (10 mg total) by mouth 2 (two) times daily. 60 tablet 0    Multiple Vitamin (MULTIVITAMIN) capsule Take 1 capsule by mouth daily.      traZODone (DESYREL) 50 MG tablet Take 1 tablet (50 mg total) by mouth at bedtime as needed for sleep. 30 tablet 0     Musculoskeletal: Strength & Muscle Tone: within normal limits Gait & Station: normal Patient leans: N/A  Psychiatric Specialty Exam:  Presentation  General Appearance: Disheveled  Eye Contact:Good  Speech:Clear and Coherent; Normal Rate  Speech Volume:Normal  Handedness:Right  Mood and Affect  Mood:-- (Currenbtly denies any symptoms of depression.)  Affect:Appropriate; Congruent  Thought Process  Thought Processes:Coherent  Duration of Psychotic Symptoms: No data recorded  Past Diagnosis of Schizophrenia or Psychoactive disorder: No data recorded  Descriptions of Associations:Intact  Orientation:Full (Time, Place and Person)  Thought Content:Logical  Hallucinations:Hallucinations:  None  Ideas of Reference:None  Suicidal Thoughts:Suicidal Thoughts: No SI Active Intent and/or Plan: Without Intent; Without Means to Carry Out; Without Plan; Without Access to Means  Homicidal Thoughts:Homicidal Thoughts: No   Sensorium  Memory:Immediate Good; Recent Good; Remote Good  Judgment:Fair  Insight:Lacking  Executive Functions  Concentration:Fair  Attention Span:Fair  Recall:Good  Fund of Knowledge:Fair  Language:Good  Psychomotor Activity  Psychomotor Activity:Psychomotor Activity: Normal   Assets  Assets:Communication Skills; Desire for Improvement; Housing; Health and safety inspector; Physical Health; Resilience; Social Support  Sleep  Sleep:Sleep: Good Number of Hours of Sleep: 7   Physical Exam: Physical Exam Vitals and nursing note reviewed.  Constitutional:      Appearance: Normal appearance.  HENT:     Head:  Normocephalic.     Nose: Nose normal.     Mouth/Throat:     Pharynx: Oropharynx is clear.  Cardiovascular:     Rate and Rhythm: Normal rate.     Pulses: Normal pulses.  Pulmonary:     Effort: Pulmonary effort is normal.  Genitourinary:    Comments: Deferred Musculoskeletal:        General: Normal range of motion.     Cervical back: Normal range of motion.  Skin:    General: Skin is warm and dry.  Neurological:     General: No focal deficit present.     Mental Status: He is alert and oriented to person, place, and time.  Psychiatric:        Attention and Perception: He is attentive. He does not perceive auditory or visual hallucinations.        Mood and Affect: Mood normal.        Speech: Speech normal.        Behavior: Behavior normal.        Thought Content: Thought content is not paranoid or delusional. Thought content does not include homicidal or suicidal ideation. Thought content does not include homicidal or suicidal plan.        Cognition and Memory: Cognition normal.    Review of Systems  Constitutional:  Negative for chills, diaphoresis and fever.  HENT:  Negative for congestion and sore throat.   Respiratory:  Negative for cough, shortness of breath and wheezing.   Cardiovascular:  Negative for chest pain and palpitations.  Gastrointestinal:  Negative for heartburn.  Genitourinary: Negative.   Musculoskeletal: Negative.  Negative for joint pain and myalgias.  Skin:  Negative for itching and rash.  Neurological: Negative.  Negative for dizziness, tingling, tremors, sensory change, speech change, focal weakness, seizures, loss of consciousness, weakness and headaches.  Endo/Heme/Allergies:        Allergies: Abilify, Sulfa drugs, PCN.  Psychiatric/Behavioral:  Positive for depression, hallucinations and substance abuse. Negative for memory loss and suicidal ideas. The patient is nervous/anxious and has insomnia.    Blood pressure 118/79, pulse 72, temperature 97.7 F  (36.5 C), temperature source Oral, resp. rate 16, height  (1.727 m), weight 57.2 kg, SpO2 100 %. Body mass index is 19.16 kg/m.  Treatment Plan Summary: Daily contact with patient to assess and evaluate symptoms and progress in treatment and Medication management.   Diagnoses: 1. Schizophrenia Spectrum disorder with psychotic features type not yet determined. 2. Major depressive disorder.  Plan: Continue Haldol 10 mg po Q bedtime for mood control.  Continue Cogentin 0.5 mg po bid for eps.  Fluoxetine 20 mg po daily for depression.  Gabapentin 100 mg po tid for agitation.  Continue Trazodone 50 mg po Q  hs prn for insomnia. Continue nicotine patch 14 mg trans-dermally Q 24 hrs for nicotine withdrawal.   Other PRNS -Continue Tylenol 650 mg every 6 hours PRN for mild pain -Continue Maalox 30 ml Q 4 hrs PRN for indigestion -Continue MOM 30 ml po Q 6 hrs for constipation  Safety and Monitoring: Voluntary admission to inpatient psychiatric unit for safety, stabilization and treatment Daily contact with patient to assess and evaluate symptoms and progress in treatment Patient's case to be discussed in multi-disciplinary team meeting Observation Level : q15 minute checks Vital signs: q12 hours Precautions: Safety  Discharge Planning: Social work and case management to assist with discharge planning and identification of hospital follow-up needs prior to discharge Estimated LOS: 5-7 days Discharge Concerns: Need to establish a safety plan; Medication compliance and effectiveness Discharge Goals: Return home with outpatient referrals for mental health follow-up including medication management/psychotherapy  Observation Level/Precautions:  15 minute checks  Laboratory: Per ED, current lab results reviewed:    Psychotherapy: Enrolled in the roup therapy  Medications:  See MAR  Consultations:  TBD  Discharge Concerns:  Safety, mood stability, maintaining sobriety.  Estimated LOS:  3-5 days  Other: NA     Physician Treatment Plan for Primary Diagnosis: Schizophrenia spectrum disorder with psychotic disorder type not yet determined (HCC)  Long Term Goal(s): Improvement in symptoms so as ready for discharge  Short Term Goals: Ability to identify changes in lifestyle to reduce recurrence of condition will improve, Ability to verbalize feelings will improve, Ability to disclose and discuss suicidal ideas, and Ability to demonstrate self-control will improve  I certify that inpatient services furnished can reasonably be expected to improve the patient's condition.    Armandina Stammer, NP, pmhnp, fnp-bc. 12/20/20233:43 PM

## 2022-05-21 NOTE — BH IP Treatment Plan (Signed)
Interdisciplinary Treatment and Diagnostic Plan Update  05/21/2022 Time of Session: 9:35am  Billy Rose MRN: [id number]  Principal Diagnosis: Major depressive disorder, recurrent severe without psychotic features ([place])  Secondary Diagnoses: Principal Problem:   Major depressive disorder, recurrent severe without psychotic features ([place])   Current Medications:  Current Facility-Administered Medications  Medication Dose Route Frequency Provider Last Rate Last Admin   acetaminophen (TYLENOL) tablet 650 mg  650 mg Oral Q6H PRN Delfin Gant, NP       alum & mag hydroxide-simeth (MAALOX/MYLANTA) 200-200-20 MG/5ML suspension 30 mL  30 mL Oral Q4H PRN Charmaine Downs C, NP       benztropine (COGENTIN) tablet 0.5 mg  0.5 mg Oral BID Charmaine Downs C, NP   0.5 mg at 05/21/22 0848   FLUoxetine (PROZAC) capsule 20 mg  20 mg Oral Daily Charmaine Downs C, NP   20 mg at 05/21/22 0848   gabapentin (NEURONTIN) capsule 100 mg  100 mg Oral TID Charmaine Downs C, NP   100 mg at 05/21/22 1256   magnesium hydroxide (MILK OF MAGNESIA) suspension 30 mL  30 mL Oral Daily PRN Charmaine Downs C, NP       nicotine (NICODERM CQ - dosed in mg/24 hours) patch 14 mg  14 mg Transdermal Daily Massengill, Ovid Curd, MD   14 mg at 05/20/22 1721   traZODone (DESYREL) tablet 50 mg  50 mg Oral QHS PRN Delfin Gant, NP       PTA Medications: Medications Prior to Admission  Medication Sig Dispense Refill Last Dose   benztropine (COGENTIN) 0.5 MG tablet Take 1 tablet (0.5 mg total) by mouth 2 (two) times daily. 60 tablet 0    FLUoxetine (PROZAC) 20 MG capsule Take 1 capsule (20 mg total) by mouth daily. 30 capsule 0    gabapentin (NEURONTIN) 100 MG capsule Take 1 capsule (100 mg total) by mouth 3 (three) times daily. 90 capsule 0    haloperidol (HALDOL) 10 MG tablet Take 1 tablet (10 mg total) by mouth 2 (two) times daily. 60 tablet 0    Multiple Vitamin (MULTIVITAMIN) capsule Take 1 capsule by mouth  daily.      traZODone (DESYREL) 50 MG tablet Take 1 tablet (50 mg total) by mouth at bedtime as needed for sleep. 30 tablet 0     Patient Stressors: Medication change or noncompliance   Substance abuse    Patient Strengths: Ability for insight  Average or above average intelligence  Religious Affiliation   Treatment Modalities: Medication Management, Group therapy, Case management,  1 to 1 session with clinician, Psychoeducation, Recreational therapy.   Physician Treatment Plan for Primary Diagnosis: Major depressive disorder, recurrent severe without psychotic features ([place]) Long Term Goal(s): Improvement in symptoms so as ready for discharge   Short Term Goals: Ability to identify changes in lifestyle to reduce recurrence of condition will improve Ability to verbalize feelings will improve Ability to disclose and discuss suicidal ideas Ability to demonstrate self-control will improve  Medication Management: Evaluate patient's response, side effects, and tolerance of medication regimen.  Therapeutic Interventions: 1 to 1 sessions, Unit Group sessions and Medication administration.  Evaluation of Outcomes: Not Met  Physician Treatment Plan for Secondary Diagnosis: Principal Problem:   Major depressive disorder, recurrent severe without psychotic features ([place])  Long Term Goal(s): Improvement in symptoms so as ready for discharge   Short Term Goals: Ability to identify changes in lifestyle to reduce recurrence of condition will improve Ability to verbalize feelings will improve  Ability to disclose and discuss suicidal ideas Ability to demonstrate self-control will improve     Medication Management: Evaluate patient's response, side effects, and tolerance of medication regimen.  Therapeutic Interventions: 1 to 1 sessions, Unit Group sessions and Medication administration.  Evaluation of Outcomes: Not Met   RN Treatment Plan for Primary Diagnosis: Major depressive disorder,  recurrent severe without psychotic features ([place]) Long Term Goal(s): Knowledge of disease and therapeutic regimen to maintain health will improve  Short Term Goals: Ability to remain free from injury will improve, Ability to participate in decision making will improve, Ability to verbalize feelings will improve, Ability to disclose and discuss suicidal ideas, and Ability to identify and develop effective coping behaviors will improve  Medication Management: RN will administer medications as ordered by provider, will assess and evaluate patient's response and provide education to patient for prescribed medication. RN will report any adverse and/or side effects to prescribing provider.  Therapeutic Interventions: 1 on 1 counseling sessions, Psychoeducation, Medication administration, Evaluate responses to treatment, Monitor vital signs and CBGs as ordered, Perform/monitor CIWA, COWS, AIMS and Fall Risk screenings as ordered, Perform wound care treatments as ordered.  Evaluation of Outcomes: Not Met   LCSW Treatment Plan for Primary Diagnosis: Major depressive disorder, recurrent severe without psychotic features ([place]) Long Term Goal(s): Safe transition to appropriate next level of care at discharge, Engage patient in therapeutic group addressing interpersonal concerns.  Short Term Goals: Engage patient in aftercare planning with referrals and resources, Increase social support, Increase emotional regulation, Facilitate acceptance of mental health diagnosis and concerns, Identify triggers associated with mental health/substance abuse issues, and Increase skills for wellness and recovery  Therapeutic Interventions: Assess for all discharge needs, 1 to 1 time with Social worker, Explore available resources and support systems, Assess for adequacy in community support network, Educate family and significant other(s) on suicide prevention, Complete Psychosocial Assessment, Interpersonal group  therapy.  Evaluation of Outcomes: Not Met   Progress in Treatment: Attending groups: No. Participating in groups: No. Taking medication as prescribed: Yes. Toleration medication: Yes. Family/Significant other contact made: Yes, individual(s) contacted:  Mother  Patient understands diagnosis: No. Discussing patient identified problems/goals with staff: Yes. Medical problems stabilized or resolved: Yes. Denies suicidal/homicidal ideation: Yes. Issues/concerns per patient self-inventory: No.   New problem(s) identified: No, Describe:  None   New Short Term/Long Term Goal(s): medication stabilization, elimination of SI thoughts, development of comprehensive mental wellness plan.   Patient Goals: "To go home"   Discharge Plan or Barriers: Patient recently admitted. CSW will continue to follow and assess for appropriate referrals and possible discharge planning.   Reason for Continuation of Hospitalization: Anxiety Depression Medication stabilization Suicidal ideation  Estimated Length of Stay: 3 to 7 days   Last [place] Suicide Severity Risk Score: [place] Admission (Current) from 05/20/2022 in [facility] ED from 05/19/2022 in [facility] DEPT Admission (Discharged) from OP Visit from 02/24/2022 in [facility]  C-SSRS RISK CATEGORY No Risk Moderate Risk No Risk       Last PHQ 2/9 Scores:     No data to display          Scribe for Treatment Team: Darleen Crocker, Latanya Presser 05/21/2022 1:26 PM

## 2022-05-21 NOTE — Progress Notes (Signed)
   05/20/22 2000  Psych Admission Type (Psych Patients Only)  Admission Status Voluntary  Psychosocial Assessment  Patient Complaints Anxiety;Depression  Eye Contact Brief  Facial Expression Flat  Affect Anxious;Sad  Speech Logical/coherent  Interaction Assertive  Motor Activity Slow  Appearance/Hygiene Unremarkable  Behavior Characteristics Cooperative;Appropriate to situation  Mood Pleasant  Thought Process  Coherency WDL  Content WDL  Delusions None reported or observed  Perception WDL  Hallucination None reported or observed  Judgment Impaired  Confusion None  Danger to Self  Current suicidal ideation? Denies (Denies)  Danger to Others  Danger to Others None reported or observed   Billy Rose is minimal and isolative to his room this shift. Support and encouragement provided.

## 2022-05-21 NOTE — Progress Notes (Signed)
   05/21/22 0800  Psych Admission Type (Psych Patients Only)  Admission Status Voluntary  Psychosocial Assessment  Patient Complaints Anxiety;Depression  Eye Contact Brief  Facial Expression Flat  Affect Anxious;Sad  Speech Logical/coherent  Interaction Assertive  Motor Activity Slow  Appearance/Hygiene Unremarkable  Behavior Characteristics Cooperative;Appropriate to situation  Mood Pleasant  Thought Process  Coherency WDL  Content WDL  Delusions None reported or observed  Perception WDL  Hallucination None reported or observed  Judgment Impaired  Confusion None  Danger to Self  Current suicidal ideation? Denies  Danger to Others  Danger to Others None reported or observed

## 2022-05-22 DIAGNOSIS — F29 Unspecified psychosis not due to a substance or known physiological condition: Secondary | ICD-10-CM | POA: Diagnosis not present

## 2022-05-22 NOTE — Group Note (Signed)
LCSW Group Therapy Note   Group Date: 05/22/2022 Start Time: 1100 End Time: 1200  Type of Therapy and Topic:  Group Therapy:  Setting Goals   Participation Level:  Did not attend    Description of Group: In this process group, patients discussed using strengths to work toward goals and address challenges.  Patients identified two positive things about themselves and one goal they were working on.  Patients were given the opportunity to share openly and support each other's plan for self-empowerment.  The group discussed the value of gratitude and were encouraged to have a daily reflection of positive characteristics or circumstances.  Patients were encouraged to identify a plan to utilize their strengths to work on current challenges and goals.   Therapeutic Goals Patient will verbalize personal strengths/positive qualities and relate how these can assist with achieving desired personal goals Patients will verbalize affirmation of peers plans for personal change and goal setting Patients will explore the value of gratitude and positive focus as related to successful achievement of goals Patients will verbalize a plan for regular reinforcement of personal positive qualities and circumstances.   Summary of Patient Progress: Did not attend      Therapeutic Modalities Cognitive Behavioral Therapy Motivational Interviewing  [name], Theresia Majors 05/22/2022  1:39 PM

## 2022-05-22 NOTE — Progress Notes (Signed)
Billy Rose requested to meet with chaplain.  He shared about some of his experiences of being in the hospital and about how he feels that being in his room makes him feel worse.  He is trying to go to groups and talk with people.  He did not have any specific concerns he wanted to discuss, but was appreciative of the visit.  Chaplain Dyanne Carrel, Bcc PAger, [phone]

## 2022-05-22 NOTE — Progress Notes (Addendum)
D: Pt denied SI/HI/AVH this morning. Pt denied feelings of anxiety or depression at this time. Pt complains of a sore throat this morning, no other symptoms at this time. Pt reports that he slept well last night. Pt has been pleasant, calm, and cooperative throughout the shift.   A: RN provided support and encouragement to patient. Pt given scheduled medications as prescribed. RN encouraged pt to drink fluids to aid with sore throat. Q15 min checks verified for safety.    R: Patient verbally contracts for safety. Patient compliant with medications and treatment plan. Patient is interacting well on the unit. Pt is safe on the unit.   05/22/22 1025  Psych Admission Type (Psych Patients Only)  Admission Status Voluntary  Psychosocial Assessment  Patient Complaints None  Eye Contact Fair  Facial Expression Flat  Affect Anxious;Sad  Speech Logical/coherent  Interaction Assertive  Motor Activity Slow  Appearance/Hygiene Unremarkable  Behavior Characteristics Cooperative;Appropriate to situation  Mood Pleasant  Thought Process  Coherency WDL  Content WDL  Delusions None reported or observed  Perception WDL  Hallucination None reported or observed  Judgment Impaired  Confusion None  Danger to Self  Current suicidal ideation? Denies  Danger to Others  Danger to Others None reported or observed

## 2022-05-22 NOTE — Progress Notes (Signed)
    05/22/22 2117  Psych Admission Type (Psych Patients Only)  Admission Status Voluntary  Psychosocial Assessment  Patient Complaints None  Eye Contact Fair  Facial Expression Flat  Affect Anxious  Speech Logical/coherent  Interaction Assertive  Motor Activity Slow  Appearance/Hygiene Unremarkable  Behavior Characteristics Cooperative;Appropriate to situation  Mood Pleasant;Anxious  Thought Process  Coherency WDL  Content WDL  Delusions None reported or observed  Perception WDL  Hallucination None reported or observed  Judgment Impaired  Confusion None  Danger to Self  Current suicidal ideation? Denies  Danger to Others  Danger to Others None reported or observed

## 2022-05-22 NOTE — Progress Notes (Signed)
[facility] MD Progress Note  05/22/2022 4:57 PM Billy Rose  MRN:  [id number]  Reason for admission: 21 year old male with hx of mental illness & substance use disorder. He is admitted to the [facility] from the [facility] ED with complain of suicidal ideations with plans to electrocute himself in a bathtub. Chart review indicated that patient reported upon arrival to the ED that he had leg tremors since running out of his cogentin. After medical evaluation & stabilization, he was recommended for further psychiatric evaluation & treatments.   Daily notes: Billy Rose is seen, chart reviewed. The chart findings discussed with the treatment team. He presents alert, oriented & aware of situation. He is visible on the unit, attending group sessions. He says he is "doing well, but a little depressed here & there, nothing major". However, his affect does not reflect his expression of his mood. He affect is good/reactive. He says "the sun is out this morning, I never like sunlight". When asked why so, Billy Rose replied, "I never have". He went further to say, "I would like to get discharged on the 23rd of this month so I can go to Christmas dinner with my family".  Patient's behavior & mannerism seem a bit odd. However, there are no behavioral issues reported by staff. He is attending group sessions. He is taking & tolerating his treatment regimen. Denies any Side effects. He denies any abnormal tremors. He says he slept well last night. He currently denies any SIHI, AVH, delusional thoughts or paranoia. He does not appear to be responding to any internal stimuli. There are no changes made on the current plan of care. Will continue current plan of care as already in progress.   Principal Problem: Schizophrenia spectrum disorder with psychotic disorder type not yet determined (HCC)  Diagnosis: Principal Problem:   Schizophrenia spectrum disorder with psychotic disorder type not yet determined ([facility]) Active Problems:    Major depressive disorder, recurrent severe without psychotic features ([facility])  Total Time spent with patient:  35 minutes  Past Psychiatric History: See H&P.  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 No pertinent surgical history.  Family History:  Family History  Problem Relation Age of Onset   Allergic rhinitis Neg Hx    Asthma Neg Hx    Eczema Neg Hx    Urticaria Neg Hx    Family Psychiatric  History: See H&P.  Social History:  Social History   Substance and Sexual Activity  Alcohol Use Yes   Comment: "4 beers every so often"     Social History   Substance and Sexual Activity  Drug Use Not Currently   Types: Cocaine, LSD, Marijuana, IV    Social History   Socioeconomic History   Marital status: Single    Spouse name: Not on file   Number of children: Not on file   Years of education: Not on file   Highest education level: Not on file  Occupational History   Not on file  Tobacco Use   Smoking status: Every Day    Packs/day: 1.00    Types: Cigarettes   Smokeless tobacco: Never  Vaping Use   Vaping Use: Never used  Substance and Sexual Activity   Alcohol use: Yes    Comment: "  4 beers every so often"   Drug use: Not Currently    Types: Cocaine, LSD, Marijuana, IV   Sexual activity: Not Currently  Other Topics Concern   Not on file  Social History Narrative   ** Merged History Encounter **       Social Determinants of Health   Financial Resource Strain: Not on file  Food Insecurity: No Food Insecurity (05/20/2022)   Hunger Vital Sign    Worried About Running Out of Food in the Last Year: Never true    Ran Out of Food in the Last Year: Never true  Transportation Needs: No Transportation Needs (05/20/2022)   PRAPARE - Therapist, artTransportation     Lack of Transportation (Medical): No    Lack of Transportation (Non-Medical): No  Physical Activity: Not on file  Stress: Not on file  Social Connections: Not on file   Additional Social History:   Sleep: Good  Appetite:  Good  Current Medications: Current Facility-Administered Medications  Medication Dose Route Frequency Provider Last Rate Last Admin   acetaminophen (TYLENOL) tablet 650 mg  650 mg Oral Q6H PRN Dahlia Byesnuoha, Josephine C, NP   650 mg at 05/22/22 0644   alum & mag hydroxide-simeth (MAALOX/MYLANTA) 200-200-20 MG/5ML suspension 30 mL  30 mL Oral Q4H PRN Dahlia Byesnuoha, Josephine C, NP       benztropine (COGENTIN) tablet 0.5 mg  0.5 mg Oral BID Onuoha, Josephine C, NP   0.5 mg at 05/22/22 0747   feeding supplement (ENSURE ENLIVE / ENSURE PLUS) liquid 237 mL  237 mL Oral BID BM Attiah, Nadir, MD   237 mL at 05/22/22 1239   FLUoxetine (PROZAC) capsule 20 mg  20 mg Oral Daily Onuoha, Josephine C, NP   20 mg at 05/22/22 0747   gabapentin (NEURONTIN) capsule 100 mg  100 mg Oral TID Dahlia Byesnuoha, Josephine C, NP   100 mg at 05/22/22 1155   haloperidol (HALDOL) tablet 10 mg  10 mg Oral BID Armandina StammerNwoko, Veverly Larimer I, NP   10 mg at 05/22/22 0747   magnesium hydroxide (MILK OF MAGNESIA) suspension 30 mL  30 mL Oral Daily PRN Dahlia Byesnuoha, Josephine C, NP       nicotine (NICODERM CQ - dosed in mg/24 hours) patch 14 mg  14 mg Transdermal Daily Massengill, Nathan, MD   14 mg at 05/20/22 1721   traZODone (DESYREL) tablet 50 mg  50 mg Oral QHS PRN Earney Navynuoha, Josephine C, NP       Lab Results:  No results found for this or any previous visit (from the past 48 hour(s)).  Blood Alcohol level:  Lab Results  Component Value Date   ETH <10 05/19/2022   ETH <10 02/26/2022   Metabolic Disorder Labs: Lab Results  Component Value Date   HGBA1C 5.1 02/07/2022   MPG 99.67 02/07/2022   MPG 102.54 10/10/2020   Lab Results  Component Value Date   PROLACTIN 24.1 (H) 05/22/2020   PROLACTIN 32.1 (H) 02/15/2017   Lab Results   Component Value Date   CHOL 128 02/07/2022   TRIG 81 02/07/2022   HDL 51 02/07/2022   CHOLHDL 2.5 02/07/2022   VLDL 16 02/07/2022   LDLCALC 61 02/07/2022   LDLCALC 52 10/10/2020   Physical Findings: AIMS:  , ,  ,  ,    CIWA:    COWS:     Musculoskeletal: Strength & Muscle Tone: within normal limits Gait & Station: normal Patient leans: N/A  Psychiatric Specialty Exam:  Presentation  General Appearance:  Disheveled  Eye Contact: Good  Speech: Clear and Coherent; Normal Rate  Speech Volume: Normal  Handedness: Right  Mood and Affect  Mood: -- (Currenbtly denies any symptoms of depression.)  Affect: Appropriate; Congruent   Thought Process  Thought Processes: Coherent  Descriptions of Associations:Intact  Orientation:Full (Time, Place and Person)  Thought Content:Logical  History of Schizophrenia/Schizoaffective disorder:No data recorded Duration of Psychotic Symptoms:No data recorded Hallucinations:Hallucinations: None  Ideas of Reference:None  Suicidal Thoughts:Suicidal Thoughts: No SI Active Intent and/or Plan: Without Intent; Without Means to Carry Out; Without Plan; Without Access to Means  Homicidal Thoughts:Homicidal Thoughts: No   Sensorium  Memory: Immediate Good; Recent Good; Remote Good  Judgment: Fair  Insight: Lacking  Executive Functions  Concentration: Fair  Attention Span: Fair  Recall: Good  Fund of Knowledge: Fair  Language: Good  Psychomotor Activity  Psychomotor Activity: Psychomotor Activity: Normal  Assets  Assets: Communication Skills; Desire for Improvement; Housing; Health and safety inspector; Physical Health; Resilience; Social Support  Sleep  Sleep: Sleep: Good Number of Hours of Sleep: 7  Physical Exam: Physical Exam Vitals and nursing note reviewed.  HENT:     Nose: Nose normal.     Mouth/Throat:     Pharynx: Oropharynx is clear.  Eyes:     Pupils: Pupils are equal, round,  and reactive to light.  Cardiovascular:     Rate and Rhythm: Normal rate.  Pulmonary:     Effort: Pulmonary effort is normal.  Genitourinary:    Comments: Deferred Musculoskeletal:        General: Normal range of motion.     Cervical back: Normal range of motion.  Skin:    General: Skin is warm and dry.  Neurological:     General: No focal deficit present.     Mental Status: He is alert and oriented to person, place, and time.    Review of Systems  Constitutional:  Negative for chills, diaphoresis and fever.  HENT:  Negative for congestion and sore throat.   Cardiovascular:  Negative for chest pain and palpitations.  Gastrointestinal:  Negative for abdominal pain, constipation, diarrhea, heartburn, nausea and vomiting.  Genitourinary:  Negative for dysuria.  Musculoskeletal:  Negative for joint pain and myalgias.  Skin:  Negative for itching and rash.  Neurological:  Negative for dizziness, tingling, tremors, sensory change, speech change, focal weakness, seizures, loss of consciousness, weakness and headaches.  Endo/Heme/Allergies:        See allergy lists.  Psychiatric/Behavioral:  Positive for depression and substance abuse. Negative for hallucinations (Hx of), memory loss and suicidal ideas (Hx of (stabilizing)). The patient is not nervous/anxious and does not have insomnia.    Blood pressure 117/74, pulse (!) 117, temperature 97.7 F (36.5 C), temperature source Oral, resp. rate 16, height 5\' 8"  (1.727 m), weight 57.2 kg, SpO2 96 %. Body mass index is 19.16 kg/m.  Treatment Plan Summary: Daily contact with patient to assess and evaluate symptoms and progress in treatment and Medication management.   Continue inpatient hospitalization.  Will continue today 05/22/2022 plan as below except where it is noted.   Diagnoses: 1. Schizophrenia Spectrum disorder with psychotic features type not yet determined. 2. Major depressive disorder.   Plan: Continue Haldol 10 mg po Q  bedtime for mood control.  Continue Cogentin 0.5 mg po bid for eps.  Continue Fluoxetine 20 mg po daily for depression.  Gabapentin 100 mg po tid for agitation.  Continue Trazodone 50 mg po Q hs prn for insomnia. Continue nicotine patch  14 mg trans-dermally Q 24 hrs for nicotine withdrawal.    Other PRNS -Continue Tylenol 650 mg every 6 hours PRN for mild pain -Continue Maalox 30 ml Q 4 hrs PRN for indigestion -Continue MOM 30 ml po Q 6 hrs for constipation   Safety and Monitoring: Voluntary admission to inpatient psychiatric unit for safety, stabilization and treatment Daily contact with patient to assess and evaluate symptoms and progress in treatment Patient's case to be discussed in multi-disciplinary team meeting Observation Level : q15 minute checks Vital signs: q12 hours Precautions: Safety   Discharge Planning: Social work and case management to assist with discharge planning and identification of hospital follow-up needs prior to discharge Estimated LOS: 5-7 days Discharge Concerns: Need to establish a safety plan; Medication compliance and effectiveness Discharge Goals: Return home with outpatient referrals for mental health follow-up including medication management/psychotherapy  Armandina Stammer, NP, pmhnp, fnp-bc 05/22/2022, 4:57 PM

## 2022-05-22 NOTE — Progress Notes (Signed)
Psychoeducational Group Note  Date:  05/22/2022 Time:  2107  Group Topic/Focus:  Wrap-Up Group:   The focus of this group is to help patients review their daily goal of treatment and discuss progress on daily workbooks.  Participation Level: Did Not Attend  Participation Quality:  Not Applicable  Affect:  Not Applicable  Cognitive:  Not Applicable  Insight:  Not Applicable  Engagement in Group: Not Applicable  Additional Comments:  The patient did not attend group this evening.   Hazle Coca S 05/22/2022, 9:07 PM

## 2022-05-22 NOTE — Plan of Care (Signed)
  Problem: Education: Goal: Mental status will improve Outcome: Progressing   Problem: Coping: Goal: Ability to demonstrate self-control will improve Outcome: Progressing   Problem: Education: Goal: Emotional status will improve Outcome: Not Progressing

## 2022-05-23 DIAGNOSIS — F29 Unspecified psychosis not due to a substance or known physiological condition: Secondary | ICD-10-CM | POA: Diagnosis not present

## 2022-05-23 NOTE — Plan of Care (Signed)
  Problem: Education: Goal: Knowledge of [facility] Education information/materials will improve Outcome: Progressing Goal: Emotional status will improve Outcome: Progressing Goal: Mental status will improve Outcome: Progressing Goal: Verbalization of understanding the information provided will improve Outcome: Progressing   Problem: Activity: Goal: Interest or engagement in activities will improve Outcome: Progressing Goal: Sleeping patterns will improve Outcome: Progressing   Problem: Coping: Goal: Ability to verbalize frustrations and anger appropriately will improve Outcome: Progressing Goal: Ability to demonstrate self-control will improve Outcome: Progressing   

## 2022-05-23 NOTE — Progress Notes (Signed)
   05/23/22 0800  Psych Admission Type (Psych Patients Only)  Admission Status Voluntary  Psychosocial Assessment  Patient Complaints None  Eye Contact Fair  Facial Expression Flat  Affect Anxious  Speech Logical/coherent  Interaction Assertive  Motor Activity Slow  Appearance/Hygiene Unremarkable  Behavior Characteristics Cooperative;Appropriate to situation  Mood Pleasant;Anxious  Thought Process  Coherency WDL  Content WDL  Delusions None reported or observed  Perception WDL  Hallucination None reported or observed  Judgment Impaired  Confusion None  Danger to Self  Current suicidal ideation? Denies  Danger to Others  Danger to Others None reported or observed

## 2022-05-23 NOTE — Progress Notes (Signed)
[facility] MD Progress Note  05/23/2022 3:00 PM Billy Rose  MRN:  [id number]  Reason for admission: 21 year old male with hx of mental illness & substance use disorder. He is admitted to the [facility] from the [facility] ED with complain of suicidal ideations with plans to electrocute himself in a bathtub. Chart review indicated that patient reported upon arrival to the ED that he had leg tremors since running out of his cogentin. After medical evaluation & stabilization, he was recommended for further psychiatric evaluation & treatments.   Daily notes: Billy Rose is seen, chart reviewed. The chart findings discussed with the treatment team. He presents alert, oriented & aware of situation. He is visible on the unit, attending group sessions. He says he is doing well. He says he is invested in attending group sessions, taking his medications & doing all he is asked to do. He says he feels he is ready to go home by tomorrow. Billy Rose currently denies any SIHI, AVH, delusional thoughts or paranoia. He does not appear to be responding to any internal stimuli. Patient is scheduled to be discharged in am.  Principal Problem: Schizophrenia spectrum disorder with psychotic disorder type not yet determined ([place])  Diagnosis: Principal Problem:   Schizophrenia spectrum disorder with psychotic disorder type not yet determined ([place]) Active Problems:   Major depressive disorder, recurrent severe without psychotic features ([place])  Total Time spent with patient:  25 minutes  Past Psychiatric History: See H&P.  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 No pertinent surgical history.  Family History:  Family History  Problem Relation Age of Onset   Allergic rhinitis Neg Hx    Asthma Neg Hx     Eczema Neg Hx    Urticaria Neg Hx    Family Psychiatric  History: See H&P.  Social History:  Social History   Substance and Sexual Activity  Alcohol Use Yes   Comment: "4 beers every so often"     Social History   Substance and Sexual Activity  Drug Use Not Currently   Types: Cocaine, LSD, Marijuana, IV    Social History   Socioeconomic History   Marital status: Single    Spouse name: Not on file   Number of children: Not on file   Years of education: Not on file   Highest education level: Not on file  Occupational History   Not on file  Tobacco Use   Smoking status: Every Day    Packs/day: 1.00    Types: Cigarettes   Smokeless tobacco: Never  Vaping Use   Vaping Use: Never used  Substance and Sexual Activity   Alcohol use: Yes    Comment: "4 beers every so often"   Drug use: Not Currently    Types: Cocaine, LSD, Marijuana, IV   Sexual activity: Not Currently  Other Topics Concern   Not on file  Social History Narrative   ** Merged History Encounter **       Social Determinants of Health   Financial Resource Strain: Not on file  Food Insecurity: No Food Insecurity (05/20/2022)   Hunger Vital Sign    Worried About Running Out of Food in the Last Year: Never true    Ran Out of Food in the Last Year:  Never true  Transportation Needs: No Transportation Needs (05/20/2022)   PRAPARE - Hydrologist (Medical): No    Lack of Transportation (Non-Medical): No  Physical Activity: Not on file  Stress: Not on file  Social Connections: Not on file   Additional Social History:   Sleep: Good  Appetite:  Good  Current Medications: Current Facility-Administered Medications  Medication Dose Route Frequency Provider Last Rate Last Admin   acetaminophen (TYLENOL) tablet 650 mg  650 mg Oral Q6H PRN Charmaine Downs C, NP   650 mg at 05/22/22 0644   alum & mag hydroxide-simeth (MAALOX/MYLANTA) 200-200-20 MG/5ML suspension 30 mL  30 mL Oral  Q4H PRN Charmaine Downs C, NP       benztropine (COGENTIN) tablet 0.5 mg  0.5 mg Oral BID Onuoha, Josephine C, NP   0.5 mg at 05/23/22 [id number]   feeding supplement (ENSURE ENLIVE / ENSURE PLUS) liquid 237 mL  237 mL Oral BID BM Attiah, Nadir, MD   237 mL at 05/23/22 1316   FLUoxetine (PROZAC) capsule 20 mg  20 mg Oral Daily Onuoha, Josephine C, NP   20 mg at 05/23/22 [id number]   gabapentin (NEURONTIN) capsule 100 mg  100 mg Oral TID Charmaine Downs C, NP   100 mg at 05/23/22 1316   haloperidol (HALDOL) tablet 10 mg  10 mg Oral BID Lindell Spar I, NP   10 mg at 05/23/22 [id number]   magnesium hydroxide (MILK OF MAGNESIA) suspension 30 mL  30 mL Oral Daily PRN Charmaine Downs C, NP       nicotine (NICODERM CQ - dosed in mg/24 hours) patch 14 mg  14 mg Transdermal Daily Massengill, Nathan, MD   14 mg at 05/20/22 1721   traZODone (DESYREL) tablet 50 mg  50 mg Oral QHS PRN Charmaine Downs C, NP   50 mg at 05/22/22 2117   Lab Results:  No results found for this or any previous visit (from the past 107 hour(s)).  Blood Alcohol level:  Lab Results  Component Value Date   ETH <10 05/19/2022   ETH <10 123456   Metabolic Disorder Labs: Lab Results  Component Value Date   HGBA1C 5.1 02/07/2022   MPG 99.67 02/07/2022   MPG 102.54 10/10/2020   Lab Results  Component Value Date   PROLACTIN 24.1 (H) 05/22/2020   PROLACTIN 32.1 (H) 02/15/2017   Lab Results  Component Value Date   CHOL 128 02/07/2022   TRIG 81 02/07/2022   HDL 51 02/07/2022   CHOLHDL 2.5 02/07/2022   VLDL 16 02/07/2022   LDLCALC 61 02/07/2022   LDLCALC 52 10/10/2020   Physical Findings: AIMS:  , ,  ,  ,    CIWA:    COWS:     Musculoskeletal: Strength & Muscle Tone: within normal limits Gait & Station: normal Patient leans: N/A  Psychiatric Specialty Exam:  Presentation  General Appearance:  Appropriate for Environment; Casual; Fairly Groomed  Eye Contact: Good  Speech: Clear and Coherent; Normal Rate  Speech  Volume: Normal  Handedness: Right  Mood and Affect  Mood: -- ("Improved".)  Affect: Appropriate; Congruent   Thought Process  Thought Processes: Coherent; Goal Directed  Descriptions of Associations:Intact  Orientation:Full (Time, Place and Person)  Thought Content:Logical  History of Schizophrenia/Schizoaffective disorder:No data recorded Duration of Psychotic Symptoms:No data recorded Hallucinations:No data recorded  Ideas of Reference:None  Suicidal Thoughts:Suicidal Thoughts: No SI Active Intent and/or Plan: Without Intent; Without Plan; Without Means to [employer];  Without Access to Means   Homicidal Thoughts:Homicidal Thoughts: No    Sensorium  Memory: Immediate Good; Recent Good; Remote Good  Judgment: Good  Insight: Good  Executive Functions  Concentration: Good  Attention Span: Good  Recall: Good  Fund of Knowledge: Good  Language: Good  Psychomotor Activity  Psychomotor Activity: Psychomotor Activity: Normal   Assets  Assets: Communication Skills; Desire for Improvement; Housing; Catering manager; Physical Health; Resilience; Social Support  Sleep  Sleep: Sleep: Good Number of Hours of Sleep: 9.5   Physical Exam: Physical Exam Vitals and nursing note reviewed.  HENT:     Nose: Nose normal.     Mouth/Throat:     Pharynx: Oropharynx is clear.  Eyes:     Pupils: Pupils are equal, round, and reactive to light.  Cardiovascular:     Rate and Rhythm: Normal rate.  Pulmonary:     Effort: Pulmonary effort is normal.  Genitourinary:    Comments: Deferred Musculoskeletal:        General: Normal range of motion.     Cervical back: Normal range of motion.  Skin:    General: Skin is warm and dry.  Neurological:     General: No focal deficit present.     Mental Status: He is alert and oriented to person, place, and time.    Review of Systems  Constitutional:  Negative for chills, diaphoresis and fever.   HENT:  Negative for congestion and sore throat.   Cardiovascular:  Negative for chest pain and palpitations.  Gastrointestinal:  Negative for abdominal pain, constipation, diarrhea, heartburn, nausea and vomiting.  Genitourinary:  Negative for dysuria.  Musculoskeletal:  Negative for joint pain and myalgias.  Skin:  Negative for itching and rash.  Neurological:  Negative for dizziness, tingling, tremors, sensory change, speech change, focal weakness, seizures, loss of consciousness, weakness and headaches.  Endo/Heme/Allergies:        See allergy lists.  Psychiatric/Behavioral:  Positive for depression and substance abuse. Negative for hallucinations (Hx of), memory loss and suicidal ideas (Hx of (stabilizing)). The patient is not nervous/anxious and does not have insomnia.    Blood pressure (!) 114/90, pulse (!) 109, temperature 98.7 F (37.1 C), resp. rate 16, height 5\' 8"  (1.727 m), weight 57.2 kg, SpO2 97 %. Body mass index is 19.16 kg/m.  Treatment Plan Summary: Daily contact with patient to assess and evaluate symptoms and progress in treatment and Medication management.   Continue inpatient hospitalization.  Will continue today 05/23/2022 plan as below except where it is noted.   Diagnoses: 1. Schizophrenia Spectrum disorder with psychotic features type not yet determined. 2. Major depressive disorder.   Plan: Continue Haldol 10 mg po Q bedtime for mood control.  Continue Cogentin 0.5 mg po bid for eps.  Continue Fluoxetine 20 mg po daily for depression.  Gabapentin 100 mg po tid for agitation.  Continue Trazodone 50 mg po Q hs prn for insomnia. Continue nicotine patch 14 mg trans-dermally Q 24 hrs for nicotine withdrawal.    Other PRNS -Continue Tylenol 650 mg every 6 hours PRN for mild pain -Continue Maalox 30 ml Q 4 hrs PRN for indigestion -Continue MOM 30 ml po Q 6 hrs for constipation   Safety and Monitoring: Voluntary admission to inpatient psychiatric unit for  safety, stabilization and treatment Daily contact with patient to assess and evaluate symptoms and progress in treatment Patient's case to be discussed in multi-disciplinary team meeting Observation Level : q15 minute checks Vital signs: q12 hours  Precautions: Safety   Discharge Planning: Social work and case management to assist with discharge planning and identification of hospital follow-up needs prior to discharge Estimated LOS: 5-7 days Discharge Concerns: Need to establish a safety plan; Medication compliance and effectiveness Discharge Goals: Return home with outpatient referrals for mental health follow-up including medication management/psychotherapy  Armandina Stammer, NP, pmhnp, fnp-bc 05/23/2022, 3:00 PMPatient ID: Billy Rose, male   DOB: 2000-12-11, 21 y.o.   MRN: [id number]

## 2022-05-23 NOTE — [facility] Group Notes (Signed)
RN Group Note  Group Date: 05/23/2022 Start Time: 1:30pm End Time: 1:55pm   Type of Therapy and Topic:  Group Note: Positive Affirmations  Participation Level:  Did not participate.   Description of Group:   This group addressed positive affirmation towards self and others.  Patients went around the room and identified two positive things about themselves and two positive things about a peer in the room.  Patients reflected on how it felt to share something positive with others, to identify positive things about themselves, and to hear positive things from others/ Patients were encouraged to have a daily reflection of positive characteristics or circumstances.   Therapeutic Goals: Patients will verbalize two of their positive qualities Patients will demonstrate empathy for others by stating two positive qualities about a peer in the group Patients will verbalize their feelings when voicing positive self affirmations and when voicing positive affirmations of others Patients will discuss the potential positive impact on their wellness/recovery of focusing on positive traits of self and others.  Summary of Patient Progress:  Patient was invited to group but did not attend. 

## 2022-05-23 NOTE — Progress Notes (Signed)
   05/23/22 2100  Psych Admission Type (Psych Patients Only)  Admission Status Voluntary  Psychosocial Assessment  Patient Complaints None  Eye Contact Fair  Facial Expression Flat  Affect Appropriate to circumstance  Speech Logical/coherent  Interaction Assertive  Motor Activity Slow  Appearance/Hygiene Unremarkable  Behavior Characteristics Appropriate to situation  Mood Pleasant  Thought Process  Coherency WDL  Content WDL  Delusions None reported or observed  Perception WDL  Hallucination None reported or observed  Judgment Impaired  Confusion None  Danger to Self  Current suicidal ideation? Denies  Danger to Others  Danger to Others None reported or observed

## 2022-05-23 NOTE — Progress Notes (Signed)
[facility] LCSW Progress Note   05/23/2022    2:57 PM  Billy Rose      Type of Note: Returned Call back to [employer]    CSW returned patient mom [name] call. [name] informed CSW that patient called her saying that he will be DC tomorrow. [name] then states that she will going to be out of town and cannot pick him up. However, since he will be DC tomorrow she is going to drop patient car off this evening in the parking lot, so he can drive himself home. CSW informed this information to staff.     Signed:   Jacob Moores, MSW, [facility] 05/23/2022 2:57 PM

## 2022-05-23 NOTE — Progress Notes (Addendum)
   On assessment, patient presents with no complaints, but presents with moderate anxiety and depression.  Patient is observed at the bedside.  Patient denies SI/HI/AVH.  Patient contracts for safety.  Administered PRN Trazodone per MAR per pt request.  Provided emotional support.  Patient is presently safe on the unit with Q 15 minute safety checks.

## 2022-05-23 NOTE — Progress Notes (Signed)
   05/23/22 0604  Sleep  Number of Hours 9.5

## 2022-05-24 DIAGNOSIS — F29 Unspecified psychosis not due to a substance or known physiological condition: Secondary | ICD-10-CM

## 2022-05-24 MED ORDER — NICOTINE 14 MG/24HR TD PT24: 14.0000 mg | MEDICATED_PATCH | Freq: Every day | TRANSDERMAL | 0 refills | Status: DC

## 2022-05-24 MED ORDER — GABAPENTIN 100 MG PO CAPS: 100.0000 mg | ORAL_CAPSULE | Freq: Three times a day (TID) | ORAL | 0 refills | Status: DC

## 2022-05-24 MED ORDER — TRAZODONE HCL 50 MG PO TABS: 50.0000 mg | ORAL_TABLET | Freq: Every evening | ORAL | 0 refills | Status: DC | PRN

## 2022-05-24 MED ORDER — FLUOXETINE HCL 20 MG PO CAPS: 20.0000 mg | ORAL_CAPSULE | Freq: Every day | ORAL | 0 refills | Status: DC

## 2022-05-24 MED ORDER — HALOPERIDOL 10 MG PO TABS: 10.0000 mg | ORAL_TABLET | Freq: Two times a day (BID) | ORAL | 0 refills | Status: DC

## 2022-05-24 MED ORDER — BENZTROPINE MESYLATE 0.5 MG PO TABS: 0.5000 mg | ORAL_TABLET | Freq: Two times a day (BID) | ORAL | 0 refills | Status: DC

## 2022-05-24 NOTE — Progress Notes (Signed)
Pt discharged to lobby. Pt was stable and appreciative at that time. All papers given and electronic prescriptions were sent and valuables returned. Suicide safety plan completed and reviewed with nurse. Verbal understanding expressed. Denies SI/HI and A/VH. Pt given opportunity to express concerns and ask questions.

## 2022-05-24 NOTE — Progress Notes (Signed)
  [facility] Adult Case Management Discharge Plan :  Will you be returning to the same living situation after discharge:  Yes,  his own apartment At discharge, do you have transportation home?: Yes,  car is in parking lot Do you have the ability to pay for your medications: Yes,  has medicaid  Release of information consent forms completed and emailed to Medical Records, then turned in to Medical Records by CSW.   Patient to Follow up at:  Follow-up Information     [facility] Follow up.   Why: Please go to your next appointment with Francee Piccolo.  If needed, you can go to the clinic on a walk-in basis. Contact information: [address]  [address] [place] [address] [phone]                 Next level of care provider has access to [facility] Link:no  Safety Planning and Suicide Prevention discussed: Yes,  with mother     Has patient been referred to the Quitline?: Patient refused referral  Patient has been referred for addiction treatment: Pt. refused referral  [name], LCSW 05/24/2022, 10:30 AM

## 2022-05-24 NOTE — Discharge Summary (Signed)
Physician Discharge Summary Note  Patient:  Billy Rose is an 21 y.o., male MRN:  [id number] DOB:  10/31/2000 Patient phone:  [phone] (home)  Patient address:   [address] [place] [address],  Total Time spent with patient: 20 minutes  Date of Admission:  05/20/2022 Date of Discharge: 05/24/2022  Reason for Admission:    This is one several admission assessments in this [facility]/other health systems  for this 21 year old male with hx of mental illness & substance use disorder. He is admitted to the [facility] from the [facility] ED with complain of suicidal ideations with plans to electrocute himself in a bathtub. Chart review indicated that patient reported upon arrival to the ED that he had leg tremors since running out of his cogentin. After medical evaluation & stabilization, he was recommended for further psychiatric evaluation & treatments. He was transferred to the [facility]. A review of his lab results showed his UDS was positive for THC. During this evaluation, Ronell reports,    "The ambulance took me to the [facility] 2 days ago. My leg would not stop moving for a whole week. I do not know why that was happening, so I called the ambulance that took me to the hospital. They fixed my problem at the other hospital before I came here.  I was told that the tremors was as a result of me running out of my cogentin. My leg stopped shaking because they re-started my cogentin.  My mood is fine. I take medicine for depression, schizophrenia & for the shakes. I was diagnosed with those disorders a long time ago. I cannot put a time on when I was first diagnosed. I know I'm better now. I am not feeling suicidal. I told them at the other hospital that I was suicidal because they keep asking me if I am. I told them that I was suicidal & I was going to electrocute myself. It is not like I will do or going to do it. I know everyone has thought about what it would be like to kill  yourself. That does not mean you will kill yourself. I know suicidal thoughts has crossed my mind, so are other people. I go to [facility] for my mental heath care. I'm ready to be discharged. I'm ready to get out of here. I'm no feeling SIHI, no voices or seeing things. I'm just good".   Collateral information: Attempted to obtain collateral information, unsuccessful. Called patient's mother, Desiderio Dolata [phone]. She is unavailable. Her answering service is full.  Principal Problem: Schizophrenia spectrum disorder with psychotic disorder type not yet determined [facility]) Discharge Diagnoses: Principal Problem:   Schizophrenia spectrum disorder with psychotic disorder type not yet determined (HCC) Active Problems:   Major depressive disorder, recurrent severe without psychotic features (HCC)   Past Psychiatric History:  Schizophrenia spectrum disorders Major depressive disorder  Past Medical History:  Past Medical History:  Diagnosis Date   Headache    MDD (major depressive disorder), recurrent, severe, with psychosis (HCC)    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 No pertinent surgical history. Family History:  Family History  Problem Relation Age of Onset   Allergic rhinitis Neg Hx    Asthma Neg Hx    Eczema Neg Hx    Urticaria Neg Hx    Family Psychiatric  History:  Unknown  Social History:  Social History  Substance and Sexual Activity  Alcohol Use Yes   Comment: "4 beers every so often"     Social History   Substance and Sexual Activity  Drug Use Not Currently   Types: Cocaine, LSD, Marijuana, IV    Social History   Socioeconomic History   Marital status: Single    Spouse name: Not on file   Number of children: Not on file   Years of education: Not on file   Highest education level: Not on file  Occupational History   Not on file  Tobacco Use   Smoking status: Every Day    Packs/day:  1.00    Types: Cigarettes   Smokeless tobacco: Never  Vaping Use   Vaping Use: Never used  Substance and Sexual Activity   Alcohol use: Yes    Comment: "4 beers every so often"   Drug use: Not Currently    Types: Cocaine, LSD, Marijuana, IV   Sexual activity: Not Currently  Other Topics Concern   Not on file  Social History Narrative   ** Merged History Encounter **       Social Determinants of Health   Financial Resource Strain: Not on file  Food Insecurity: No Food Insecurity (05/20/2022)   Hunger Vital Sign    Worried About Running Out of Food in the Last Year: Never true    Ran Out of Food in the Last Year: Never true  Transportation Needs: No Transportation Needs (05/20/2022)   PRAPARE - Administrator, Civil ServiceTransportation    Lack of Transportation (Medical): No    Lack of Transportation (Non-Medical): No  Physical Activity: Not on file  Stress: Not on file  Social Connections: Not on file    Hospital Course:    During the patient's hospitalization, patient had extensive initial psychiatric evaluation, and follow-up psychiatric evaluations every day.   Psychiatric diagnoses provided upon initial assessment: Schizophrenia spectrum disorder (with psychotic features not yet determined) and major depressive disorder   Patient's psychiatric medications were adjusted on admission:  -Restarted Haldol 10 mg at bedtime -Restarted Cogentin 0.5 mg 2 times daily -Started fluoxetine 20 mg daily -Started gabapentin 100 mg 3 times daily -Started trazodone 50 mg at bedtime as needed   During the hospitalization, other adjustments were made to the patient's psychiatric medication regimen:  -Continued Haldol 10 mg at bedtime -Continued Cogentin 0.5 mg 2 times daily -Continued fluoxetine 20 mg daily -Continued gabapentin 100 mg 3 times daily -Continued trazodone 50 mg at bedtime as needed   Patient's care was discussed during the interdisciplinary team meeting every day during the  hospitalization.   The patient denied having side effects to prescribed psychiatric medication.   Gradually, patient started adjusting to milieu. The patient was evaluated each day by a clinical provider to ascertain response to treatment. Improvement was noted by the patient's report of decreasing symptoms, improved sleep and appetite, affect, medication tolerance, behavior, and participation in unit programming.  Patient was asked each day to complete a self inventory noting mood, mental status, pain, new symptoms, anxiety and concerns.     Symptoms were reported as significantly decreased or resolved completely by discharge.    On day of discharge, the patient reports that their mood is stable. The patient denied having suicidal thoughts for more than 48 hours prior to discharge.  Patient denies having homicidal thoughts.  Patient denies having auditory hallucinations.  Patient denies any visual hallucinations or other symptoms of psychosis. The patient was motivated to continue taking medication with a  goal of continued improvement in mental health.    The patient reports their target psychiatric symptoms of schizophrenia spectrum disorder and major depressive disorder responded well to the psychiatric medications, and the patient reports overall benefit other psychiatric hospitalization. Supportive psychotherapy was provided to the patient. The patient also participated in regular group therapy while hospitalized. Coping skills, problem solving as well as relaxation therapies were also part of the unit programming.   Labs were reviewed with the patient, and abnormal results were discussed with the patient.   The patient is able to verbalize their individual safety plan to this provider.   # It is recommended to the patient to continue psychiatric medications as prescribed, after discharge from the hospital.     # It is recommended to the patient to follow up with your outpatient psychiatric  provider and PCP.   # It was discussed with the patient, the impact of alcohol, drugs, tobacco have been there overall psychiatric and medical wellbeing, and total abstinence from substance use was recommended the patient.ed.   # Prescriptions provided or sent directly to preferred pharmacy at discharge. Patient agreeable to plan. Given opportunity to ask questions. Appears to feel comfortable with discharge.    # In the event of worsening symptoms, the patient is instructed to call the crisis hotline, 911 and or go to the nearest ED for appropriate evaluation and treatment of symptoms. To follow-up with primary care provider for other medical issues, concerns and or health care needs   # Patient was discharged home with a plan to follow up as noted below.  Physical Findings: AIMS:  , ,  ,  ,    CIWA:    COWS:     Musculoskeletal: Strength & Muscle Tone: within normal limits Gait & Station: normal Patient leans: N/A   Psychiatric Specialty Exam:  Presentation  General Appearance:  Appropriate for Environment; Fairly Groomed  Eye Contact: Good  Speech: Clear and Coherent; Normal Rate  Speech Volume: Normal  Handedness: Right   Mood and Affect  Mood: Euthymic  Affect: Appropriate   Thought Process  Thought Processes: Coherent; Goal Directed  Descriptions of Associations:Intact  Orientation:Full (Time, Place and Person)  Thought Content:Logical  History of Schizophrenia/Schizoaffective disorder:No data recorded Duration of Psychotic Symptoms:No data recorded Hallucinations:Hallucinations: None  Ideas of Reference:None  Suicidal Thoughts:Suicidal Thoughts: No SI Active Intent and/or Plan: Without Intent; Without Plan; Without Means to Carry Out; Without Access to Means  Homicidal Thoughts:Homicidal Thoughts: No   Sensorium  Memory: Immediate Good; Recent Good; Remote Good  Judgment: Good  Insight: Good   Executive Functions   Concentration: Good  Attention Span: Good  Recall: Good  Fund of Knowledge: Good  Language: Good   Psychomotor Activity  Psychomotor Activity: Psychomotor Activity: Normal   Assets  Assets: Communication Skills; Desire for Improvement; Housing; Health and safety inspector; Resilience; Social Support   Sleep  Sleep: Sleep: Good Number of Hours of Sleep: 9.5    Physical Exam: Physical Exam Constitutional:      Appearance: Normal appearance.  HENT:     Head: Normocephalic and atraumatic.     Nose: Nose normal.     Mouth/Throat:     Mouth: Mucous membranes are moist.  Eyes:     Extraocular Movements: Extraocular movements intact.     Pupils: Pupils are equal, round, and reactive to light.  Cardiovascular:     Rate and Rhythm: Normal rate and regular rhythm.  Pulmonary:     Effort: Pulmonary effort is  normal.     Breath sounds: Normal breath sounds.  Abdominal:     General: Abdomen is flat.  Musculoskeletal:        General: Normal range of motion.     Cervical back: Normal range of motion and neck supple.  Skin:    General: Skin is warm and dry.  Neurological:     General: No focal deficit present.     Mental Status: He is alert and oriented to person, place, and time.  Psychiatric:        Attention and Perception: Attention and perception normal. He does not perceive auditory or visual hallucinations.        Mood and Affect: Affect normal. Mood is anxious. Mood is not depressed.        Speech: Speech normal.        Behavior: Behavior normal. Behavior is cooperative.        Thought Content: Thought content normal. Thought content is not paranoid or delusional. Thought content does not include homicidal or suicidal ideation.        Cognition and Memory: Cognition and memory normal.        Judgment: Judgment normal.    Review of Systems  Constitutional: Negative.   HENT: Negative.    Eyes: Negative.   Respiratory: Negative.    Cardiovascular:  Negative.   Gastrointestinal: Negative.   Skin: Negative.   Neurological: Negative.   Psychiatric/Behavioral:  Positive for substance abuse. Negative for depression, hallucinations and suicidal ideas. The patient is not nervous/anxious and does not have insomnia.    Blood pressure 125/74, pulse 85, temperature 99.9 F (37.7 C), resp. rate 16, height 5\' 8"  (1.727 m), weight 57.2 kg, SpO2 98 %. Body mass index is 19.16 kg/m.   Social History   Tobacco Use  Smoking Status Every Day   Packs/day: 1.00   Types: Cigarettes  Smokeless Tobacco Never   Tobacco Cessation:  A prescription for an FDA-approved tobacco cessation medication provided at discharge   Blood Alcohol level:  Lab Results  Component Value Date   ETH <10 05/19/2022   ETH <10 02/26/2022    Metabolic Disorder Labs:  Lab Results  Component Value Date   HGBA1C 5.1 02/07/2022   MPG 99.67 02/07/2022   MPG 102.54 10/10/2020   Lab Results  Component Value Date   PROLACTIN 24.1 (H) 05/22/2020   PROLACTIN 32.1 (H) 02/15/2017   Lab Results  Component Value Date   CHOL 128 02/07/2022   TRIG 81 02/07/2022   HDL 51 02/07/2022   CHOLHDL 2.5 02/07/2022   VLDL 16 02/07/2022   LDLCALC 61 02/07/2022   LDLCALC 52 10/10/2020    See Psychiatric Specialty Exam and Suicide Risk Assessment completed by Attending Physician prior to discharge.  Discharge destination:  Home  Is patient on multiple antipsychotic therapies at discharge:  No   Has Patient had three or more failed trials of antipsychotic monotherapy by history:  No  Recommended Plan for Multiple Antipsychotic Therapies: NA  Discharge Instructions     Diet - low sodium heart healthy   Complete by: As directed    Increase activity slowly   Complete by: As directed       Allergies as of 05/24/2022       Reactions   Penicillins Anaphylaxis, Swelling, Other (See Comments)   Did it involve swelling of the face/tongue/throat, SOB, or low BP? Yes Did it  involve sudden or severe rash/hives, skin peeling, or any reaction on the inside  of your mouth or nose? Unk Did you need to seek medical attention at a hospital or doctor's office? Unk When did it last happen? Childhood If all above answers are "NO", may proceed with cephalosporin use.   Sulfa Antibiotics Anaphylaxis, Shortness Of Breath, Swelling, Rash, Other (See Comments)   Throat and face swell   Abilify [aripiprazole] Other (See Comments)   Worsened the patient        Medication List     STOP taking these medications    multivitamin capsule       TAKE these medications      Indication  benztropine 0.5 MG tablet Commonly known as: COGENTIN Take 1 tablet (0.5 mg total) by mouth 2 (two) times daily.  Indication: Extrapyramidal Reaction caused by Medications   FLUoxetine 20 MG capsule Commonly known as: PROZAC Take 1 capsule (20 mg total) by mouth daily.  Indication: Depression   gabapentin 100 MG capsule Commonly known as: NEURONTIN Take 1 capsule (100 mg total) by mouth 3 (three) times daily.  Indication: Generalized Anxiety Disorder   haloperidol 10 MG tablet Commonly known as: HALDOL Take 1 tablet (10 mg total) by mouth 2 (two) times daily.  Indication: Psychosis   nicotine 14 mg/24hr patch Commonly known as: NICODERM CQ - dosed in mg/24 hours Place 1 patch (14 mg total) onto the skin daily. Start taking on: May 25, 2022  Indication: Nicotine Addiction   traZODone 50 MG tablet Commonly known as: DESYREL Take 1 tablet (50 mg total) by mouth at bedtime as needed for sleep.  Indication: Trouble Sleeping        Follow-up Information     [facility] Follow up.   Why: Please go to your next appointment with Francee Piccolo.  If needed, you can go to the clinic on a walk-in basis. Contact information: [address]  [address] [place] [address] [phone]                 Follow-up recommendations:    Activity: As tolerated  Diet: Heart  healthy  Other: -Follow-up with your outpatient psychiatric provider -instructions on appointment date, time, and address (location) are provided to you in discharge paperwork.   -Take your psychiatric medications as prescribed at discharge - instructions are provided to you in the discharge paperwork.    -Follow-up with outpatient primary care doctor and other specialists -for management of preventative medicine and chronic medical disease, including:   -Testing: Follow-up with outpatient provider for abnormal lab results:  none   -Recommend abstinence from alcohol, tobacco, and other illicit drug use at discharge.    -If your psychiatric symptoms recur, worsen, or if you have side effects to your psychiatric medications, call your outpatient psychiatric provider, 911, [phone] or go to the nearest emergency department.   -If suicidal thoughts recur, call your outpatient psychiatric provider, 911, [phone] or go to the nearest emergency department.   Signed: Meta Hatchet, PA 05/24/2022, 10:48 AM

## 2022-05-24 NOTE — Progress Notes (Signed)
   05/24/22 1100  Psych Admission Type (Psych Patients Only)  Admission Status Voluntary  Psychosocial Assessment  Patient Complaints None  Eye Contact Fair  Facial Expression Flat  Affect Appropriate to circumstance  Speech Logical/coherent  Interaction Assertive  Motor Activity Slow  Appearance/Hygiene Unremarkable  Behavior Characteristics Cooperative;Appropriate to situation  Mood Pleasant  Thought Process  Coherency WDL  Content WDL  Delusions None reported or observed  Perception WDL  Hallucination None reported or observed  Judgment Impaired  Confusion None  Danger to Self  Current suicidal ideation? Denies  Danger to Others  Danger to Others None reported or observed

## 2022-05-24 NOTE — [facility] Suicide Risk Assessment (Signed)
Suicide Risk Assessment  Discharge Assessment    [facility] Discharge Suicide Risk Assessment   Principal Problem: Schizophrenia spectrum disorder with psychotic disorder type not yet determined [facility]) Discharge Diagnoses: Principal Problem:   Schizophrenia spectrum disorder with psychotic disorder type not yet determined (HCC) Active Problems:   Major depressive disorder, recurrent severe without psychotic features (HCC)   HPI:  Billy Rose is a 21 year old, Caucasian male with a past psychiatric history significant for substance use disorder who was admitted to [facility] from [place] ED with a chief complaint of suicidal ideations.   HOSPITAL COURSE:  During the patient's hospitalization, patient had extensive initial psychiatric evaluation, and follow-up psychiatric evaluations every day.  Psychiatric diagnoses provided upon initial assessment: Schizophrenia spectrum disorder (with psychotic features not yet determined) and major depressive disorder  Patient's psychiatric medications were adjusted on admission:  -Restarted Haldol 10 mg at bedtime -Restarted Cogentin 0.5 mg 2 times daily -Started fluoxetine 20 mg daily -Started gabapentin 100 mg 3 times daily -Started trazodone 50 mg at bedtime as needed  During the hospitalization, other adjustments were made to the patient's psychiatric medication regimen:  -Continued Haldol 10 mg at bedtime -Continued Cogentin 0.5 mg 2 times daily -Continued fluoxetine 20 mg daily -Continued gabapentin 100 mg 3 times daily -Continued trazodone 50 mg at bedtime as needed  Patient's care was discussed during the interdisciplinary team meeting every day during the hospitalization.  The patient denied having side effects to prescribed psychiatric medication.  Gradually, patient started adjusting to milieu. The patient was evaluated each day by a clinical provider to ascertain response to treatment. Improvement was noted by the  patient's report of decreasing symptoms, improved sleep and appetite, affect, medication tolerance, behavior, and participation in unit programming.  Patient was asked each day to complete a self inventory noting mood, mental status, pain, new symptoms, anxiety and concerns.    Symptoms were reported as significantly decreased or resolved completely by discharge.   On day of discharge, the patient reports that their mood is stable. The patient denied having suicidal thoughts for more than 48 hours prior to discharge.  Patient denies having homicidal thoughts.  Patient denies having auditory hallucinations.  Patient denies any visual hallucinations or other symptoms of psychosis. The patient was motivated to continue taking medication with a goal of continued improvement in mental health.   The patient reports their target psychiatric symptoms of schizophrenia spectrum disorder and major depressive disorder responded well to the psychiatric medications, and the patient reports overall benefit other psychiatric hospitalization. Supportive psychotherapy was provided to the patient. The patient also participated in regular group therapy while hospitalized. Coping skills, problem solving as well as relaxation therapies were also part of the unit programming.  Labs were reviewed with the patient, and abnormal results were discussed with the patient.  The patient is able to verbalize their individual safety plan to this provider.  # It is recommended to the patient to continue psychiatric medications as prescribed, after discharge from the hospital.    # It is recommended to the patient to follow up with your outpatient psychiatric provider and PCP.  # It was discussed with the patient, the impact of alcohol, drugs, tobacco have been there overall psychiatric and medical wellbeing, and total abstinence from substance use was recommended the patient.ed.  # Prescriptions provided or sent directly to  preferred pharmacy at discharge. Patient agreeable to plan. Given opportunity to ask questions. Appears to feel comfortable with discharge.    #  In the event of worsening symptoms, the patient is instructed to call the crisis hotline, 911 and or go to the nearest ED for appropriate evaluation and treatment of symptoms. To follow-up with primary care provider for other medical issues, concerns and or health care needs  # Patient was discharged home with a plan to follow up as noted below.    Total Time spent with patient: 20 minutes  Musculoskeletal: Strength & Muscle Tone: within normal limits Gait & Station: normal Patient leans: N/A  Psychiatric Specialty Exam  Presentation  General Appearance:  Appropriate for Environment; Fairly Groomed  Eye Contact: Good  Speech: Clear and Coherent; Normal Rate  Speech Volume: Normal  Handedness: Right   Mood and Affect  Mood: Euthymic  Duration of Depression Symptoms: Greater than two weeks  Affect: Appropriate   Thought Process  Thought Processes: Coherent; Goal Directed  Descriptions of Associations:Intact  Orientation:Full (Time, Place and Person)  Thought Content:Logical  History of Schizophrenia/Schizoaffective disorder:No data recorded Duration of Psychotic Symptoms:No data recorded Hallucinations:Hallucinations: None  Ideas of Reference:None  Suicidal Thoughts:Suicidal Thoughts: No SI Active Intent and/or Plan: Without Intent; Without Plan; Without Means to Carry Out; Without Access to Means  Homicidal Thoughts:Homicidal Thoughts: No   Sensorium  Memory: Immediate Good; Recent Good; Remote Good  Judgment: Good  Insight: Good   Executive Functions  Concentration: Good  Attention Span: Good  Recall: Good  Fund of Knowledge: Good  Language: Good   Psychomotor Activity  Psychomotor Activity: Psychomotor Activity: Normal   Assets  Assets: Communication Skills; Desire for  Improvement; Housing; Health and safety inspector; Resilience; Social Support   Sleep  Sleep: Sleep: Good Number of Hours of Sleep: 9.5   Physical Exam: Physical Exam Constitutional:      Appearance: Normal appearance.  HENT:     Head: Normocephalic and atraumatic.     Nose: Nose normal.     Mouth/Throat:     Mouth: Mucous membranes are moist.  Eyes:     Extraocular Movements: Extraocular movements intact.     Pupils: Pupils are equal, round, and reactive to light.  Cardiovascular:     Rate and Rhythm: Normal rate and regular rhythm.  Pulmonary:     Effort: Pulmonary effort is normal.     Breath sounds: Normal breath sounds.  Abdominal:     General: Abdomen is flat.  Musculoskeletal:        General: Normal range of motion.     Cervical back: Normal range of motion and neck supple.  Skin:    General: Skin is warm and dry.  Neurological:     General: No focal deficit present.     Mental Status: He is alert and oriented to person, place, and time.  Psychiatric:        Attention and Perception: Attention and perception normal. He does not perceive auditory or visual hallucinations.        Mood and Affect: Affect normal. Mood is anxious. Mood is not depressed.        Speech: Speech normal.        Behavior: Behavior normal. Behavior is cooperative.        Thought Content: Thought content normal. Thought content is not paranoid or delusional. Thought content does not include homicidal or suicidal ideation.        Cognition and Memory: Cognition and memory normal.        Judgment: Judgment normal.    Review of Systems  Constitutional: Negative.   HENT: Negative.  Eyes: Negative.   Respiratory: Negative.    Cardiovascular: Negative.   Gastrointestinal: Negative.   Skin: Negative.   Neurological: Negative.   Psychiatric/Behavioral:  Positive for substance abuse. Negative for depression, hallucinations and suicidal ideas. The patient is not nervous/anxious and does not  have insomnia.    Blood pressure 125/74, pulse 85, temperature 99.9 F (37.7 C), resp. rate 16, height 5\' 8"  (1.727 m), weight 57.2 kg, SpO2 98 %. Body mass index is 19.16 kg/m.  Mental Status Per Nursing Assessment::   On Admission:  NA  Demographic factors:  Male, Living alone, Caucasian Loss Factors:  NA Historical Factors:  Family history of suicide, Family history of mental illness or substance abuse Risk Reduction Factors:  Religious beliefs about death, Positive social support  Suicide Risk:  Mild: There are no identifiable plans, no associated intent, mild dysphoria and related symptoms, good self-control (both objective and subjective assessment), few other risk factors, and identifiable protective factors, including available and accessible social support.   Follow-up Information     [facility].   Contact information: [address]  [address] [place] [phone]                 Plan Of Care/Follow-up recommendations:   Activity: As tolerated  Diet: Heart healthy  Other: -Follow-up with your outpatient psychiatric provider -instructions on appointment date, time, and address (location) are provided to you in discharge paperwork.   -Take your psychiatric medications as prescribed at discharge - instructions are provided to you in the discharge paperwork.    -Follow-up with outpatient primary care doctor and other specialists -for management of preventative medicine and chronic medical disease, including:   -Testing: Follow-up with outpatient provider for abnormal lab results:  none   -Recommend abstinence from alcohol, tobacco, and other illicit drug use at discharge.    -If your psychiatric symptoms recur, worsen, or if you have side effects to your psychiatric medications, call your outpatient psychiatric provider, 911, [phone] or go to the nearest emergency department.   -If suicidal thoughts recur, call your outpatient  psychiatric provider, 911, [phone] or go to the nearest emergency department.   [phone], [place] 05/24/2022, 10:25 AM

## 2023-02-13 ENCOUNTER — Ambulatory Visit (HOSPITAL_COMMUNITY)
Admission: EM | Admit: 2023-02-13 | Discharge: 2023-02-14 | Disposition: A | Discharge status: Other Acute Inpatient Hospital | Payer: MEDICAID | Attending: Behavioral Health | Admitting: Behavioral Health

## 2023-02-13 DIAGNOSIS — G4700 Insomnia, unspecified: Secondary | ICD-10-CM | POA: Insufficient documentation

## 2023-02-13 DIAGNOSIS — F259 Schizoaffective disorder, unspecified: Secondary | ICD-10-CM | POA: Insufficient documentation

## 2023-02-13 DIAGNOSIS — F250 Schizoaffective disorder, bipolar type: Secondary | ICD-10-CM | POA: Diagnosis present

## 2023-02-13 DIAGNOSIS — F1210 Cannabis abuse, uncomplicated: Secondary | ICD-10-CM | POA: Insufficient documentation

## 2023-02-13 DIAGNOSIS — F4310 Post-traumatic stress disorder, unspecified: Secondary | ICD-10-CM | POA: Insufficient documentation

## 2023-02-13 DIAGNOSIS — F32A Depression, unspecified: Secondary | ICD-10-CM | POA: Insufficient documentation

## 2023-02-13 LAB — COMPREHENSIVE METABOLIC PANEL
ALT: 17 U/L (ref 0–44)
AST: 18 U/L (ref 15–41)
Albumin: 4.5 g/dL (ref 3.5–5.0)
Alkaline Phosphatase: 66 U/L (ref 38–126)
Anion gap: 18 — ABNORMAL HIGH (ref 5–15)
BUN: 15 mg/dL (ref 6–20)
CO2: 25 mmol/L (ref 22–32)
Calcium: 9.9 mg/dL (ref 8.9–10.3)
Chloride: 98 mmol/L (ref 98–111)
Creatinine, Ser: 0.99 mg/dL (ref 0.61–1.24)
GFR, Estimated: >60 mL/min (ref >60)
Glucose, Bld: 89 mg/dL (ref 70–99)
Potassium: 4.2 mmol/L (ref 3.5–5.1)
Sodium: 141 mmol/L (ref 135–145)
Total Bilirubin: 0.8 mg/dL (ref 0.3–1.2)
Total Protein: 7.3 g/dL (ref 6.5–8.1)

## 2023-02-13 LAB — CBC WITH DIFFERENTIAL/PLATELET
Abs Immature Granulocytes: 0.03 10*3/uL (ref 0.00–0.07)
Basophils Absolute: 0 10*3/uL (ref 0.0–0.1)
Basophils Relative: 1 %
Eosinophils Absolute: 0.3 10*3/uL (ref 0.0–0.5)
Eosinophils Relative: 4 %
HCT: 44.8 % (ref 39.0–52.0)
Hemoglobin: 15 g/dL (ref 13.0–17.0)
Immature Granulocytes: 1 %
Lymphocytes Relative: 35 %
Lymphs Abs: 2.3 10*3/uL (ref 0.7–4.0)
MCH: 29.3 pg (ref 26.0–34.0)
MCHC: 33.5 g/dL (ref 30.0–36.0)
MCV: 87.5 fL (ref 80.0–100.0)
Monocytes Absolute: 0.5 10*3/uL (ref 0.1–1.0)
Monocytes Relative: 7 %
Neutro Abs: 3.4 10*3/uL (ref 1.7–7.7)
Neutrophils Relative %: 52 %
Platelets: 238 10*3/uL (ref 150–400)
RBC: 5.12 MIL/uL (ref 4.22–5.81)
RDW: 12 % (ref 11.5–15.5)
WBC: 6.4 10*3/uL (ref 4.0–10.5)
nRBC: 0 % (ref 0.0–0.2)

## 2023-02-13 LAB — POCT URINE DRUG SCREEN - MANUAL ENTRY (I-SCREEN)
POC Amphetamine UR: NOT DETECTED
POC Buprenorphine (BUP): NOT DETECTED
POC Cocaine UR: NOT DETECTED
POC Marijuana UR: NOT DETECTED
POC Methadone UR: NOT DETECTED
POC Methamphetamine UR: NOT DETECTED
POC Morphine: NOT DETECTED
POC Oxazepam (BZO): NOT DETECTED
POC Oxycodone UR: NOT DETECTED
POC Secobarbital (BAR): NOT DETECTED

## 2023-02-13 LAB — LIPID PANEL
Cholesterol: 151 mg/dL (ref 0–200)
HDL: 56 mg/dL (ref >40)
LDL Cholesterol: 75 mg/dL (ref 0–99)
Total CHOL/HDL Ratio: 2.7 ratio
Triglycerides: 100 mg/dL (ref <150)
VLDL: 20 mg/dL (ref 0–40)

## 2023-02-13 LAB — ETHANOL: Alcohol, Ethyl (B): <10 mg/dL (ref <10)

## 2023-02-13 LAB — TSH: TSH: 0.84 u[IU]/mL (ref 0.350–4.500)

## 2023-02-13 MED ORDER — BENZTROPINE MESYLATE 0.5 MG PO TABS: 0.5000 mg | ORAL_TABLET | Freq: Two times a day (BID) | ORAL | Status: DC | PRN

## 2023-02-13 MED ORDER — ACETAMINOPHEN 325 MG PO TABS: 650.0000 mg | ORAL_TABLET | Freq: Four times a day (QID) | ORAL | Status: DC | PRN

## 2023-02-13 MED ORDER — ALUM & MAG HYDROXIDE-SIMETH 200-200-20 MG/5ML PO SUSP: 30.0000 mL | ORAL | Status: DC | PRN

## 2023-02-13 MED ORDER — HYDROXYZINE HCL 25 MG PO TABS
25.0000 mg | ORAL_TABLET | Freq: Three times a day (TID) | ORAL | Status: DC | PRN
  Administered 2023-02-13: 25 mg via ORAL
  Filled 2023-02-13: qty 1

## 2023-02-13 MED ORDER — TRAZODONE HCL 50 MG PO TABS
50.0000 mg | ORAL_TABLET | Freq: Every evening | ORAL | Status: DC | PRN
  Administered 2023-02-13: 50 mg via ORAL
  Filled 2023-02-13: qty 1

## 2023-02-13 MED ORDER — MAGNESIUM HYDROXIDE 400 MG/5ML PO SUSP: 30.0000 mL | Freq: Every day | ORAL | Status: DC | PRN

## 2023-02-13 MED ORDER — RISPERIDONE 0.5 MG PO TBDP
0.5000 mg | ORAL_TABLET | Freq: Every day | ORAL | Status: DC
  Administered 2023-02-13: 0.5 mg via ORAL
  Filled 2023-02-13: qty 1

## 2023-02-13 NOTE — ED Notes (Signed)
Patient is sleeping. Respirations equal and unlabored, skin warm and dry. No change in assessment or acuity. Routine safety checks conducted according to facility protocol. Will continue to monitor for safety.   

## 2023-02-13 NOTE — ED Notes (Signed)
Pt is in the bed resting. Respirations are even and unlabored. No acute distress noted. Will continue to monitor for safety and provide support.

## 2023-02-13 NOTE — ED Notes (Signed)
Pt admitted to observation unit due to paranoid delusional thinking. Pt states neighbors are trying to get to him through his walls. Mother states pt has not taken his medication in quite some time. Pt denies SI/HI/AVH.  Cooperative throughout interview process. Skin assessment completed. Oriented to unit. Meal and drink offered. Pt verbally contract for safety. Will monitor for safety.

## 2023-02-13 NOTE — Progress Notes (Signed)
   02/13/23 1534  [facility] Triage Screening (Walk-ins at [facility] only)  How Did You Hear About [place]? Self  What Is the Reason for Your Visit/Call Today? Pt presents to [facility] voluntarily accompanied by his mother due to delusions and insomnia. Per pts mother the pt is diagnosed with PTSD, Schizoaffective disorder and chronic depression. Per the pts mother the pt stopped taking his medications 6 months ago. Pts mother reports the pt was hospitalized in April for a "psychotic break". Pt reports he is stressed and has insomnia. He reports his stress is related to this issues that he has with neighbors. Pt reports "they are stealing from me", he also reports his neighbors are bothering him through the wall. He reports he only sleeps roughly 3 hours per night. Pt appears to be guarded, reports paranoia pertaining to his neighbors and is unable to stay on topic.Pt denies SI/HI and AVH.  How Long Has This Been Causing You Problems? 1-6 months  Have You Recently Had Any Thoughts About Hurting Yourself? No  Are You Planning to Commit Suicide/Harm Yourself At This time? No  Have you Recently Had Thoughts About Hurting Someone Karolee Ohs? No  Are You Planning To Harm Someone At This Time? No  Are you currently experiencing any auditory, visual or other hallucinations? No  Have You Used Any Alcohol or Drugs in the Past 24 Hours? No  Do you have any current medical co-morbidities that require immediate attention? No  Clinician description of patient physical appearance/behavior: guarded, unable to stay on topic  What Do You Feel Would Help You the Most Today? Treatment for Depression or other mood problem  If access to [facility] was not available, would you have sought care in the Emergency Department? No  Determination of Need Urgent (48 hours)  Options For Referral [facility];Outpatient Therapy;Medication Management

## 2023-02-13 NOTE — ED Provider Notes (Signed)
[facility] Urgent Care Continuous Assessment Admission H&P  Date: 02/13/23 Patient Name: Billy Rose MRN: [id number] Chief Complaint: Pt presents to [facility] voluntarily accompanied by his mother due to delusions and insomnia. Per pts mother the pt is diagnosed with PTSD, Schizoaffective disorder and chronic depression. Per the pts mother the pt stopped taking his medications 6 months ago. Pts mother reports the pt was hospitalized in April for a "psychotic break". Pt reports he is stressed and has insomnia. He reports his stress is related to this issues that he has with neighbors. Pt reports "they are stealing from me", he also reports his neighbors are bothering him through the wall. He reports he only sleeps roughly 3 hours per night. Pt appears to be guarded, reports paranoia pertaining to his neighbors and is unable to stay on topic.Pt denies SI/HI and AVH   Diagnoses:  Final diagnoses:  Schizoaffective disorder, unspecified type (HCC)    HPI: Billy Rose is a 22 year old male patient with a past psychiatric history significant for schizoaffective disorder, MDD, substance-induced psychosis, polysubstance abuse, cannabis use, and past intentional overdose in a suicide attempts who presents to the [facility] behavioral health urgent care voluntary accompanied by his mother Kuper Sivers [phone] with c/o delusions and insomnia.   Patient states that he has been under a lot of stress and having problems with sleeping because his neighbors are "very loud and people stealing." When asked to further elaborate, he states that they are stealing his food and unscrewing his stuff in the house. He states that he lives alone in an apartment. His mother interjects and states that he has a roommate. He states that he has been sleeping about 4 to 5 hours per night for the past 4 months. He denies taking prescribed medications and states that he did not like the way the medications were making him feel. He states  that he stopped taking his medications 1 year ago. His mom states that he was previously prescribed Haldol, Cogentin, and Prozac when he left the hospital but stopped taking his medications around April 2024. The patient states that the Haldol was making his legs shake uncontrollably. He states that he does not need to take medications because he is not psychotic. He states that he has anxiety and he needs someone to prescribe him medication for anxiety. He asked if he could be given ativan. Patient was offered hydroxyzine for anxiety, however, he declined. I explained to the patient that the hydroxyzine will be ordered as needed for anxiety. Patient denies suicidal ideations. He denies past suicide attempts. However, he does have a significant documented history of intentional overdoses on medication in suicide attempts. He reports self injurious behaviors but denies recent self-harm. He reports a history of cutting and shows this provider self inflicted cuts to his left forearm that appear to be old in nature. He states that he has not self harmed in 4 months. He denies homicidal ideations. He denies auditory or visual hallucinations. His mother interjects and tells him to be honest. She states that for the past 4 weeks he has been making lots of gestures, laughing at things out of the blue, talking to people who are not there, saying that he is talking to the voice of God. She states that he has also worn the same outfit everyday for the past 4 weeks without laundering it. She states that his behaviors have been going on for months. The patient does not deny his mother's complaints.  Patient denies  drinking alcohol or using illicit drugs. The patient's mother also states that the patient was following up with [facility] when he was discharged from [facility] around spring time and decided to stop taking his medications at that time. Patient is currently not established with outpatient psychiatry for medication  management or therapy.  She states that in the past the patient was prescribed lithium, Prozac, Zyprexa,  and that she is unsure about Risperdal. Patient states that in the past he was prescribed Ativan. Patient denies medical history.   On evaluation, patient is alert and oriented x 4. He is thought process is coherent with some disorganization in his thought process and delusional and paranoia thought content. His speech is coherent at a decreased tone. His mood is irritable and affect is congruent. He appears irritable but was cooperative throughout the assessment. He does not appear to be in acute distress.  Total Time spent with patient: 45 minutes  Musculoskeletal  Strength & Muscle Tone: within normal limits Gait & Station: normal Patient leans: N/A  Psychiatric Specialty Exam  Presentation General Appearance:  Appropriate for Environment  Eye Contact: Fleeting  Speech: Clear and Coherent  Speech Volume: Decreased  Handedness: Right   Mood and Affect  Mood: Irritable  Affect: Congruent   Thought Process  Thought Processes: Coherent; Disorganized  Descriptions of Associations:Intact  Orientation:Full (Time, Place and Person)  Thought Content:Delusions; Paranoid Ideation  Diagnosis of Schizophrenia or Schizoaffective disorder in past: Yes  Duration of Psychotic Symptoms: Greater than six months  Hallucinations:Hallucinations: None  Ideas of Reference:Paranoia; Delusions  Suicidal Thoughts:Suicidal Thoughts: No  Homicidal Thoughts:Homicidal Thoughts: No   Sensorium  Memory: Immediate Fair; Recent Fair; Remote Fair  Judgment: Intact  Insight: Present   Executive Functions  Concentration: Fair  Attention Span: Fair  Recall: [employer] of Knowledge: Fair  Language: Fair   Psychomotor Activity  Psychomotor Activity: Psychomotor Activity: Restlessness   Assets  Assets: Communication Skills; Desire for Improvement; Housing;  Leisure Time; Physical Health; Social Support   Sleep  Sleep: Sleep: Poor Number of Hours of Sleep: 5   Nutritional Assessment (For OBS and FBC admissions only) Has the patient had a weight loss or gain of 10 pounds or more in the last 3 months?: No Has the patient had a decrease in food intake/or appetite?: No Does the patient have dental problems?: No Does the patient have eating habits or behaviors that may be indicators of an eating disorder including binging or inducing vomiting?: No Has the patient recently lost weight without trying?: 0 Has the patient been eating poorly because of a decreased appetite?: 0 Malnutrition Screening Tool Score: 0    Physical Exam HENT:     Head: Normocephalic.     Nose: Nose normal.  Eyes:     Conjunctiva/sclera: Conjunctivae normal.  Cardiovascular:     Rate and Rhythm: Normal rate.  Pulmonary:     Effort: Pulmonary effort is normal.  Musculoskeletal:        General: Normal range of motion.     Cervical back: Normal range of motion.  Skin:    Comments: Old self inflicted cuts to left forearm.   Neurological:     Mental Status: He is alert and oriented to person, place, and time.    Review of Systems  Constitutional: Negative.   HENT: Negative.    Eyes: Negative.   Respiratory: Negative.    Cardiovascular: Negative.   Gastrointestinal: Negative.   Genitourinary: Negative.   Musculoskeletal: Negative.  Neurological: Negative.   Endo/Heme/Allergies: Negative.     Blood pressure 118/81, pulse 73, temperature 98.1 F (36.7 C), temperature source Oral, resp. rate 18, SpO2 95%. There is no height or weight on file to calculate BMI.  Past Psychiatric History: History of schizoaffective disorder, MDD, substance-induced psychosis, polysubstance abuse, cannabis use, and past intentional overdose in a suicide attempts. Per patient's mother, patient was last hospitalized at [facility] around springtime 2024. Patient last  hospitalized at [facility] behavioral health hospital from 05/20/2022 to 05/24/2022.  Is the patient at risk to self? No  Has the patient been a risk to self in the past 6 months? No .    Has the patient been a risk to self within the distant past? Yes   Is the patient a risk to others? No   Has the patient been a risk to others in the past 6 months? No   Has the patient been a risk to others within the distant past? No   Past Medical History: No reported medical history. Patient denies taking prescribed or OTC medications.   Family History: No known family history reported.   Social History: Patient resides in an apartment with a roommate. Patient is unemployed. Patient denies using illicit drugs or drink use,    Last Labs:  Admission on 02/13/2023  Component Date Value Ref Range Status   POC Amphetamine UR 02/13/2023 None Detected  NONE DETECTED (Cut Off Level 1000 ng/mL) Final   POC Secobarbital (BAR) 02/13/2023 None Detected  NONE DETECTED (Cut Off Level 300 ng/mL) Final   POC Buprenorphine (BUP) 02/13/2023 None Detected  NONE DETECTED (Cut Off Level 10 ng/mL) Final   POC Oxazepam (BZO) 02/13/2023 None Detected  NONE DETECTED (Cut Off Level 300 ng/mL) Final   POC Cocaine UR 02/13/2023 None Detected  NONE DETECTED (Cut Off Level 300 ng/mL) Final   POC Methamphetamine UR 02/13/2023 None Detected  NONE DETECTED (Cut Off Level 1000 ng/mL) Final   POC Morphine 02/13/2023 None Detected  NONE DETECTED (Cut Off Level 300 ng/mL) Final   POC Methadone UR 02/13/2023 None Detected  NONE DETECTED (Cut Off Level 300 ng/mL) Final   POC Oxycodone UR 02/13/2023 None Detected  NONE DETECTED (Cut Off Level 100 ng/mL) Final   POC Marijuana UR 02/13/2023 None Detected  NONE DETECTED (Cut Off Level 50 ng/mL) Final    Allergies: Penicillins, Sulfa antibiotics, and Abilify [aripiprazole]  Medications:  Facility Ordered Medications  Medication   acetaminophen (TYLENOL) tablet 650 mg   alum & mag  hydroxide-simeth (MAALOX/MYLANTA) 200-200-20 MG/5ML suspension 30 mL   magnesium hydroxide (MILK OF MAGNESIA) suspension 30 mL   hydrOXYzine (ATARAX) tablet 25 mg   traZODone (DESYREL) tablet 50 mg   risperiDONE (RISPERDAL M-TABS) disintegrating tablet 0.5 mg   benztropine (COGENTIN) tablet 0.5 mg   PTA Medications  Medication Sig   FLUoxetine (PROZAC) 20 MG capsule Take 1 capsule (20 mg total) by mouth daily.   haloperidol (HALDOL) 10 MG tablet Take 1 tablet (10 mg total) by mouth 2 (two) times daily.   nicotine (NICODERM CQ - DOSED IN MG/24 HOURS) 14 mg/24hr patch Place 1 patch (14 mg total) onto the skin daily.   traZODone (DESYREL) 50 MG tablet Take 1 tablet (50 mg total) by mouth at bedtime as needed for sleep.   benztropine (COGENTIN) 0.5 MG tablet Take 1 tablet (0.5 mg total) by mouth 2 (two) times daily.   gabapentin (NEURONTIN) 100 MG capsule Take 1 capsule (100 mg total)  by mouth 3 (three) times daily.      Medical Decision Making  Plan of care. Discussed recommending the patient for inpatient psychiatric treatment for medication management and mood stabilization. I discussed the risks and benefits of initiating Risperdal for psychosis. I discussed adding benztropine to prevent EPS/side effects from the Risperdal. Patient states that he does not want to take Cogentin. I discussed adding Cogentin as needed as needed for EPS. Patient verbalizes understanding. Patient verbally consents for inpatient psychiatric treatment. Patient is voluntary.   Medication regimen Initiate Risperdal 0.5 mg po at bedtime for psychosis/tolerability. Consider titrating up to therapeutic dose.  Add congentin 0.5 mg po BID PRN for tremor/EPS Add vistaril 25 mg po TID PRN for anxiety Add trazodone 50 mg po at bedtime prn for sleep.   Lab Orders         CBC with Differential/Platelet         Comprehensive metabolic panel         Ethanol         Lipid panel         TSH         Hemoglobin A1c          POCT Urine Drug Screen - (I-Screen)    EKG    Recommendations  Based on my evaluation the patient does not appear to have an emergency medical condition.  Layla Barter, NP 02/13/23  5:34 PM

## 2023-02-13 NOTE — Progress Notes (Addendum)
Pt was accepted to [facility] TODAY 02/13/2023; Bed Assignment To be determined by [facility] and [facility] will coordinate with care team PENDING signed voluntary consent updated to chart.  Pt meets inpatient criteria per Loreen Freud  Attending Physician will be Dr. Phineas Inches, MD   Report can be called to: -Adult unit: [phone]  Pt can arrive after: Pt is under review for acceptance at [facility].  Care Team notified:[facility] Lona Kettle, Night [facility] [place], Day [facility] Antoinette Cathren Harsh [place], Lillia Abed [place],LCSW   [place], [place] 02/13/2023 @ 10:11 PM

## 2023-02-14 ENCOUNTER — Encounter (HOSPITAL_COMMUNITY): Payer: Self-pay | Admitting: Behavioral Health

## 2023-02-14 ENCOUNTER — Inpatient Hospital Stay (HOSPITAL_COMMUNITY)
Admission: AD | Admit: 2023-02-14 | Discharge: 2023-02-17 | DRG: 885 | Disposition: A | Discharge status: Home / Self Care | Payer: MEDICAID | Source: Intra-hospital | Attending: Psychiatry | Admitting: Psychiatry

## 2023-02-14 ENCOUNTER — Other Ambulatory Visit: Payer: Self-pay

## 2023-02-14 DIAGNOSIS — Z79899 Other long term (current) drug therapy: Secondary | ICD-10-CM | POA: Diagnosis not present

## 2023-02-14 DIAGNOSIS — T434X6A Underdosing of butyrophenone and thiothixene neuroleptics, initial encounter: Secondary | ICD-10-CM | POA: Diagnosis present

## 2023-02-14 DIAGNOSIS — T43226A Underdosing of selective serotonin reuptake inhibitors, initial encounter: Secondary | ICD-10-CM | POA: Diagnosis present

## 2023-02-14 DIAGNOSIS — Z882 Allergy status to sulfonamides status: Secondary | ICD-10-CM

## 2023-02-14 DIAGNOSIS — F419 Anxiety disorder, unspecified: Secondary | ICD-10-CM | POA: Diagnosis present

## 2023-02-14 DIAGNOSIS — Z9151 Personal history of suicidal behavior: Secondary | ICD-10-CM

## 2023-02-14 DIAGNOSIS — G4700 Insomnia, unspecified: Secondary | ICD-10-CM | POA: Diagnosis present

## 2023-02-14 DIAGNOSIS — Z87892 Personal history of anaphylaxis: Secondary | ICD-10-CM | POA: Diagnosis not present

## 2023-02-14 DIAGNOSIS — Z888 Allergy status to other drugs, medicaments and biological substances status: Secondary | ICD-10-CM

## 2023-02-14 DIAGNOSIS — F17210 Nicotine dependence, cigarettes, uncomplicated: Secondary | ICD-10-CM | POA: Diagnosis present

## 2023-02-14 DIAGNOSIS — F251 Schizoaffective disorder, depressive type: Principal | ICD-10-CM | POA: Diagnosis present

## 2023-02-14 DIAGNOSIS — F1910 Other psychoactive substance abuse, uncomplicated: Secondary | ICD-10-CM | POA: Diagnosis present

## 2023-02-14 DIAGNOSIS — T443X6A Underdosing of other parasympatholytics [anticholinergics and antimuscarinics] and spasmolytics, initial encounter: Secondary | ICD-10-CM | POA: Diagnosis present

## 2023-02-14 DIAGNOSIS — Z592 Discord with neighbors, lodgers and landlord: Secondary | ICD-10-CM | POA: Diagnosis not present

## 2023-02-14 DIAGNOSIS — F259 Schizoaffective disorder, unspecified: Principal | ICD-10-CM | POA: Diagnosis present

## 2023-02-14 DIAGNOSIS — F4310 Post-traumatic stress disorder, unspecified: Secondary | ICD-10-CM | POA: Diagnosis present

## 2023-02-14 DIAGNOSIS — Z91128 Patient's intentional underdosing of medication regimen for other reason: Secondary | ICD-10-CM | POA: Diagnosis not present

## 2023-02-14 DIAGNOSIS — Z880 Allergy status to penicillin: Secondary | ICD-10-CM | POA: Diagnosis not present

## 2023-02-14 DIAGNOSIS — Z9152 Personal history of nonsuicidal self-harm: Secondary | ICD-10-CM | POA: Diagnosis not present

## 2023-02-14 LAB — HEMOGLOBIN A1C
Hgb A1c MFr Bld: 5.3 % (ref 4.8–5.6)
Mean Plasma Glucose: 105 mg/dL

## 2023-02-14 MED ORDER — MAGNESIUM HYDROXIDE 400 MG/5ML PO SUSP: 30.0000 mL | Freq: Every day | ORAL | Status: DC | PRN

## 2023-02-14 MED ORDER — BENZTROPINE MESYLATE 0.5 MG PO TABS: 0.5000 mg | ORAL_TABLET | Freq: Two times a day (BID) | ORAL | Status: DC | PRN

## 2023-02-14 MED ORDER — LORAZEPAM 2 MG/ML IJ SOLN: 2.0000 mg | Freq: Three times a day (TID) | INTRAMUSCULAR | Status: DC | PRN

## 2023-02-14 MED ORDER — RISPERIDONE 0.5 MG PO TBDP: 0.5000 mg | ORAL_TABLET | Freq: Every day | ORAL | Status: DC

## 2023-02-14 MED ORDER — TRAZODONE HCL 50 MG PO TABS
50.0000 mg | ORAL_TABLET | Freq: Every evening | ORAL | Status: DC | PRN
  Administered 2023-02-14 – 2023-02-16 (×3): 50 mg via ORAL
  Filled 2023-02-14 (×3): qty 1

## 2023-02-14 MED ORDER — DIPHENHYDRAMINE HCL 50 MG/ML IJ SOLN: 50.0000 mg | Freq: Three times a day (TID) | INTRAMUSCULAR | Status: DC | PRN

## 2023-02-14 MED ORDER — HALOPERIDOL LACTATE 5 MG/ML IJ SOLN: 5.0000 mg | Freq: Three times a day (TID) | INTRAMUSCULAR | Status: DC | PRN

## 2023-02-14 MED ORDER — HALOPERIDOL 5 MG PO TABS: 5.0000 mg | ORAL_TABLET | Freq: Three times a day (TID) | ORAL | Status: DC | PRN

## 2023-02-14 MED ORDER — ALUM & MAG HYDROXIDE-SIMETH 200-200-20 MG/5ML PO SUSP: 30.0000 mL | ORAL | Status: DC | PRN

## 2023-02-14 MED ORDER — RISPERIDONE 0.5 MG PO TBDP
0.5000 mg | ORAL_TABLET | Freq: Every day | ORAL | Status: DC
  Administered 2023-02-14: 0.5 mg via ORAL
  Filled 2023-02-14 (×2): qty 1

## 2023-02-14 MED ORDER — LORAZEPAM 1 MG PO TABS: 2.0000 mg | ORAL_TABLET | Freq: Three times a day (TID) | ORAL | Status: DC | PRN

## 2023-02-14 MED ORDER — ACETAMINOPHEN 325 MG PO TABS: 650.0000 mg | ORAL_TABLET | Freq: Four times a day (QID) | ORAL | Status: DC | PRN

## 2023-02-14 MED ORDER — DIPHENHYDRAMINE HCL 25 MG PO CAPS: 50.0000 mg | ORAL_CAPSULE | Freq: Three times a day (TID) | ORAL | Status: DC | PRN

## 2023-02-14 MED ORDER — HYDROXYZINE HCL 25 MG PO TABS: 25.0000 mg | ORAL_TABLET | Freq: Three times a day (TID) | ORAL | Status: DC | PRN

## 2023-02-14 NOTE — Progress Notes (Signed)
Pt admitted today from [facility]. Pt reports feeling anxious and paranoid. Pt states people are stealing from him. Pt reports he has not been sleeping and has been not taking his medications for about 6 months. Pt reports having financial as well as family stressors. Pt denies SI/HI/AVH. Pt denies alcohol or substance use.

## 2023-02-14 NOTE — Group Note (Signed)
Date:  02/14/2023 Time:  8:47 PM  Group Topic/Focus:  Wrap-Up Group:   The focus of this group is to help patients review their daily goal of treatment and discuss progress on daily workbooks.    Participation Level:  Did Not Attend   Scot Dock 02/14/2023, 8:47 PM

## 2023-02-14 NOTE — Progress Notes (Signed)
Pt agreeable to nursing assessment though pt sitting at his window. Pt denies SI, HI, A/ V H depression and anxiety. Pt reports he is here for feeling uncontrollable rage that has been increasing over past several weeks . Pt agreeable. Pt came to window at med time but does not feel he could be in milieu with others at this time.

## 2023-02-14 NOTE — ED Notes (Signed)
Patient is sleeping. Respirations equal and unlabored, skin warm and dry. No change in assessment or acuity. Routine safety checks conducted according to facility protocol. Will continue to monitor for safety.   

## 2023-02-14 NOTE — ED Notes (Signed)
Report called to Nurse, children's at [facility]. Patient in route to [facility] with safe transport, he had no belongings, his mother had taken them all home. Denies SI/HI/AVH on discharge, voluntary consent form and copy of EKG sent with patient.

## 2023-02-14 NOTE — Discharge Instructions (Signed)
Transfer to [facility] for inpatient psychiatric admission

## 2023-02-14 NOTE — ED Provider Notes (Signed)
FBC/OBS ASAP Discharge Summary  Date and Time: 02/14/2023 11:41 AM  Name: Billy Rose  MRN:  [id number]   Discharge Diagnoses:  Final diagnoses:  Schizoaffective disorder, unspecified type (HCC)   HPI: Billy Rose is a 22 year old male patient with a past psychiatric history significant for schizoaffective disorder, MDD, substance-induced psychosis, polysubstance abuse, cannabis use, and past intentional overdose in a suicide attempts who presents to the [facility] behavioral health urgent care voluntary accompanied by his mother Delvonta Rootes [phone] with c/o delusions and insomnia.  Patient was admitted to the continuous assessment unit while awaiting inpatient psychiatric bed availability  Patient seen face-to-face by this provider and chart reviewed on 02/01/2023  Subjective:   On today's assessment patient is observed laying in his bed awake.  He is alert/oriented x 4, cooperative, and fairly attentive.  His speech is clear, coherent, at a normal rate and tone.  He makes fair eye contact.  He is somewhat guarded with questions.  He continues to be delusional and paranoid as he talks about his neighbor stealing his food.  States, "I have never seen them but I know that they come in and take it because it gets gone".  He continues to endorse anxiety.  He has a dysphoric affect.  He reports compliance to medications even though it is documented that his mother states he was not taking his medicines.  He denies suicidal/homicidal ideations.  He does not appear to be responding to internal/external stimuli.  Discussed inpatient psychiatric admission and he is in agreement.  He will remain voluntary at this time  Stay Summary:   Patient is recommended for inpatient psychiatric admission.  [facility] notified and patient has been accepted.  Total Time spent with patient: 30 minutes  Past Psychiatric History: see H&P Past Medical History: see H&P Family History: see H&P Family  Psychiatric History: see H&P Social History: see H&P Tobacco Cessation:  N/A, patient does not currently use tobacco products  Current Medications:  Current Facility-Administered Medications  Medication Dose Route Frequency Provider Last Rate Last Admin   acetaminophen (TYLENOL) tablet 650 mg  650 mg Oral Q6H PRN White, Patrice L, NP       alum & mag hydroxide-simeth (MAALOX/MYLANTA) 200-200-20 MG/5ML suspension 30 mL  30 mL Oral Q4H PRN White, Patrice L, NP       benztropine (COGENTIN) tablet 0.5 mg  0.5 mg Oral BID PRN White, Patrice L, NP       hydrOXYzine (ATARAX) tablet 25 mg  25 mg Oral TID PRN White, Patrice L, NP   25 mg at 02/13/23 2107   magnesium hydroxide (MILK OF MAGNESIA) suspension 30 mL  30 mL Oral Daily PRN White, Patrice L, NP       risperiDONE (RISPERDAL M-TABS) disintegrating tablet 0.5 mg  0.5 mg Oral QHS White, Patrice L, NP   0.5 mg at 02/13/23 2107   traZODone (DESYREL) tablet 50 mg  50 mg Oral QHS PRN White, Patrice L, NP   50 mg at 02/13/23 2107   Current Outpatient Medications  Medication Sig Dispense Refill   benztropine (COGENTIN) 0.5 MG tablet Take 1 tablet (0.5 mg total) by mouth 2 (two) times daily. 60 tablet 0   risperiDONE (RISPERDAL M-TABS) 0.5 MG disintegrating tablet Take 1 tablet (0.5 mg total) by mouth at bedtime.      PTA Medications:  PTA Medications  Medication Sig   risperiDONE (RISPERDAL M-TABS) 0.5 MG disintegrating tablet Take 1 tablet (0.5 mg total) by  mouth at bedtime.   Facility Ordered Medications  Medication   acetaminophen (TYLENOL) tablet 650 mg   alum & mag hydroxide-simeth (MAALOX/MYLANTA) 200-200-20 MG/5ML suspension 30 mL   magnesium hydroxide (MILK OF MAGNESIA) suspension 30 mL   hydrOXYzine (ATARAX) tablet 25 mg   traZODone (DESYREL) tablet 50 mg   risperiDONE (RISPERDAL M-TABS) disintegrating tablet 0.5 mg   benztropine (COGENTIN) tablet 0.5 mg        No data to display          Flowsheet Row ED from 02/13/2023 in  [facility]Dis[facility]) [facility]19/2022 in [facility]  C-SSRS RISK CATEGORY No Risk No Risk Moderate Risk       Musculoskeletal  Strength & Muscle Tone: within normal limits Gait & Station: normal Patient leans: N/A  Psychiatric Specialty Exam  Presentation  General Appearance:  Appropriate for Environment  Eye Contact: Fair  Speech: Clear and Coherent; Normal Rate  Speech Volume: Normal  Handedness: Right   Mood and Affect  Mood: Anxious; Depressed  Affect: Congruent   Thought Process  Thought Processes: Coherent  Descriptions of Associations:Intact  Orientation:Full (Time, Place and Person)  Thought Content:Delusions; Paranoid Ideation  Diagnosis of Schizophrenia or Schizoaffective disorder in past: Yes  Duration of Psychotic Symptoms: Greater than six months   Hallucinations:Hallucinations: None  Ideas of Reference:Paranoia  Suicidal Thoughts:Suicidal Thoughts: No  Homicidal Thoughts:Homicidal Thoughts: No   Sensorium  Memory: Immediate Fair; Recent Fair; Remote Fair  Judgment: Fair  Insight: Fair   Art therapist  Concentration: Fair  Attention Span: Fair  Recall: Fair  Fund of Knowledge: Fair  Language: Good   Psychomotor Activity  Psychomotor Activity: Psychomotor Activity: Normal   Assets  Assets: Housing; Physical Health; Resilience; Social Support; Financial Resources/Insurance   Sleep  Sleep: Sleep: Poor Number of Hours of Sleep: 5   Nutritional Assessment (For OBS and FBC admissions only) Has the patient had a weight loss or gain of 10 pounds or more in the last 3 months?: No Has the patient had a decrease in food intake/or appetite?: No Does the patient have dental problems?: No Does the patient have eating habits or behaviors that may be  indicators of an eating disorder including binging or inducing vomiting?: No Has the patient recently lost weight without trying?: 0 Has the patient been eating poorly because of a decreased appetite?: 0 Malnutrition Screening Tool Score: 0    Physical Exam  Physical Exam Vitals and nursing note reviewed.  Constitutional:      Appearance: Normal appearance.  HENT:     Head: Normocephalic.  Eyes:     General:        Right eye: No discharge.        Left eye: No discharge.  Cardiovascular:     Rate and Rhythm: Normal rate.  Pulmonary:     Effort: Pulmonary effort is normal. No respiratory distress.  Musculoskeletal:        General: Normal range of motion.     Cervical back: Normal range of motion.  Skin:    Coloration: Skin is not jaundiced or pale.  Neurological:     Mental Status: He is alert and oriented to person, place, and time.  Psychiatric:        Attention and Perception: Attention and perception normal.        Mood and Affect: Mood is anxious and  depressed.        Speech: Speech normal.        Behavior: Behavior normal. Behavior is cooperative.        Thought Content: Thought content is paranoid and delusional.        Cognition and Memory: Cognition is impaired.        Judgment: Judgment is impulsive.    Review of Systems  Constitutional: Negative.   HENT: Negative.    Eyes: Negative.   Respiratory: Negative.    Cardiovascular: Negative.   Genitourinary: Negative.   Musculoskeletal: Negative.   Skin: Negative.   Psychiatric/Behavioral:  Positive for depression. The patient is nervous/anxious.    Blood pressure 116/75, pulse 75, temperature 98.1 F (36.7 C), resp. rate 19, SpO2 99%. There is no height or weight on file to calculate BMI.   Disposition: Discharge patient and readmit to [facility] for inpatient psychiatric admission.  Admission orders placed  Ardis Hughs, NP 02/14/2023, 11:41 AM

## 2023-02-15 DIAGNOSIS — F259 Schizoaffective disorder, unspecified: Secondary | ICD-10-CM | POA: Diagnosis not present

## 2023-02-15 MED ORDER — RISPERIDONE 1 MG PO TBDP
1.0000 mg | ORAL_TABLET | Freq: Two times a day (BID) | ORAL | Status: DC
  Administered 2023-02-15 – 2023-02-17 (×5): 1 mg via ORAL
  Filled 2023-02-15 (×8): qty 1

## 2023-02-15 NOTE — Progress Notes (Signed)
D) Pt received calm, visible, participating in milieu, and in no acute distress. Pt A & O x4. Pt denies SI, HI, A/ V H, depression, anxiety and pain at this time. A) Pt encouraged to drink fluids. Pt encouraged to come to staff with needs. Pt encouraged to attend and participate in groups. Pt encouraged to set reachable goals.  R) Pt remained safe on unit, in no acute distress, will continue to assess.   Pt spent most of evening in his room    02/15/23 2000  Psych Admission Type (Psych Patients Only)  Admission Status Voluntary  Psychosocial Assessment  Patient Complaints Insomnia  Eye Contact Fair  Facial Expression Animated  Affect Anxious  Speech Logical/coherent  Interaction Assertive  Motor Activity Restless  Appearance/Hygiene Unremarkable  Behavior Characteristics Cooperative  Mood Anxious  Thought Process  Coherency Circumstantial  Content Delusions  Delusions Paranoid  Perception WDL  Hallucination None reported or observed  Judgment Limited  Confusion None  Danger to Self  Current suicidal ideation? Denies  Agreement Not to Harm Self Yes  Description of Agreement verbal  Danger to Others  Danger to Others None reported or observed

## 2023-02-15 NOTE — Progress Notes (Signed)
   02/15/23 1600  Psych Admission Type (Psych Patients Only)  Admission Status Voluntary  Psychosocial Assessment  Patient Complaints Anxiety  Eye Contact Fair  Facial Expression Animated  Affect Anxious  Speech Logical/coherent  Interaction Assertive  Motor Activity Pacing  Appearance/Hygiene Unremarkable  Behavior Characteristics Cooperative  Mood Anxious  Thought Process  Coherency Circumstantial  Content Delusions  Delusions Paranoid  Perception WDL  Hallucination None reported or observed  Judgment Limited  Confusion None  Danger to Self  Current suicidal ideation? Denies  Agreement Not to Harm Self Yes  Danger to Others  Danger to Others None reported or observed   Pt mostly in room. Does walk up and down hallway at times. Pt requesting discharge, states he will work with providers tomorrow and discuss plans. Pt denies a/v/h as well as si/hi. Paranoia remains and pt has not showered.

## 2023-02-15 NOTE — H&P (Signed)
Psychiatric Admission Assessment Adult  Patient Identification: Billy Rose MRN:  [id number] Date of Evaluation:  02/15/2023 Chief Complaint:  Schizoaffective disorder (HCC) [F25.9] Principal Diagnosis: Schizoaffective disorder (HCC) Diagnosis:  Principal Problem:   Schizoaffective disorder (HCC)  History of Present Illness:  Billy Rose is a 22 yr old male who presented on 9/13 to [facility] with complaints of delusions and insomnia, he was admitted to [facility] on 9/15.  PPHx is significant for Schizoaffective Disorder, Depression, PTSD, and a history of Polysubstance Abuse and Substance induced Psychosis, and 3 Suicide Attempts (OD- Prozac 2023), Self Injurious Behavior (Cutting- last 2023), and Multiple Psychiatric Hospitalizations (last- [facility] 09/2022).   When asked why he presented to the hospital he reports it is due to stress and anxiety.  He reports he is having issues with neighbors, relationship with his family, and relationship with God.  He reports that he stopped taking his medications after he was last discharged.  He reports that this was due to his legs shaking as a side effect from his medications.  He reports he has been having trouble sleeping as well.  Reports past psychiatric history significant for Schizoaffective Disorder, Depression, PTSD, and a history of Polysubstance Abuse and Substance induced Psychosis.  He reports 3 Suicide Attempts- last OD Prozac 2023.  He reports a history of Self Injurious Behavior: Cutting- last 2023.  He reports a history of multiple psychiatric hospitalizations- last [facility] 09/2022.  He reports no significant past medical history.  He reports no significant past surgical history.  He reports no history of head trauma.  He reports no history of seizures.  He reports NKDA.  He reports he currently lives in an apartment with a roommate.  He reports he is not currently employed.  He reports he graduated high school.  He reports no alcohol use.  He  reports smoking less than 1/4 pack per day of cigarettes.  He reports no substance use.  He reports no current legal issues.  He reports no access to firearms.  When asked about restarting medications he reports that he was started on Risperdal at the [facility].  He reports that he feels like this has been helpful so far and has been helping keep his mood stable but that he feels like it is not quite strong enough.  Discussed we could further increase it and he was agreeable with this.  Asked if he would be interested in starting an antidepressant to address his depression and anxiety and he declines at this time.  He reports no other concerns at present.  Attempt to call patient's mother, Billy Rose, [phone].  However, there was no answer.  Will attempt to contact later.  Per Billy Nixon NP [facility] H&P 9/13: "Patient states that he has been under a lot of stress and having problems with sleeping because his neighbors are "very loud and people stealing." When asked to further elaborate, he states that they are stealing his food and unscrewing his stuff in the house. He states that he lives alone in an apartment. His mother interjects and states that he has a roommate. He states that he has been sleeping about 4 to 5 hours per night for the past 4 months. He denies taking prescribed medications and states that he did not like the way the medications were making him feel. He states that he stopped taking his medications 1 year ago. His mom states that he was previously prescribed Haldol, Cogentin, and Prozac when he left the hospital  but stopped taking his medications around April 2024. The patient states that the Haldol was making his legs shake uncontrollably. He states that he does not need to take medications because he is not psychotic. He states that he has anxiety and he needs someone to prescribe him medication for anxiety. He asked if he could be given ativan. Patient was offered hydroxyzine for  anxiety, however, he declined. I explained to the patient that the hydroxyzine will be ordered as needed for anxiety. Patient denies suicidal ideations. He denies past suicide attempts. However, he does have a significant documented history of intentional overdoses on medication in suicide attempts. He reports self injurious behaviors but denies recent self-harm. He reports a history of cutting and shows this provider self inflicted cuts to his left forearm that appear to be old in nature. He states that he has not self harmed in 4 months. He denies homicidal ideations. He denies auditory or visual hallucinations. His mother interjects and tells him to be honest. She states that for the past 4 weeks he has been making lots of gestures, laughing at things out of the blue, talking to people who are not there, saying that he is talking to the voice of God. She states that he has also worn the same outfit everyday for the past 4 weeks without laundering it. She states that his behaviors have been going on for months. The patient does not deny his mother's complaints.  Patient denies drinking alcohol or using illicit drugs. The patient's mother also states that the patient was following up with [facility] when he was discharged from [facility] around spring time and decided to stop taking his medications at that time. Patient is currently not established with outpatient psychiatry for medication management or therapy.  She states that in the past the patient was prescribed lithium, Prozac, Zyprexa,  and that she is unsure about Risperdal. Patient states that in the past he was prescribed Ativan. Patient denies medical history."   Associated Signs/Symptoms: Depression Symptoms:  depressed mood, anhedonia, insomnia, fatigue, feelings of worthlessness/guilt, hopelessness, anxiety, loss of energy/fatigue, disturbed sleep, (Hypo) Manic Symptoms:   Reports None Anxiety Symptoms:  Excessive Worry, Psychotic  Symptoms:   Reports None PTSD Symptoms: Re-experiencing:  Intrusive Thoughts Hypervigilance:  Yes Hyperarousal:  Increased Startle Response Avoidance:  None Total Time spent with patient: 45 minutes  Past Psychiatric History: Schizoaffective Disorder, Depression, PTSD, and a history of Polysubstance Abuse and Substance induced Psychosis, and 3 Suicide Attempts (OD- Prozac 2023), Self Injurious Behavior (Cutting- last 2023), and Multiple Psychiatric Hospitalizations (last- [facility] 09/2022).   Is the patient at risk to self? No.  Has the patient been a risk to self in the past 6 months? No.  Has the patient been a risk to self within the distant past? Yes.    Is the patient a risk to others? No.  Has the patient been a risk to others in the past 6 months? No.  Has the patient been a risk to others within the distant past? No.   [place] Scale:  Flowsheet Row [facility]) [facility]13/20[facility]Dis[facility]  C-SSRS RISK CATEGORY No Risk No Risk No Risk        Prior Inpatient Therapy: Yes.   If yes, describe [name] 09/2022  Prior Outpatient Therapy: No. If yes, describe N/A  Alcohol Screening: Patient refused Alcohol Screening Tool: Yes 1. How often do you have a drink containing alcohol?: Never 2. How many drinks containing alcohol do you have on a typical day when you are drinking?: 1 or 2 3. How often do you have six or more drinks on one occasion?: Never AUDIT-C Score: 0 4. How often during the last year have you found that you were not able to stop drinking once you had started?: Never 5. How often during the last year have you failed to do what was normally expected from you because of drinking?: Never 6. How often during the last year have you needed a first drink in the morning to get  yourself going after a heavy drinking session?: Never 7. How often during the last year have you had a feeling of guilt of remorse after drinking?: Never 8. How often during the last year have you been unable to remember what happened the night before because you had been drinking?: Never 9. Have you or someone else been injured as a result of your drinking?: No 10. Has a relative or friend or a doctor or another health worker been concerned about your drinking or suggested you cut down?: No Alcohol Use Disorder Identification Test Final Score (AUDIT): 0 Alcohol Brief Interventions/Follow-up: Alcohol education/Brief advice Substance Abuse History in the last 12 months:  Yes.   Consequences of Substance Abuse: NA Previous Psychotropic Medications: Yes  Haldol, Cogentin, Prozac, Trazodone, Zyprexa, Abilify, Lithium, Lamictal, and Latuda. 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 No pertinent surgical history. Family History:  Family History  Problem Relation Age of Onset   Allergic rhinitis Neg Hx    Asthma Neg Hx    Eczema Neg Hx    Urticaria Neg Hx    Family Psychiatric  History:  Father- Bipolar Disorder No Known Substance Abuse or Suicides  Tobacco Screening:  Social History   Tobacco Use  Smoking Status Every Day   Current packs/day: 1.00   Types: Cigarettes  Smokeless Tobacco Never    BH Tobacco Counseling     Are you interested in Tobacco Cessation Medications?  Yes, implement Nicotene Replacement Protocol Counseled patient on smoking cessation:  Yes Reason Tobacco Screening Not Completed: No value filed.       Social History:  Social History   Substance and Sexual Activity  Alcohol Use Yes   Comment: "4 beers every  so often"     Social History   Substance and Sexual Activity  Drug Use Not Currently   Types: Cocaine, LSD, Marijuana, IV    Additional Social History: Marital status: Single Does patient have children?: No                         Allergies:   Allergies  Allergen Reactions   Penicillins Anaphylaxis, Swelling and Other (See Comments)    Did it involve swelling of the face/tongue/throat, SOB, or low BP? Yes Did it involve sudden or severe rash/hives, skin peeling, or any reaction on the inside of your mouth or nose? Unk Did you need to seek medical attention at a hospital or doctor's office? Unk When did it last happen? Childhood If all above answers are "NO",  may proceed with cephalosporin use.   Sulfa Antibiotics Anaphylaxis, Shortness Of Breath, Swelling, Rash and Other (See Comments)    Throat and face swell   Abilify [Aripiprazole] Other (See Comments)    Worsened the patient   Lab Results:  Results for orders placed or performed during the hospital encounter of 02/13/23 (from the past 48 hour(s))  CBC with Differential/Platelet     Status: None   Collection Time: 02/13/23  5:11 PM  Result Value Ref Range   WBC 6.4 4.0 - 10.5 K/uL   RBC 5.12 4.22 - 5.81 MIL/uL   Hemoglobin 15.0 13.0 - 17.0 g/dL   HCT 16.1 09.6 - 04.5 %   MCV 87.5 80.0 - 100.0 fL   MCH 29.3 26.0 - 34.0 pg   MCHC 33.5 30.0 - 36.0 g/dL   RDW 40.9 81.1 - 91.4 %   Platelets 238 150 - 400 K/uL   nRBC 0.0 0.0 - 0.2 %   Neutrophils Relative % 52 %   Neutro Abs 3.4 1.7 - 7.7 K/uL   Lymphocytes Relative 35 %   Lymphs Abs 2.3 0.7 - 4.0 K/uL   Monocytes Relative 7 %   Monocytes Absolute 0.5 0.1 - 1.0 K/uL   Eosinophils Relative 4 %   Eosinophils Absolute 0.3 0.0 - 0.5 K/uL   Basophils Relative 1 %   Basophils Absolute 0.0 0.0 - 0.1 K/uL   Immature Granulocytes 1 %   Abs Immature Granulocytes 0.03 0.00 - 0.07 K/uL    Comment: Performed at [facility], [address], [place], [place]  [address]  Comprehensive metabolic panel     Status: Abnormal   Collection Time: 02/13/23  5:11 PM  Result Value Ref Range   Sodium 141 135 - 145 mmol/L   Potassium 4.2 3.5 - 5.1 mmol/L   Chloride 98 98 - 111 mmol/L   CO2 25 22 - 32 mmol/L   Glucose, Bld 89 70 - 99 mg/dL    Comment: Glucose reference range applies only to samples taken after fasting for at least 8 hours.   BUN 15 6 - 20 mg/dL   Creatinine, Ser 6.21 0.61 - 1.24 mg/dL   Calcium 9.9 8.9 - 30.8 mg/dL   Total Protein 7.3 6.5 - 8.1 g/dL   Albumin 4.5 3.5 - 5.0 g/dL   AST 18 15 - 41 U/L   ALT 17 0 - 44 U/L   Alkaline Phosphatase 66 38 - 126 U/L   Total Bilirubin 0.8 0.3 - 1.2 mg/dL   GFR, Estimated >65 >78 mL/min    Comment: (NOTE) Calculated using the CKD-EPI Creatinine Equation (2021)    Anion gap 18 (H) 5 - 15    Comment: Performed at [facility], [address] [place], [place] [address]  Ethanol     Status: None   Collection Time: 02/13/23  5:11 PM  Result Value Ref Range   Alcohol, Ethyl (B) <10 <10 mg/dL    Comment: (NOTE) Lowest detectable limit for serum alcohol is 10 mg/dL.  For medical purposes only. Performed at [facility], [address] [place], [place] [address]   Lipid panel     Status: None   Collection Time: 02/13/23  5:11 PM  Result Value Ref Range   Cholesterol 151 0 - 200 mg/dL   Triglycerides 132 <440 mg/dL   HDL 56 >10 mg/dL   Total CHOL/HDL Ratio 2.7 RATIO   VLDL 20 0 - 40 mg/dL  LDL Cholesterol 75 0 - 99 mg/dL    Comment:        Total Cholesterol/HDL:CHD Risk Coronary Heart Disease Risk Table                     Men   Women  1/2 Average Risk   3.4   3.3  Average Risk       5.0   4.4  2 X Average Risk   9.6   7.1  3 X Average Risk  23.4   11.0        Use the calculated Patient Ratio above and the CHD Risk Table to determine the patient's CHD Risk.        ATP III CLASSIFICATION (LDL):  <100     mg/dL   Optimal  160-109  mg/dL   Near or Above                     Optimal  130-159  mg/dL   Borderline  323-557  mg/dL   High  >322     mg/dL   Very High Performed at [facility], [address], [place], [place] [address]   TSH     Status: None   Collection Time: 02/13/23  5:11 PM  Result Value Ref Range   TSH 0.840 0.350 - 4.500 uIU/mL    Comment: Performed by a 3rd Generation assay with a functional sensitivity of <=0.01 uIU/mL. Performed at [facility], [address] [place], [place] [address]   Hemoglobin A1c     Status: None   Collection Time: 02/13/23  5:11 PM  Result Value Ref Range   Hgb A1c MFr Bld 5.3 4.8 - 5.6 %    Comment: (NOTE)         Prediabetes: 5.7 - 6.4         Diabetes: >6.4         Glycemic control for adults with diabetes: <7.0    Mean Plasma Glucose 105 mg/dL    Comment: (NOTE) Performed At: [facility] [address] [place], [place] [id number] Jolene Schimke MD OH:[id number]   POCT Urine Drug Screen - (I-Screen)     Status: Normal   Collection Time: 02/13/23  5:12 PM  Result Value Ref Range   POC Amphetamine UR None Detected NONE DETECTED (Cut Off Level 1000 ng/mL)   POC Secobarbital (BAR) None Detected NONE DETECTED (Cut Off Level 300 ng/mL)   POC Buprenorphine (BUP) None Detected NONE DETECTED (Cut Off Level 10 ng/mL)   POC Oxazepam (BZO) None Detected NONE DETECTED (Cut Off Level 300 ng/mL)   POC Cocaine UR None Detected NONE DETECTED (Cut Off Level 300 ng/mL)   POC Methamphetamine UR None Detected NONE DETECTED (Cut Off Level 1000 ng/mL)   POC Morphine None Detected NONE DETECTED (Cut Off Level 300 ng/mL)   POC Methadone UR None Detected NONE DETECTED (Cut Off Level 300 ng/mL)   POC Oxycodone UR None Detected NONE DETECTED (Cut Off Level 100 ng/mL)   POC Marijuana UR None Detected NONE DETECTED (Cut Off Level 50 ng/mL)    Blood Alcohol level:  Lab Results  Component Value Date   ETH <10 02/13/2023   ETH <10 05/19/2022    Metabolic Disorder Labs:  Lab Results  Component Value  Date   HGBA1C 5.3 02/13/2023   MPG 105 02/13/2023   MPG 99.67 02/07/2022   Lab Results  Component Value Date   PROLACTIN  24.1 (H) 05/22/2020   PROLACTIN 32.1 (H) 02/15/2017   Lab Results  Component Value Date   CHOL 151 02/13/2023   TRIG 100 02/13/2023   HDL 56 02/13/2023   CHOLHDL 2.7 02/13/2023   VLDL 20 02/13/2023   LDLCALC 75 02/13/2023   LDLCALC 61 02/07/2022    Current Medications: Current Facility-Administered Medications  Medication Dose Route Frequency Provider Last Rate Last Admin   acetaminophen (TYLENOL) tablet 650 mg  650 mg Oral Q6H PRN Ajibola, Ene A, NP       alum & mag hydroxide-simeth (MAALOX/MYLANTA) 200-200-20 MG/5ML suspension 30 mL  30 mL Oral Q4H PRN Ajibola, Ene A, NP       benztropine (COGENTIN) tablet 0.5 mg  0.5 mg Oral BID PRN Ardis Hughs, NP       diphenhydrAMINE (BENADRYL) capsule 50 mg  50 mg Oral TID PRN Ajibola, Ene A, NP       Or   diphenhydrAMINE (BENADRYL) injection 50 mg  50 mg Intramuscular TID PRN Ajibola, Ene A, NP       haloperidol (HALDOL) tablet 5 mg  5 mg Oral TID PRN Ajibola, Ene A, NP       Or   haloperidol lactate (HALDOL) injection 5 mg  5 mg Intramuscular TID PRN Ajibola, Ene A, NP       hydrOXYzine (ATARAX) tablet 25 mg  25 mg Oral TID PRN Ajibola, Ene A, NP       LORazepam (ATIVAN) tablet 2 mg  2 mg Oral TID PRN Ajibola, Ene A, NP       Or   LORazepam (ATIVAN) injection 2 mg  2 mg Intramuscular TID PRN Ajibola, Ene A, NP       magnesium hydroxide (MILK OF MAGNESIA) suspension 30 mL  30 mL Oral Daily PRN Ajibola, Ene A, NP       risperiDONE (RISPERDAL M-TABS) disintegrating tablet 1 mg  1 mg Oral BID Lauro Franklin, MD   1 mg at 02/15/23 1039   traZODone (DESYREL) tablet 50 mg  50 mg Oral QHS PRN Ajibola, Ene A, NP   50 mg at 02/14/23 2112   PTA Medications: Medications Prior to Admission  Medication Sig Dispense Refill Last Dose   benztropine (COGENTIN) 0.5 MG tablet Take 1 tablet (0.5 mg total) by mouth 2  (two) times daily. 60 tablet 0    risperiDONE (RISPERDAL M-TABS) 0.5 MG disintegrating tablet Take 1 tablet (0.5 mg total) by mouth at bedtime.       Musculoskeletal: Strength & Muscle Tone: within normal limits Gait & Station: normal Patient leans: N/A            Psychiatric Specialty Exam:  Presentation  General Appearance:  Appropriate for Environment  Eye Contact: Poor  Speech: Clear and Coherent; Normal Rate  Speech Volume: Normal  Handedness: Right   Mood and Affect  Mood: Dysphoric; Anxious  Affect: Congruent   Thought Process  Thought Processes: Coherent; Goal Directed  Duration of Psychotic Symptoms: Greater than 6 months Past Diagnosis of Schizophrenia or Psychoactive disorder: Yes  Descriptions of Associations:Intact  Orientation:Full (Time, Place and Person)  Thought Content:WDL  Hallucinations:Hallucinations: None  Ideas of Reference:None  Suicidal Thoughts:Suicidal Thoughts: No  Homicidal Thoughts:Homicidal Thoughts: No   Sensorium  Memory: Immediate Fair; Recent Fair  Judgment: Fair  Insight: Fair   Art therapist  Concentration: Fair  Attention Span: Fair  Recall: Fair  Fund of Knowledge: Fair  Language: Good   Psychomotor Activity  Psychomotor  Activity: Psychomotor Activity: Normal   Assets  Assets: Housing; Resilience; Social Support; Financial Resources/Insurance   Sleep  Sleep: Sleep: Fair    Physical Exam: Physical Exam Vitals and nursing note reviewed.  Constitutional:      General: He is not in acute distress.    Appearance: Normal appearance. He is normal weight. He is not ill-appearing or toxic-appearing.  HENT:     Head: Normocephalic and atraumatic.  Pulmonary:     Effort: Pulmonary effort is normal.  Musculoskeletal:        General: Normal range of motion.  Neurological:     General: No focal deficit present.     Mental Status: He is alert.    Review of  Systems  Respiratory:  Negative for cough and shortness of breath.   Cardiovascular:  Negative for chest pain.  Gastrointestinal:  Negative for abdominal pain, constipation, diarrhea, nausea and vomiting.  Neurological:  Negative for dizziness, weakness and headaches.  Psychiatric/Behavioral:  Positive for depression. Negative for hallucinations and suicidal ideas. The patient is nervous/anxious.    Blood pressure 124/73, pulse (!) 125, temperature 98 F (36.7 C), temperature source Oral, resp. rate 16, height 5\' 8"  (1.727 m), weight 56.1 kg, SpO2 99%. Body mass index is 18.79 kg/m.  Treatment Plan Summary: Daily contact with patient to assess and evaluate symptoms and progress in treatment and Medication management  Billy Rose is a 22 yr old male who presented on 9/13 to [facility] with complaints of delusions and insomnia, he was admitted to [facility] on 9/15.  PPHx is significant for Schizoaffective Disorder, Depression, PTSD, and a history of Polysubstance Abuse and Substance induced Psychosis, and 3 Suicide Attempts (OD- Prozac 2023), Self Injurious Behavior (Cutting- last 2023), and Multiple Psychiatric Hospitalizations (last- [facility] 09/2022).    Billy Rose has tolerated starting Risperdal and seems to be improving.  We will increase it to further address his symptoms.  To address his depression and anxiety we did offer an antidepressant but he is declining it at this time.  We will continue to monitor.    Schizoaffective Disorder, Depressive Type: -Increase Risperdal M-tabs to 1 mg BID for psychosis and mood stability.  -Continue Cogentin 0.5 mg BID PRN drug induced EPS -Continue Agitation Protocol: Haldol/Ativan/Benadryl   -Continue PRN's: Tylenol, Maalox, Atarax, Milk of Magnesia, Trazodone   Observation Level/Precautions:  15 minute checks  Laboratory:  CMP: WNL except Anion Gap: 18,  CBC: WNL, Lipid Panel: WNL,  A1c: 5.3,  TSH: 0.840,  UDS: Neg,  EKG: NSR with Qtc: 402  Psychotherapy:     Medications:  Risperdal  Consultations:    Discharge Concerns:    Estimated LOS:   Other:     Physician Treatment Plan for Primary Diagnosis: Schizoaffective disorder (HCC) Long Term Goal(s): Improvement in symptoms so as ready for discharge  Short Term Goals: Ability to identify changes in lifestyle to reduce recurrence of condition will improve, Ability to verbalize feelings will improve, Ability to demonstrate self-control will improve, Ability to identify and develop effective coping behaviors will improve, Ability to maintain clinical measurements within normal limits will improve, Compliance with prescribed medications will improve, and Ability to identify triggers associated with substance abuse/mental health issues will improve  Physician Treatment Plan for Secondary Diagnosis: Principal Problem:   Schizoaffective disorder (HCC)  Long Term Goal(s): Improvement in symptoms so as ready for discharge  Short Term Goals: Ability to identify changes in lifestyle to reduce recurrence of condition will improve, Ability to verbalize feelings will  improve, Ability to demonstrate self-control will improve, Ability to identify and develop effective coping behaviors will improve, Ability to maintain clinical measurements within normal limits will improve, Compliance with prescribed medications will improve, and Ability to identify triggers associated with substance abuse/mental health issues will improve  I certify that inpatient services furnished can reasonably be expected to improve the patient's condition.    Lauro Franklin, MD 9/15/20242:59 PM

## 2023-02-15 NOTE — Group Note (Signed)
Date:  02/15/2023 Time:  8:58 PM  Group Topic/Focus:  Wrap-Up Group:   The focus of this group is to help patients review their daily goal of treatment and discuss progress on daily workbooks.    Participation Level:  Did Not Attend   Scot Dock 02/15/2023, 8:58 PM

## 2023-02-15 NOTE — [facility] Suicide Risk Assessment (Addendum)
[facility] INPATIENT:  Family/Significant Other Suicide Prevention Education  Suicide Prevention Education:  Contact Attempts: Billy Rose, (Mother [phone] has been identified by the patient as the family member/significant other with whom the patient will be residing, and identified as the person(s) who will aid the patient in the event of a mental health crisis.  With written consent from the patient, two attempts were made to provide suicide prevention education, prior to and/or following the patient's discharge.  We were unsuccessful in providing suicide prevention education.  A suicide education pamphlet was given to the patient to share with family/significant other.  Date and time of first attempt:02/15/23/12:46pm CSW left voicemail to call back at [phone]  Billy Rose 02/15/2023, 12:47 PM
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
## 2023-02-15 NOTE — [facility] Suicide Risk Assessment (Signed)
[facility] Admission Suicide Risk Assessment   Nursing information obtained from:  Patient Demographic factors:  Male Current Mental Status:  NA Loss Factors:  Financial problems / change in socioeconomic status Historical Factors:  NA Risk Reduction Factors:  NA  Total Time spent with patient: 45 minutes Principal Problem: Schizoaffective disorder (HCC) Diagnosis:  Principal Problem:   Schizoaffective disorder (HCC)  Subjective Data:  Billy Rose is a 22 yr old male who presented on 9/13 to [facility] with complaints of delusions and insomnia, he was admitted to [facility] on 9/15.  PPHx is significant for Schizoaffective Disorder, Depression, PTSD, and a history of Polysubstance Abuse and Substance induced Psychosis, and 3 Suicide Attempts (OD- Prozac 2023), Self Injurious Behavior (Cutting- last 2023), and Multiple Psychiatric Hospitalizations (last- [facility] 09/2022).    When asked why he presented to the hospital he reports it is due to stress and anxiety.  He reports he is having issues with neighbors, relationship with his family, and relationship with God.  He reports that he stopped taking his medications after he was last discharged.  He reports that this was due to his legs shaking as a side effect from his medications.  He reports he has been having trouble sleeping as well.   Reports past psychiatric history significant for Schizoaffective Disorder, Depression, PTSD, and a history of Polysubstance Abuse and Substance induced Psychosis.  He reports 3 Suicide Attempts- last OD Prozac 2023.  He reports a history of Self Injurious Behavior: Cutting- last 2023.  He reports a history of multiple psychiatric hospitalizations- last [facility] 09/2022.  He reports no significant past medical history.  He reports no significant past surgical history.  He reports no history of head trauma.  He reports no history of seizures.  He reports NKDA.   He reports he currently lives in an apartment with a roommate.  He  reports he is not currently employed.  He reports he graduated high school.  He reports no alcohol use.  He reports smoking less than 1/4 pack per day of cigarettes.  He reports no substance use.  He reports no current legal issues.  He reports no access to firearms.   When asked about restarting medications he reports that he was started on Risperdal at the [facility].  He reports that he feels like this has been helpful so far and has been helping keep his mood stable but that he feels like it is not quite strong enough.  Discussed we could further increase it and he was agreeable with this.  Asked if he would be interested in starting an antidepressant to address his depression and anxiety and he declines at this time.  He reports no other concerns at present.   Attempt to call patient's mother, Billy Rose, [phone].  However, there was no answer.  Will attempt to contact later.   Per Billy Nixon NP [facility] H&P 9/13: "Patient states that he has been under a lot of stress and having problems with sleeping because his neighbors are "very loud and people stealing." When asked to further elaborate, he states that they are stealing his food and unscrewing his stuff in the house. He states that he lives alone in an apartment. His mother interjects and states that he has a roommate. He states that he has been sleeping about 4 to 5 hours per night for the past 4 months. He denies taking prescribed medications and states that he did not like the way the medications were making him  feel. He states that he stopped taking his medications 1 year ago. His mom states that he was previously prescribed Haldol, Cogentin, and Prozac when he left the hospital but stopped taking his medications around April 2024. The patient states that the Haldol was making his legs shake uncontrollably. He states that he does not need to take medications because he is not psychotic. He states that he has anxiety and he needs someone to  prescribe him medication for anxiety. He asked if he could be given ativan. Patient was offered hydroxyzine for anxiety, however, he declined. I explained to the patient that the hydroxyzine will be ordered as needed for anxiety. Patient denies suicidal ideations. He denies past suicide attempts. However, he does have a significant documented history of intentional overdoses on medication in suicide attempts. He reports self injurious behaviors but denies recent self-harm. He reports a history of cutting and shows this provider self inflicted cuts to his left forearm that appear to be old in nature. He states that he has not self harmed in 4 months. He denies homicidal ideations. He denies auditory or visual hallucinations. His mother interjects and tells him to be honest. She states that for the past 4 weeks he has been making lots of gestures, laughing at things out of the blue, talking to people who are not there, saying that he is talking to the voice of God. She states that he has also worn the same outfit everyday for the past 4 weeks without laundering it. She states that his behaviors have been going on for months. The patient does not deny his mother's complaints.  Patient denies drinking alcohol or using illicit drugs. The patient's mother also states that the patient was following up with [facility] when he was discharged from [facility] around spring time and decided to stop taking his medications at that time. Patient is currently not established with outpatient psychiatry for medication management or therapy.  She states that in the past the patient was prescribed lithium, Prozac, Zyprexa,  and that she is unsure about Risperdal. Patient states that in the past he was prescribed Ativan. Patient denies medical history."  Continued Clinical Symptoms:  Alcohol Use Disorder Identification Test Final Score (AUDIT): 0 The "Alcohol Use Disorders Identification Test", Guidelines for Use in Primary Care,  Second Edition.  World Science writer [facility]). Score between 0-7:  no or low risk or alcohol related problems. Score between 8-15:  moderate risk of alcohol related problems. Score between 16-19:  high risk of alcohol related problems. Score 20 or above:  warrants further diagnostic evaluation for alcohol dependence and treatment.   CLINICAL FACTORS:   Schizophrenia:   Less than 24 years old Currently Psychotic Unstable or Poor Therapeutic Relationship Previous Psychiatric Diagnoses and Treatments   Musculoskeletal: Strength & Muscle Tone: within normal limits Gait & Station: normal Patient leans: N/A  Psychiatric Specialty Exam:  Presentation  General Appearance:  Appropriate for Environment  Eye Contact: Poor  Speech: Clear and Coherent; Normal Rate  Speech Volume: Normal  Handedness: Right   Mood and Affect  Mood: Dysphoric; Anxious  Affect: Congruent   Thought Process  Thought Processes: Coherent; Goal Directed  Descriptions of Associations:Intact  Orientation:Full (Time, Place and Person)  Thought Content:WDL  History of Schizophrenia/Schizoaffective disorder:Yes  Duration of Psychotic Symptoms:Greater than six months  Hallucinations:Hallucinations: None  Ideas of Reference:None  Suicidal Thoughts:Suicidal Thoughts: No  Homicidal Thoughts:Homicidal Thoughts: No   Sensorium  Memory: Immediate Fair; Recent Fair  Judgment: Fair  Insight: Fair   Chartered certified accountant: Fair  Attention Span: Fair  Recall: Billy Rose of Knowledge: Fair  Language: Good   Psychomotor Activity  Psychomotor Activity: Psychomotor Activity: Normal   Assets  Assets: Housing; Resilience; Social Support; Financial Resources/Insurance   Sleep  Sleep: Sleep: Fair    Physical Exam: Physical Exam ROS Blood pressure 124/73, pulse (!) 125, temperature 98 F (36.7 C), temperature source Oral, resp. rate 16, height 5\' 8"   (1.727 m), weight 56.1 kg, SpO2 99%. Body mass index is 18.79 kg/m.   COGNITIVE FEATURES THAT CONTRIBUTE TO RISK:  Thought constriction (tunnel vision)    SUICIDE RISK:   Mild:  Suicidal ideation of limited frequency, intensity, duration, and specificity.  There are no identifiable plans, no associated intent, mild dysphoria and related symptoms, good self-control (both objective and subjective assessment), few other risk factors, and identifiable protective factors, including available and accessible social support.  PLAN OF CARE:   Billy Rose is a 22 yr old male who [facility] of delusions and insomnia, he was admitted to [facility] on 9/15.  PPHx is significant for Schizoaffective Disorder, Depression, PTSD, and a history of Polysubstance Abuse and Substance induced Psychosis, and 3 Suicide Attempts (OD- Prozac 2023), Self Injurious Behavior (Cutting- last 2023), and Multiple Psychiatric Hospitalizations (last- [facility] 09/2022).      Billy Rose has tolerated starting Risperdal and seems to be improving.  We will increase it to further address his symptoms.  To address his depression and anxiety we did offer an antidepressant but he is declining it at this time.  We will continue to monitor.      Schizoaffective Disorder, Depressive Type: -Increase Risperdal M-tabs to 1 mg BID for psychosis and mood stability.  -Continue Cogentin 0.5 mg BID PRN drug induced EPS -Continue Agitation Protocol: Haldol/Ativan/Benadryl     -Continue PRN's: Tylenol, Maalox, Atarax, Milk of Magnesia, Trazodone  I certify that inpatient services furnished can reasonably be expected to improve the patient's condition.   Lauro Franklin, MD 02/15/2023, 3:00 PM

## 2023-02-16 ENCOUNTER — Encounter (HOSPITAL_COMMUNITY): Payer: Self-pay

## 2023-02-16 DIAGNOSIS — F259 Schizoaffective disorder, unspecified: Secondary | ICD-10-CM | POA: Diagnosis not present

## 2023-02-16 NOTE — Progress Notes (Signed)
Pt had a visit with his mother this evening, pt stated it went well    02/16/23 2045  Psych Admission Type (Psych Patients Only)  Admission Status Voluntary  Psychosocial Assessment  Patient Complaints Anxiety  Eye Contact Fair  Facial Expression Anxious;Animated  Affect Anxious  Speech Logical/coherent  Interaction Assertive  Motor Activity Slow  Appearance/Hygiene Unremarkable  Behavior Characteristics Cooperative  Mood Anxious  Aggressive Behavior  Effect No apparent injury  Thought Process  Coherency Circumstantial  Content Blaming others;Delusions  Delusions Paranoid  Perception WDL  Hallucination None reported or observed  Judgment Impaired  Confusion None  Danger to Self  Current suicidal ideation? Denies

## 2023-02-16 NOTE — Group Note (Signed)
Recreation Therapy Group Note   Group Topic:Coping Skills  Group Date: 02/16/2023 Start Time: 1010 End Time: 1040 Facilitators: Gerad Cornelio-McCall, LRT,CTRS Location: [address] Dayroom   Goal Area(s) Addresses:  Patient will effectively work with peer towards shared goal.  Patient will identify skills used to make activity successful.  Patient will identify how skills used during activity can be applied to reach post d/c goals.   Intervention: STEM Activity- Glass blower/designer  Group Description: Tallest Pharmacist, community. In teams of 5-6, patients were given 11 craft pipe cleaners. Using the materials provided, patients were instructed to compete again the opposing team(s) to build the tallest free-standing structure from floor level. The activity was timed; difficulty increased by Clinical research associate as Production designer, theatre/television/film continued.  Systematically resources were removed with additional directions for example, placing one arm behind their back, working in silence, and shape stipulations. LRT facilitated post-activity discussion reviewing team processes and necessary communication skills involved in completion. Patients were encouraged to reflect how the skills utilized, or not utilized, in this activity can be incorporated to positively impact support systems post discharge.   Affect/Mood: N/A   Participation Level: Did not attend    Clinical Observations/Individualized Feedback:     Plan: Continue to engage patient in RT group sessions 2-3x/week.   Kacie Huxtable-McCall, LRT,CTRS 02/16/2023 1:05 PM

## 2023-02-16 NOTE — Plan of Care (Signed)
  Problem: Education: Goal: Mental status will improve Outcome: Progressing   Problem: Activity: Goal: Interest or engagement in activities will improve Outcome: Progressing Goal: Sleeping patterns will improve Outcome: Progressing   Problem: Safety: Goal: Periods of time without injury will increase Outcome: Progressing   Problem: Coping: Goal: Ability to demonstrate self-control will improve Outcome: Progressing

## 2023-02-16 NOTE — BH IP Treatment Plan (Signed)
Interdisciplinary Treatment and Diagnostic Plan Update  02/16/2023 Time of Session: 11:50AM Billy Rose MRN: [id number]  Principal Diagnosis: Schizoaffective disorder (HCC)  Secondary Diagnoses: Principal Problem:   Schizoaffective disorder (HCC)   Current Medications:  Current Facility-Administered Medications  Medication Dose Route Frequency Provider Last Rate Last Admin   acetaminophen (TYLENOL) tablet 650 mg  650 mg Oral Q6H PRN Ajibola, Ene A, NP       alum & mag hydroxide-simeth (MAALOX/MYLANTA) 200-200-20 MG/5ML suspension 30 mL  30 mL Oral Q4H PRN Ajibola, Ene A, NP       benztropine (COGENTIN) tablet 0.5 mg  0.5 mg Oral BID PRN Ardis Hughs, NP       diphenhydrAMINE (BENADRYL) capsule 50 mg  50 mg Oral TID PRN Ajibola, Ene A, NP       Or   diphenhydrAMINE (BENADRYL) injection 50 mg  50 mg Intramuscular TID PRN Ajibola, Ene A, NP       haloperidol (HALDOL) tablet 5 mg  5 mg Oral TID PRN Ajibola, Ene A, NP       Or   haloperidol lactate (HALDOL) injection 5 mg  5 mg Intramuscular TID PRN Ajibola, Ene A, NP       hydrOXYzine (ATARAX) tablet 25 mg  25 mg Oral TID PRN Ajibola, Ene A, NP       LORazepam (ATIVAN) tablet 2 mg  2 mg Oral TID PRN Ajibola, Ene A, NP       Or   LORazepam (ATIVAN) injection 2 mg  2 mg Intramuscular TID PRN Ajibola, Ene A, NP       magnesium hydroxide (MILK OF MAGNESIA) suspension 30 mL  30 mL Oral Daily PRN Ajibola, Ene A, NP       risperiDONE (RISPERDAL M-TABS) disintegrating tablet 1 mg  1 mg Oral BID Lauro Franklin, MD   1 mg at 02/16/23 0819   traZODone (DESYREL) tablet 50 mg  50 mg Oral QHS PRN Ajibola, Ene A, NP   50 mg at 02/15/23 2019   PTA Medications: Medications Prior to Admission  Medication Sig Dispense Refill Last Dose   benztropine (COGENTIN) 0.5 MG tablet Take 1 tablet (0.5 mg total) by mouth 2 (two) times daily. 60 tablet 0    risperiDONE (RISPERDAL M-TABS) 0.5 MG disintegrating tablet Take 1 tablet (0.5 mg total) by  mouth at bedtime.       Patient Stressors:    Patient Strengths:    Treatment Modalities: Medication Management, Group therapy, Case management,  1 to 1 session with clinician, Psychoeducation, Recreational therapy.   Physician Treatment Plan for Primary Diagnosis: Schizoaffective disorder (HCC) Long Term Goal(s): Improvement in symptoms so as ready for discharge   Short Term Goals: Ability to identify changes in lifestyle to reduce recurrence of condition will improve Ability to verbalize feelings will improve Ability to demonstrate self-control will improve Ability to identify and develop effective coping behaviors will improve Ability to maintain clinical measurements within normal limits will improve Compliance with prescribed medications will improve Ability to identify triggers associated with substance abuse/mental health issues will improve  Medication Management: Evaluate patient's response, side effects, and tolerance of medication regimen.  Therapeutic Interventions: 1 to 1 sessions, Unit Group sessions and Medication administration.  Evaluation of Outcomes: Progressing  Physician Treatment Plan for Secondary Diagnosis: Principal Problem:   Schizoaffective disorder (HCC)  Long Term Goal(s): Improvement in symptoms so as ready for discharge   Short Term Goals: Ability to identify changes in lifestyle to reduce  recurrence of condition will improve Ability to verbalize feelings will improve Ability to demonstrate self-control will improve Ability to identify and develop effective coping behaviors will improve Ability to maintain clinical measurements within normal limits will improve Compliance with prescribed medications will improve Ability to identify triggers associated with substance abuse/mental health issues will improve     Medication Management: Evaluate patient's response, side effects, and tolerance of medication regimen.  Therapeutic Interventions: 1 to  1 sessions, Unit Group sessions and Medication administration.  Evaluation of Outcomes: Not Progressing   RN Treatment Plan for Primary Diagnosis: Schizoaffective disorder (HCC) Long Term Goal(s): Knowledge of disease and therapeutic regimen to maintain health will improve  Short Term Goals: Ability to remain free from injury will improve, Ability to verbalize frustration and anger appropriately will improve, Ability to participate in decision making will improve, Ability to verbalize feelings will improve, Ability to identify and develop effective coping behaviors will improve, and Compliance with prescribed medications will improve  Medication Management: RN will administer medications as ordered by provider, will assess and evaluate patient's response and provide education to patient for prescribed medication. RN will report any adverse and/or side effects to prescribing provider.  Therapeutic Interventions: 1 on 1 counseling sessions, Psychoeducation, Medication administration, Evaluate responses to treatment, Monitor vital signs and CBGs as ordered, Perform/monitor CIWA, COWS, AIMS and Fall Risk screenings as ordered, Perform wound care treatments as ordered.  Evaluation of Outcomes: Not Progressing   LCSW Treatment Plan for Primary Diagnosis: Schizoaffective disorder (HCC) Long Term Goal(s): Safe transition to appropriate next level of care at discharge, Engage patient in therapeutic group addressing interpersonal concerns.  Short Term Goals: Engage patient in aftercare planning with referrals and resources, Increase social support, Increase emotional regulation, Facilitate acceptance of mental health diagnosis and concerns, Identify triggers associated with mental health/substance abuse issues, and Increase skills for wellness and recovery  Therapeutic Interventions: Assess for all discharge needs, 1 to 1 time with Social worker, Explore available resources and support systems, Assess  for adequacy in community support network, Educate family and significant other(s) on suicide prevention, Complete Psychosocial Assessment, Interpersonal group therapy.  Evaluation of Outcomes: Not Progressing   Progress in Treatment: Attending groups: No. Participating in groups: No. Taking medication as prescribed: Yes. Toleration medication: Yes. Family/Significant other contact made: No, will contact:  mother Buck Ilgenfritz [phone] Patient understands diagnosis: No. Discussing patient identified problems/goals with staff: Yes. Medical problems stabilized or resolved: No. Denies suicidal/homicidal ideation: Yes. Issues/concerns per patient self-inventory: No.  New problem(s) identified: No, Describe:  none reported  New Short Term/Long Term Goal(s):medication stabilization, elimination of SI thoughts, development of comprehensive mental wellness plan.    Patient Goals:  "treat my stress and be discharged"  Discharge Plan or Barriers: Patient recently admitted. CSW will continue to follow and assess for appropriate referrals and possible discharge planning.    Reason for Continuation of Hospitalization: Delusions  Depression Medication stabilization  Estimated Length of Stay:5-7 days  Last 3 [place] Suicide Severity Risk Score: Flowsheet Row [facility]Dis[facility]  C-SSRS RISK CATEGORY No Risk No Risk No Risk       Last PHQ 2/9 Scores:     No data to display          Scribe for Treatment Team: Izell Paisley, LCSW 02/16/2023 2:06 PM

## 2023-02-16 NOTE — Progress Notes (Signed)
Recreation Therapy Notes  INPATIENT RECREATION THERAPY ASSESSMENT  Patient Details Name: Billy Rose MRN: [id number] DOB: April 25, 2001 Today's Date: 02/16/2023       Information Obtained From: Patient  Able to Participate in Assessment/Interview: Yes  Patient Presentation: Alert  Reason for Admission (Per Patient): Other (Comments) (Pt stated stress and trouble sleeping.)  Patient Stressors: Other (Comment) Transport planner)  Coping Skills:   Talk, Prayer, Avoidance, Read  Leisure Interests (2+):  Exercise - Walking  Frequency of Recreation/Participation: Other (Comment) (Daily)  Awareness of Community Resources:  Yes  Community Resources:  Coffee Shop, Public affairs consultant, Tree surgeon  Current Use: Yes  If no, Barriers?:    Expressed Interest in [employer] Information: No  County of Residence:  [place]  Patient Main Form of Transportation: Walk  Patient Strengths:  Kindness  Patient Identified Areas of Improvement:  None  Patient Goal for Hospitalization:  "discharge"  Current SI (including self-harm):  No  Current HI:  No  Current AVH: No  Staff Intervention Plan: Group Attendance, Collaborate with Interdisciplinary Treatment Team  Consent to Intern Participation: N/A   Amneet Cendejas-McCall, LRT,CTRS Graci Hulce A Marjo Grosvenor-McCall 02/16/2023, 1:45 PM

## 2023-02-16 NOTE — Progress Notes (Signed)
DAR NOTE: Patient presents with anxious affect and depressed mood.  Denies suicidal thoughts, pain, auditory and visual hallucinations.  Rates depression at 0, hopelessness at 0, and anxiety at 10.  Maintained on routine safety checks.  Medications given as prescribed.  Support and encouragement offered as needed.  Attended group and participated.  States goal for today is "discharge."  Patient observed pacing the hallway.  Preoccupied with getting discharge.  Patient is safe on the unit.

## 2023-02-16 NOTE — Progress Notes (Signed)
[facility] MD Progress Note  02/16/2023 2:14 PM Billy Rose  MRN:  [id number] Subjective:   Billy Rose is a 22 yr old male who presented on 9/13 to [facility] with complaints of delusions and insomnia, he was admitted to [facility] on 9/15. PPHx is significant for Schizoaffective Disorder, Depression, PTSD, and a history of Polysubstance Abuse and Substance induced Psychosis, and 3 Suicide Attempts (OD- Prozac 2023), Self Injurious Behavior (Cutting- last 2023), and Multiple Psychiatric Hospitalizations (last- [facility] 09/2022).    Case was discussed in the multidisciplinary team. MAR was reviewed and patient was compliant with medications.  He received PRN Trazodone last night.   Psychiatric Team made the following recommendations yesterday: -Increase Risperdal M-tabs to 1 mg BID for psychosis and mood stability.      On interview today patient reports he slept good last night.  He reports his appetite is doing good.  He reports no SI, HI, or AVH.  He reports no Paranoia or Ideas of Reference.  He reports no issues with his medications.  Discussed if he had been on an LAI in the past that he reports that he has.  He reports that he did not like how he felt well on them and he is not interested in starting an LAI at this time.  Discussed with him that there have been attempts to contact his mother yesterday but there had been no response.  Encouraged him to reach out to his family to help establish contact and he reports he will.  He reports no other concerns at present.  Principal Problem: Schizoaffective disorder (HCC) Diagnosis: Principal Problem:   Schizoaffective disorder (HCC)  Total Time spent with patient:  I personally spent 35 minutes on the unit in direct patient care. The direct patient care time included face-to-face time with the patient, reviewing the patient's chart, communicating with other professionals, and coordinating care. Greater than 50% of this time was spent in counseling or  coordinating care with the patient regarding goals of hospitalization, psycho-education, and discharge planning needs.   Past Psychiatric History: Schizoaffective Disorder, Depression, PTSD, and a history of Polysubstance Abuse and Substance induced Psychosis, and 3 Suicide Attempts (OD- Prozac 2023), Self Injurious Behavior (Cutting- last 2023), and Multiple Psychiatric Hospitalizations (last- [facility] 09/2022).   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 No pertinent surgical history. Family History:  Family History  Problem Relation Age of Onset   Allergic rhinitis Neg Hx    Asthma Neg Hx    Eczema Neg Hx    Urticaria Neg Hx    Family Psychiatric  History:  Father- Bipolar Disorder No Known Substance Abuse or Suicides  Social History:  Social History   Substance and Sexual Activity  Alcohol Use Yes   Comment: "4 beers every so often"     Social History   Substance and Sexual Activity  Drug Use Not Currently   Types: Cocaine, LSD, Marijuana, IV    Social History   Socioeconomic History   Marital status: Single    Spouse name: Not on file   Number of children: Not on file   Years of education: Not on file   Highest education level: Not on file  Occupational History   Not on file  Tobacco Use  Smoking status: Every Day    Current packs/day: 1.00    Types: Cigarettes   Smokeless tobacco: Never  Vaping Use   Vaping status: Never Used  Substance and Sexual Activity   Alcohol use: Yes    Comment: "4 beers every so often"   Drug use: Not Currently    Types: Cocaine, LSD, Marijuana, IV   Sexual activity: Not Currently  Other Topics Concern   Not on file  Social History Narrative   ** Merged History Encounter **       Social Determinants of  Health   Financial Resource Strain: Not on file  Food Insecurity: No Food Insecurity (02/14/2023)   Hunger Vital Sign    Worried About Running Out of Food in the Last Year: Never true    Ran Out of Food in the Last Year: Never true  Transportation Needs: No Transportation Needs (02/14/2023)   PRAPARE - Administrator, Civil Service (Medical): No    Lack of Transportation (Non-Medical): No  Physical Activity: Not on file  Stress: Not on file  Social Connections: Not on file   Additional Social History:                         Sleep: Good  Appetite:  Good  Current Medications: Current Facility-Administered Medications  Medication Dose Route Frequency Provider Last Rate Last Admin   acetaminophen (TYLENOL) tablet 650 mg  650 mg Oral Q6H PRN Ajibola, Ene A, NP       alum & mag hydroxide-simeth (MAALOX/MYLANTA) 200-200-20 MG/5ML suspension 30 mL  30 mL Oral Q4H PRN Ajibola, Ene A, NP       benztropine (COGENTIN) tablet 0.5 mg  0.5 mg Oral BID PRN Ardis Hughs, NP       diphenhydrAMINE (BENADRYL) capsule 50 mg  50 mg Oral TID PRN Ajibola, Ene A, NP       Or   diphenhydrAMINE (BENADRYL) injection 50 mg  50 mg Intramuscular TID PRN Ajibola, Ene A, NP       haloperidol (HALDOL) tablet 5 mg  5 mg Oral TID PRN Ajibola, Ene A, NP       Or   haloperidol lactate (HALDOL) injection 5 mg  5 mg Intramuscular TID PRN Ajibola, Ene A, NP       hydrOXYzine (ATARAX) tablet 25 mg  25 mg Oral TID PRN Ajibola, Ene A, NP       LORazepam (ATIVAN) tablet 2 mg  2 mg Oral TID PRN Ajibola, Ene A, NP       Or   LORazepam (ATIVAN) injection 2 mg  2 mg Intramuscular TID PRN Ajibola, Ene A, NP       magnesium hydroxide (MILK OF MAGNESIA) suspension 30 mL  30 mL Oral Daily PRN Ajibola, Ene A, NP       risperiDONE (RISPERDAL M-TABS) disintegrating tablet 1 mg  1 mg Oral BID Lauro Franklin, MD   1 mg at 02/16/23 0819   traZODone (DESYREL) tablet 50 mg  50 mg Oral QHS PRN Ajibola,  Ene A, NP   50 mg at 02/15/23 2019    Lab Results: No results found for this or any previous visit (from the past 48 hour(s)).  Blood Alcohol level:  Lab Results  Component Value Date   [facility] <10 02/13/2023   ETH <10 05/19/2022    Metabolic Disorder Labs: Lab Results  Component Value Date   HGBA1C 5.3 02/13/2023  MPG 105 02/13/2023   MPG 99.67 02/07/2022   Lab Results  Component Value Date   PROLACTIN 24.1 (H) 05/22/2020   PROLACTIN 32.1 (H) 02/15/2017   Lab Results  Component Value Date   CHOL 151 02/13/2023   TRIG 100 02/13/2023   HDL 56 02/13/2023   CHOLHDL 2.7 02/13/2023   VLDL 20 02/13/2023   LDLCALC 75 02/13/2023   LDLCALC 61 02/07/2022    Physical Findings: AIMS:  , ,  ,  ,    CIWA:    COWS:     Musculoskeletal: Strength & Muscle Tone: within normal limits Gait & Station: normal Patient leans: N/A  Psychiatric Specialty Exam:  Presentation  General Appearance:  Appropriate for Environment; Casual  Eye Contact: Fair  Speech: Clear and Coherent; Normal Rate  Speech Volume: Normal  Handedness: Right   Mood and Affect  Mood: Dysphoric  Affect: Congruent (detached/terse in responses)   Thought Process  Thought Processes: Coherent; Goal Directed  Descriptions of Associations:Intact  Orientation:Full (Time, Place and Person)  Thought Content:Logical; WDL  History of Schizophrenia/Schizoaffective disorder:Yes  Duration of Psychotic Symptoms:Greater than six months  Hallucinations:Hallucinations: None  Ideas of Reference:None  Suicidal Thoughts:Suicidal Thoughts: No  Homicidal Thoughts:Homicidal Thoughts: No   Sensorium  Memory: Immediate Fair; Recent Fair  Judgment: Fair  Insight: Fair   Art therapist  Concentration: Fair  Attention Span: Fair  Recall: Fair  Fund of Knowledge: Fair  Language: Good   Psychomotor Activity  Psychomotor Activity: Psychomotor Activity: Normal   Assets   Assets: Manufacturing systems engineer; Housing; Resilience; Social Support; Financial Resources/Insurance   Sleep  Sleep: Sleep: Good Number of Hours of Sleep: 8.75    Physical Exam: Physical Exam Vitals and nursing note reviewed.  Constitutional:      General: He is not in acute distress.    Appearance: Normal appearance. He is normal weight. He is not ill-appearing or toxic-appearing.  HENT:     Head: Normocephalic and atraumatic.  Pulmonary:     Effort: Pulmonary effort is normal.  Musculoskeletal:        General: Normal range of motion.  Neurological:     General: No focal deficit present.     Mental Status: He is alert.    Review of Systems  Respiratory:  Negative for cough and shortness of breath.   Cardiovascular:  Negative for chest pain.  Gastrointestinal:  Negative for abdominal pain, constipation, diarrhea, nausea and vomiting.  Neurological:  Negative for dizziness, weakness and headaches.  Psychiatric/Behavioral:  Negative for depression, hallucinations and suicidal ideas. The patient is not nervous/anxious.    Blood pressure (!) 126/95, pulse (!) 138, temperature 98.1 F (36.7 C), temperature source Oral, resp. rate 16, height 5\' 8"  (1.727 m), weight 56.1 kg, SpO2 100%. Body mass index is 18.79 kg/m.   Treatment Plan Summary: Daily contact with patient to assess and evaluate symptoms and progress in treatment and Medication management  Billy Rose is a 22 yr old male who presented on 9/13 to [facility] with complaints of delusions and insomnia, he was admitted to [facility] on 9/15. PPHx is significant for Schizoaffective Disorder, Depression, PTSD, and a history of Polysubstance Abuse and Substance induced Psychosis, and 3 Suicide Attempts (OD- Prozac 2023), Self Injurious Behavior (Cutting- last 2023), and Multiple Psychiatric Hospitalizations (last- [facility] 09/2022).    Alif does seem to be improving with the Risperdal.  Discussed LAI with him but he is not interested  at this time.  Encouraged him to reach out to  family to help establish a baseline as he is no longer reporting paranoia and is sleeping well.  We will not make any changes to his medications at this time.  We will continue to monitor.   Schizoaffective Disorder, Depressive Type: -Continue Risperdal M-tabs 1 mg BID for psychosis and mood stability.  -Continue Cogentin 0.5 mg BID PRN drug induced EPS -Continue Agitation Protocol: Haldol/Ativan/Benadryl     -Continue PRN's: Tylenol, Maalox, Atarax, Milk of Magnesia, Trazodone   Lauro Franklin, MD 02/16/2023, 2:14 PM

## 2023-02-16 NOTE — Group Note (Signed)
Date:  02/16/2023 Time:  8:32 PM  Group Topic/Focus:  Wrap-Up Group:   The focus of this group is to help patients review their daily goal of treatment and discuss progress on daily workbooks.    Participation Level:  Active  Participation Quality:  Appropriate  Affect:  Appropriate  Cognitive:  Appropriate  Insight: Appropriate  Engagement in Group:  Engaged  Modes of Intervention:  Education and Exploration  Additional Comments:  Patient attended and participated in group tonight. He reports that his mother is his greatest support system.  Lita Mains [facility] 02/16/2023, 8:32 PM

## 2023-02-16 NOTE — Group Note (Signed)
LCSW Group Therapy Note    Group Date: 02/16/2023 Start Time: 1300 End Time: 1400   Type of Therapy and Topic: Group Therapy: Body Image  Participation Level:  Did Not Attend  Description of Group:  Patients were educated about body image and asked to think about whether they have a healthy or unhealthy body image. Patients were led in a discussion about factors that contribute to body image, both internal and external. Patients were asked to discuss strengths of the human body outside of appearance, such as being able to fight off diseases and provide stress relief. Lastly, patients were asked to identify one way in which they appreciate their own body outside of appearance.   Therapeutic Goals:   1. Patient will differentiate between a healthy and unhealthy body image. 2. Patient will identify what contributes to body image 3. Patient will discuss the strengths of the human body. 4. Patient will identify a positive attribute of their body outside of physical appearance.  Summary of Patient Progress:  Did Not Attend   Therapeutic Modalities: Cognitive Behavioral Therapy; Solution-Focused Therapy  Beather Arbour 02/16/2023  2:32 PM

## 2023-02-17 DIAGNOSIS — F259 Schizoaffective disorder, unspecified: Secondary | ICD-10-CM | POA: Diagnosis not present

## 2023-02-17 MED ORDER — RISPERIDONE 2 MG PO TABS
2.0000 mg | ORAL_TABLET | Freq: Every day | ORAL | Status: DC
  Filled 2023-02-17: qty 1

## 2023-02-17 MED ORDER — TRAZODONE HCL 50 MG PO TABS: 50.0000 mg | ORAL_TABLET | Freq: Every evening | ORAL | 0 refills | Status: DC | PRN

## 2023-02-17 MED ORDER — BENZTROPINE MESYLATE 0.5 MG PO TABS: 0.5000 mg | ORAL_TABLET | Freq: Two times a day (BID) | ORAL | 0 refills | Status: DC | PRN

## 2023-02-17 MED ORDER — RISPERIDONE 2 MG PO TABS: 2.0000 mg | ORAL_TABLET | Freq: Every day | ORAL | 0 refills | Status: DC

## 2023-02-17 NOTE — BH Assessment (Signed)
Pt ambulatory in hallway. Calm and cooperative.Still delusion but denies SI, HI, and AVH. His mood is suspicious although he denies paranoia.He spoke with psychiatrist and is requesting to be discharged. He had a visit from his Mother last pm.Vital signs are stable and is in  no distress at present.

## 2023-02-17 NOTE — Progress Notes (Signed)
I called the pts mother with pt's permission, using speaker phone (myself, mother, pt all on phone at the same time).   Mother's info: [place] Mother Emergency Contact [phone]  We discussed the pt's diagnosis, hospital course, treatment, response to treatment, and discharge planning. We discussed the option of LAI and it was our recommendation for an LAI, and mother agrees, but pt declined.  Mother is agreeable with discharge and the discharge plan including importance of medication adherence and abstinence from drugs.   At the end of the call, the caller and patient had no further questions, and were both agreeable to dc today.

## 2023-02-17 NOTE — Discharge Instructions (Signed)
-  Follow-up with your outpatient psychiatric provider -instructions on appointment date, time, and address (location) are provided to you in discharge paperwork.  -Take your psychiatric medications as prescribed at discharge - instructions are provided to you in the discharge paperwork  -Follow-up with outpatient primary care doctor and other specialists -for management of preventative medicine and any chronic medical disease.  -Recommend abstinence from alcohol, tobacco, and other illicit drug use at discharge.   -If your psychiatric symptoms recur, worsen, or if you have side effects to your psychiatric medications, call your outpatient psychiatric provider, 911, 988 or go to the nearest emergency department.  -If suicidal thoughts occur, call your outpatient psychiatric provider, 911, 988 or go to the nearest emergency department.  Naloxone (Narcan) can help reverse an overdose when given to the victim quickly.  [facility] offers free naloxone kits and instructions/training on its use.  Add naloxone to your first aid kit and you can help save a life.   Pick up your free kit at the following locations:   [place]:  [facility], [address] [place] [address] [phone]) [facility] [address] [place] [address] [phone]) [facility] [address] [place] [address]  [place]: [facility] [address] [place] [address] ([phone]) [facility] [address] [place] [address] [phone])

## 2023-02-17 NOTE — Progress Notes (Signed)
[facility]/BMU LCSW Progress Note   02/17/2023    10:18 AM  Andras Nelligan      Type of Note: Estate agent   CSW spoke with patient and asked if he had roommate number to do safety planning. Pt stated that he did not have a number, but will go stay with his mom if he needs to so he can DC. CSW informed provider of this shared information.     Signed:   Jacob Moores, MSW, [facility] 02/17/2023 10:18 AM

## 2023-02-17 NOTE — Discharge Summary (Signed)
Physician Discharge Summary Note  Patient:  Billy Rose is an 22 y.o., male MRN:  [id number] DOB:  09/16/2000 Patient phone:  [phone] (home)  Patient address:   [address] [place] [address],  Total Time spent with patient: 20 minutes  Date of Admission:  02/14/2023 Date of Discharge: 09-17-202   Reason for Admission:    Billy Rose is a 22 yr old male who presented on 9/13 to [facility] with complaints of delusions and insomnia, he was admitted to [facility] on 9/15.  PPHx is significant for Schizoaffective Disorder, Depression, PTSD, and a history of Polysubstance Abuse and Substance induced Psychosis, and 3 Suicide Attempts (OD- Prozac 2023), Self Injurious Behavior (Cutting- last 2023), and Multiple Psychiatric Hospitalizations (last- [facility] 09/2022).    When asked why he presented to the hospital he reports it is due to stress and anxiety.  He reports he is having issues with neighbors, relationship with his family, and relationship with God.  He reports that he stopped taking his medications after he was last discharged.  He reports that this was due to his legs shaking as a side effect from his medications.  He reports he has been having trouble sleeping as well.   Reports past psychiatric history significant for Schizoaffective Disorder, Depression, PTSD, and a history of Polysubstance Abuse and Substance induced Psychosis.  He reports 3 Suicide Attempts- last OD Prozac 2023.  He reports a history of Self Injurious Behavior: Cutting- last 2023.  He reports a history of multiple psychiatric hospitalizations- last [facility] 09/2022.  He reports no significant past medical history.  He reports no significant past surgical history.  He reports no history of head trauma.  He reports no history of seizures.  He reports NKDA.   He reports he currently lives in an apartment with a roommate.  He reports he is not currently employed.  He reports he graduated high school.  He reports  no alcohol use.  He reports smoking less than 1/4 pack per day of cigarettes.  He reports no substance use.  He reports no current legal issues.  He reports no access to firearms.   When asked about restarting medications he reports that he was started on Risperdal at the [facility]  He reports that he feels like this has been helpful so far and has been helping keep his mood stable but that he feels like it is not quite strong enough.  Discussed we could further increase it and he was agreeable with this.  Asked if he would be interested in starting an antidepressant to address his depression and anxiety and he declines at this time.  He reports no other concerns at present.   Principal Problem: Schizoaffective disorder [place]) Discharge Diagnoses: Principal Problem:   Schizoaffective disorder (HCC)   Past Psychiatric History:  Schizoaffective Disorder, Depression, PTSD, and a history of Polysubstance Abuse and Substance induced Psychosis, and 3 Suicide Attempts (OD- Prozac 2023), Self Injurious Behavior (Cutting- last 2023), and Multiple Psychiatric Hospitalizations (last- [facility] 09/2022).     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 No pertinent surgical history. Family History:  Family History  Problem Relation Age of Onset   Allergic rhinitis Neg Hx    Asthma Neg Hx  Eczema Neg Hx    Urticaria Neg Hx    Family Psychiatric  History:  See H&P    Social History:  Social History   Substance and Sexual Activity  Alcohol Use Yes   Comment: "4 beers every so often"     Social History   Substance and Sexual Activity  Drug Use Not Currently   Types: Cocaine, LSD, Marijuana, IV    Social History   Socioeconomic History   Marital status: Single    Spouse name:  Not on file   Number of children: Not on file   Years of education: Not on file   Highest education level: Not on file  Occupational History   Not on file  Tobacco Use   Smoking status: Every Day    Current packs/day: 1.00    Types: Cigarettes   Smokeless tobacco: Never  Vaping Use   Vaping status: Never Used  Substance and Sexual Activity   Alcohol use: Yes    Comment: "4 beers every so often"   Drug use: Not Currently    Types: Cocaine, LSD, Marijuana, IV   Sexual activity: Not Currently  Other Topics Concern   Not on file  Social History Narrative   ** Merged History Encounter **       Social Determinants of Health   Financial Resource Strain: Not on file  Food Insecurity: No Food Insecurity (02/14/2023)   Hunger Vital Sign    Worried About Running Out of Food in the Last Year: Never true    Ran Out of Food in the Last Year: Never true  Transportation Needs: No Transportation Needs (02/14/2023)   PRAPARE - Administrator, Civil Service (Medical): No    Lack of Transportation (Non-Medical): No  Physical Activity: Not on file  Stress: Not on file  Social Connections: Not on file    Hospital Course:   During the patient's hospitalization, patient had extensive initial psychiatric evaluation, and follow-up psychiatric evaluations every day.  Psychiatric diagnoses provided upon initial assessment:  -Schizoaffective disorder, depressive type   Patient's psychiatric medications were adjusted on admission:  -Increase Risperdal M-tabs to 1 mg BID for psychosis and mood stability.   During the hospitalization, other adjustments were made to the patient's psychiatric medication regimen:  -risperdal was consolidated and changed from 1 mg bi dto 2 mg at bedtime at discharge after speaking with the pt and the mother   Patient's care was discussed during the interdisciplinary team meeting every day during the hospitalization.  The patient denied having any having  side effects to prescribed psychiatric medication.  Gradually, patient started adjusting to milieu. The patient was evaluated each day by a clinical provider to ascertain response to treatment. Improvement was noted by the patient's report of decreasing symptoms, improved sleep and appetite, affect, medication tolerance, behavior, and participation in unit programming.  Patient was asked each day to complete a self inventory noting mood, mental status, pain, new symptoms, anxiety and concerns.    Symptoms were reported as significantly decreased or resolved completely by discharge.   On day of discharge, the patient reports that their mood is stable. The patient denied having suicidal thoughts for more than 48 hours prior to discharge.  Patient denies having homicidal thoughts.  Patient denies having auditory hallucinations.  Patient denies any visual hallucinations or other symptoms of psychosis. The patient was motivated to continue taking medication with a goal of continued improvement in mental health.   The patient reports  their target psychiatric symptoms of psychosis and anxiety, all responded well to the psychiatric medications, and the patient reports overall benefit other psychiatric hospitalization. Supportive psychotherapy was provided to the patient. The patient also participated in regular group therapy while hospitalized. Coping skills, problem solving as well as relaxation therapies were also part of the unit programming.  Labs were reviewed with the patient, and abnormal results were discussed with the patient.  The patient is able to verbalize their individual safety plan to this provider.  # It is recommended to the patient to continue psychiatric medications as prescribed, after discharge from the hospital.    # It is recommended to the patient to follow up with your outpatient psychiatric provider and PCP.  # It was discussed with the patient, the impact of alcohol, drugs,  tobacco have been there overall psychiatric and medical wellbeing, and total abstinence from substance use was recommended the patient.ed.  # Prescriptions provided or sent directly to preferred pharmacy at discharge. Patient agreeable to plan. Given opportunity to ask questions. Appears to feel comfortable with discharge.    # In the event of worsening symptoms, the patient is instructed to call the crisis hotline, 911 and or go to the nearest ED for appropriate evaluation and treatment of symptoms. To follow-up with primary care provider for other medical issues, concerns and or health care needs  # Patient was discharged to care of mother, with a plan to follow up as noted below.   Physical Findings: AIMS:  , ,  ,  ,    CIWA:    COWS:     Aims score zero on my exam. No eps on my exam.   Musculoskeletal: Strength & Muscle Tone: within normal limits Gait & Station: normal Patient leans: N/A   Psychiatric Specialty Exam:  Presentation  General Appearance:  Appropriate for Environment; Casual; Fairly Groomed  Eye Contact: Good  Speech: Normal Rate; Clear and Coherent  Speech Volume: Normal  Handedness: Right   Mood and Affect  Mood: Euthymic  Affect: Appropriate; Congruent; Full Range   Thought Process  Thought Processes: Linear  Descriptions of Associations:Intact  Orientation:Full (Time, Place and Person)  Thought Content:Logical  History of Schizophrenia/Schizoaffective disorder:Yes  Duration of Psychotic Symptoms:Greater than six months  Hallucinations:Hallucinations: None  Ideas of Reference:None  Suicidal Thoughts:Suicidal Thoughts: No  Homicidal Thoughts:Homicidal Thoughts: No   Sensorium  Memory: Immediate Good; Recent Good; Remote Good  Judgment: Impaired  Insight: Lacking   Executive Functions  Concentration: Fair  Attention Span: Fair  Recall: Good  Fund of Knowledge: Good  Language: Good   Psychomotor  Activity  Psychomotor Activity: Psychomotor Activity: Normal   Assets  Assets: Communication Skills; Housing; Resilience; Social Support; Financial Resources/Insurance   Sleep  Sleep: Sleep: Fair Number of Hours of Sleep: 8.75    Physical Exam: Physical Exam Vitals reviewed.  Constitutional:      General: He is not in acute distress.    Appearance: He is normal weight. He is not toxic-appearing.  Pulmonary:     Effort: Pulmonary effort is normal. No respiratory distress.  Neurological:     Mental Status: He is alert.     Motor: No weakness.     Gait: Gait normal.  Psychiatric:        Mood and Affect: Mood normal.        Behavior: Behavior normal.        Thought Content: Thought content normal.    Review of Systems  Constitutional:  Negative for chills and fever.  Cardiovascular:  Negative for chest pain and palpitations.  Neurological:  Negative for dizziness, tingling, tremors and headaches.  Psychiatric/Behavioral:  Negative for depression, hallucinations, memory loss, substance abuse and suicidal ideas. The patient is not nervous/anxious and does not have insomnia.   All other systems reviewed and are negative.  Blood pressure 107/80, pulse (!) 122, temperature 97.7 F (36.5 C), temperature source Oral, resp. rate 16, height 5\' 8"  (1.727 m), weight 56.1 kg, SpO2 98%. Body mass index is 18.79 kg/m.   Social History   Tobacco Use  Smoking Status Every Day   Current packs/day: 1.00   Types: Cigarettes  Smokeless Tobacco Never   Tobacco Cessation:  Prescription not provided because: pt declined treatment for tobacco use    Blood Alcohol level:  Lab Results  Component Value Date   ETH <10 02/13/2023   ETH <10 05/19/2022    Metabolic Disorder Labs:  Lab Results  Component Value Date   HGBA1C 5.3 02/13/2023   MPG 105 02/13/2023   MPG 99.67 02/07/2022   Lab Results  Component Value Date   PROLACTIN 24.1 (H) 05/22/2020   PROLACTIN 32.1 (H)  02/15/2017   Lab Results  Component Value Date   CHOL 151 02/13/2023   TRIG 100 02/13/2023   HDL 56 02/13/2023   CHOLHDL 2.7 02/13/2023   VLDL 20 02/13/2023   LDLCALC 75 02/13/2023   LDLCALC 61 02/07/2022    See Psychiatric Specialty Exam and Suicide Risk Assessment completed by Attending Physician prior to discharge.  Discharge destination:  Home  Is patient on multiple antipsychotic therapies at discharge:  No   Has Patient had three or more failed trials of antipsychotic monotherapy by history:  No  Recommended Plan for Multiple Antipsychotic Therapies: NA  Discharge Instructions     Diet - low sodium heart healthy   Complete by: As directed    Increase activity slowly   Complete by: As directed       Allergies as of 02/17/2023       Reactions   Penicillins Anaphylaxis, Swelling, Other (See Comments)   Did it involve swelling of the face/tongue/throat, SOB, or low BP? Yes Did it involve sudden or severe rash/hives, skin peeling, or any reaction on the inside of your mouth or nose? Unk Did you need to seek medical attention at a hospital or doctor's office? Unk When did it last happen? Childhood If all above answers are "NO", may proceed with cephalosporin use.   Sulfa Antibiotics Anaphylaxis, Shortness Of Breath, Swelling, Rash, Other (See Comments)   Throat and face swell   Abilify [aripiprazole] Other (See Comments)   Worsened the patient        Medication List     STOP taking these medications    risperiDONE 0.5 MG disintegrating tablet Commonly known as: RISPERDAL M-TABS Replaced by: risperiDONE 2 MG tablet       TAKE these medications      Indication  benztropine 0.5 MG tablet Commonly known as: COGENTIN Take 1 tablet (0.5 mg total) by mouth 2 (two) times daily as needed for up to 10 days for tremors (EPS). What changed:  when to take this reasons to take this  Indication: Extrapyramidal Reaction caused by Medications   risperiDONE 2 MG  tablet Commonly known as: RISPERDAL Take 1 tablet (2 mg total) by mouth at bedtime. Start taking on: February 18, 2023 Replaces: risperiDONE 0.5 MG disintegrating tablet  Indication: Schizophrenia   traZODone 50 MG  tablet Commonly known as: DESYREL Take 1 tablet (50 mg total) by mouth at bedtime as needed for up to 14 days for sleep.  Indication: Trouble Sleeping        Follow-up [employer] Call on 02/24/2023.   Why: A referral has been made to this provider for therapy and medication management services.  You have an appointment on 02/24/23 at 1:45 pm. Contact information: [address]  [address] [place] [address] [phone]                 Follow-up recommendations:    Activity: as tolerated  Diet: heart healthy  Other: -Follow-up with your outpatient psychiatric provider -instructions on appointment date, time, and address (location) are provided to you in discharge paperwork.  -Take your psychiatric medications as prescribed at discharge - instructions are provided to you in the discharge paperwork  -Follow-up with outpatient primary care doctor and other specialists -for management of preventative medicine and chronic medical disease  -If you are prescribed an atypical antipsychotic medication, we recommend that your outpatient psychiatrist follow routine screening for side effects within 3 months of discharge, including monitoring: AIMS scale, height, weight, blood pressure, fasting lipid panel, HbA1c, and fasting blood sugar.   -Recommend total abstinence from alcohol, tobacco, and other illicit drug use at discharge.   -If your psychiatric symptoms recur, worsen, or if you have side effects to your psychiatric medications, call your outpatient psychiatric provider, 911, [phone] or go to the nearest emergency department.  -If suicidal thoughts occur, immediately call your outpatient psychiatric provider, 911, 988 or go to the nearest  emergency department.    Signed: Cristy Hilts, MD 02/17/2023, 10:17 AM   Total Time Spent in Direct Patient Care:  I personally spent 35 minutes on the unit in direct patient care. The direct patient care time included face-to-face time with the patient, reviewing the patient's chart, communicating with other professionals, and coordinating care. Greater than 50% of this time was spent in counseling or coordinating care with the patient regarding goals of hospitalization, psycho-education, and discharge planning needs.   Phineas Inches, MD Psychiatrist

## 2023-02-17 NOTE — Group Note (Deleted)
Recreation Therapy Group Note   Group Topic:Animal Assisted Therapy   Group Date: 02/17/2023 Start Time: 0950 End Time: 1035 Facilitators: Oakleigh Hesketh, Benito Mccreedy, LRT Location: [address] Dayroom  Animal-Assisted Activity (AAA) Program Checklist/Progress Note Patient Eligibility Criteria Checklist & Daily Group note for Rec Tx Intervention   AAA/T Program Assumption of Risk Form signed by Patient/ or Parent Legal Guardian YES  Patient is free of allergies or severe asthma  YES  Patient reports no fear of animals YES  Patient reports no history of cruelty to animals YES  Patient understands their participation is voluntary YES  Patient washes hands before animal contact YES  Patient washes hands after animal contact YES   Group Description: Patients provided opportunity to interact with trained and credentialed [employer] [facility] dog and the community volunteer/dog handler. Patients practiced appropriate animal interaction and were educated on dog safety outside of the hospital in common community settings. Patients were allowed to use dog toys and other items to practice commands, engage the dog in play, and/or complete routine aspects of animal care.   Education: Charity fundraiser, Health visitor, Communication & Social Skills     Affect/Mood: {RT BHH Affect/Mood:26271}   Participation Level: {RT BHH Participation [employer]   Participation Quality: {RT BHH Participation Quality:26268}   Behavior: {RT BHH Group Behavior:26269}   Speech/Thought Process: {RT BHH Speech/Thought:26276}   Insight: {RT BHH Insight:26272}   Judgement: {RT BHH Judgement:26278}   Modes of Intervention: {RT BHH Modes of Intervention:26277}   Patient Response to Interventions:  {RT BHH Patient Response to Intervention:26274}   Education Outcome:  {RT BHH Education Outcome:26279}   Clinical Observations/Individualized Feedback: *** was *** in their participation of session  activities and group discussion. Pt identified ***   Plan: {RT BHH Tx YQMV:78469}   Benito Mccreedy Annikah Lovins, LRT,  02/17/2023 11:46 AM

## 2023-02-17 NOTE — Group Note (Signed)
Date:  02/17/2023 Time:  9:54 AM  Group Topic/Focus:  Goals Group:   The focus of this group is to help patients establish daily goals to achieve during treatment and discuss how the patient can incorporate goal setting into their daily lives to aide in recovery.    Participation Level:  Active  Participation Quality:  Appropriate  Affect:  Appropriate  Cognitive:  Alert  Insight: Appropriate  Engagement in Group:  Engaged  Modes of Intervention:  Discussion  Additional Comments:    Beckie Busing 02/17/2023, 9:54 AM

## 2023-02-17 NOTE — Progress Notes (Signed)
Discharge Note  Pt left ambulatory to lobby. Pt presented with calm affect and mood.Denies SI,HI and AVH.  Discharge instructions reviewed with pt and pt verbalized understanding with no questions or concerns voiced. Vital signs stable. Pt was accompanied by Mother and left in private auto.

## 2023-02-17 NOTE — [facility] Suicide Risk Assessment (Signed)
[facility] Discharge Suicide Risk Assessment   Principal Problem: Schizoaffective disorder [facility]) Discharge Diagnoses: Active Problems:   Schizoaffective disorder, depressive type (HCC)   Total Time spent with patient: 20 minutes  Billy Rose is a 22 yr old male who presented on 9/13 to [facility] with complaints of delusions and insomnia, he was admitted to [facility] on 9/15. PPHx is significant for Schizoaffective Disorder, Depression, PTSD, and a history of Polysubstance Abuse and Substance induced Psychosis, and 3 Suicide Attempts (OD- Prozac 2023), Self Injurious Behavior (Cutting- last 2023), and Multiple Psychiatric Hospitalizations (last- [facility] 09/2022).   During the patient's hospitalization, patient had extensive initial psychiatric evaluation, and follow-up psychiatric evaluations every day.   Psychiatric diagnoses provided upon initial assessment:  -Schizoaffective disorder, depressive type    Patient's psychiatric medications were adjusted on admission:  -Increase Risperdal M-tabs to 1 mg BID for psychosis and mood stability.    During the hospitalization, other adjustments were made to the patient's psychiatric medication regimen:  -risperdal was consolidated and changed from 1 mg bi dto 2 mg at bedtime at discharge after speaking with the pt and the mother    Patient's care was discussed during the interdisciplinary team meeting every day during the hospitalization.   The patient denied having any having side effects to prescribed psychiatric medication.   Gradually, patient started adjusting to milieu. The patient was evaluated each day by a clinical provider to ascertain response to treatment. Improvement was noted by the patient's report of decreasing symptoms, improved sleep and appetite, affect, medication tolerance, behavior, and participation in unit programming.  Patient was asked each day to complete a self inventory noting mood, mental status, pain, new symptoms, anxiety and  concerns.     Symptoms were reported as significantly decreased or resolved completely by discharge.    On day of discharge, the patient reports that their mood is stable. The patient denied having suicidal thoughts for more than 48 hours prior to discharge.  Patient denies having homicidal thoughts.  Patient denies having auditory hallucinations.  Patient denies any visual hallucinations or other symptoms of psychosis. The patient was motivated to continue taking medication with a goal of continued improvement in mental health.    The patient reports their target psychiatric symptoms of psychosis and anxiety, all responded well to the psychiatric medications, and the patient reports overall benefit other psychiatric hospitalization. Supportive psychotherapy was provided to the patient. The patient also participated in regular group therapy while hospitalized. Coping skills, problem solving as well as relaxation therapies were also part of the unit programming.   Labs were reviewed with the patient, and abnormal results were discussed with the patient.   The patient is able to verbalize their individual safety plan to this provider.   # It is recommended to the patient to continue psychiatric medications as prescribed, after discharge from the hospital.     # It is recommended to the patient to follow up with your outpatient psychiatric provider and PCP.   # It was discussed with the patient, the impact of alcohol, drugs, tobacco have been there overall psychiatric and medical wellbeing, and total abstinence from substance use was recommended the patient.ed.   # Prescriptions provided or sent directly to preferred pharmacy at discharge. Patient agreeable to plan. Given opportunity to ask questions. Appears to feel comfortable with discharge.    # In the event of worsening symptoms, the patient is instructed to call the crisis hotline, 911 and or go to the nearest ED  for appropriate evaluation and  treatment of symptoms. To follow-up with primary care provider for other medical issues, concerns and or health care needs   # Patient was discharged to care of mother, with a plan to follow up as noted below.       Psychiatric Specialty Exam  Presentation  General Appearance:  Appropriate for Environment; Casual; Fairly Groomed  Eye Contact: Good  Speech: Normal Rate; Clear and Coherent  Speech Volume: Normal  Handedness: Right   Mood and Affect  Mood: Euthymic  Duration of Depression Symptoms: No data recorded Affect: Appropriate; Congruent; Full Range   Thought Process  Thought Processes: Linear  Descriptions of Associations:Intact  Orientation:Full (Time, Place and Person)  Thought Content:Logical  History of Schizophrenia/Schizoaffective disorder:Yes  Duration of Psychotic Symptoms:Greater than six months  Hallucinations:Hallucinations: None  Ideas of Reference:None  Suicidal Thoughts:Suicidal Thoughts: No  Homicidal Thoughts:Homicidal Thoughts: No   Sensorium  Memory: Immediate Good; Recent Good; Remote Good  Judgment: Impaired  Insight: Lacking   Executive Functions  Concentration: Fair  Attention Span: Fair  Recall: Good  Fund of Knowledge: Good  Language: Good   Psychomotor Activity  Psychomotor Activity: Psychomotor Activity: Normal   Assets  Assets: Communication Skills; Housing; Resilience; Social Support; Financial Resources/Insurance   Sleep  Sleep: Sleep: Fair Number of Hours of Sleep: 8.75   Physical Exam: Physical Exam See discharge summary  ROS See discharge summary   Blood pressure 107/80, pulse 88, temperature 97.7 F (36.5 C), temperature source Oral, resp. rate 16, height 5\' 8"  (1.727 m), weight 56.1 kg, SpO2 98%. Body mass index is 18.79 kg/m.  Mental Status Per Nursing Assessment::   On Admission:  NA  Demographic Factors:  Male, Adolescent or young adult, and Caucasian  Loss  Factors: NA  Historical Factors: NA  Risk Reduction Factors:   Living with another person, especially a relative, Positive social support, Positive therapeutic relationship, and Positive coping skills or problem solving skills  Continued Clinical Symptoms:  Mood is stable. Denying having psychotic symptoms. Denies SI, HI.   Cognitive Features That Contribute To Risk:  None    Suicide Risk:  Mild:  There are no identifiable suicide plans, no associated intent, mild dysphoria and related symptoms, good self-control (both objective and subjective assessment), few other risk factors, and identifiable protective factors, including available and accessible social support.    Follow-up Information     [facility]. Call on 02/24/2023.   Why: A referral has been made to this provider for therapy and medication management services.  You have an appointment on 02/24/23 at 1:45 pm. Contact information: [address]  [address] [place] [address] [phone]                 Plan Of Care/Follow-up recommendations:   -Follow-up with your outpatient psychiatric provider -instructions on appointment date, time, and address (location) are provided to you in discharge paperwork.   -Take your psychiatric medications as prescribed at discharge - instructions are provided to you in the discharge paperwork   -Follow-up with outpatient primary care doctor and other specialists -for management of preventative medicine and chronic medical disease   -If you are prescribed an atypical antipsychotic medication, we recommend that your outpatient psychiatrist follow routine screening for side effects within 3 months of discharge, including monitoring: AIMS scale, height, weight, blood pressure, fasting lipid panel, HbA1c, and fasting blood sugar.    -Recommend total abstinence from alcohol, tobacco, and other illicit drug use at discharge.    -  If your psychiatric symptoms recur, worsen, or if  you have side effects to your psychiatric medications, call your outpatient psychiatric provider, 911, 988 or go to the nearest emergency department.   -If suicidal thoughts occur, immediately call your outpatient psychiatric provider, 911, 988 or go to the nearest emergency department.   Cristy Hilts, MD 02/17/2023, 10:30 AM

## 2023-02-17 NOTE — Progress Notes (Signed)
  [facility] Adult Case Management Discharge Plan :  Will you be returning to the same living situation after discharge:  Yes,  Pt will return back to his apartment At discharge, do you have transportation home?: Yes,  Pt mom will be picking him up around 4pm or sooner Do you have the ability to pay for your medications: Yes,  [employer] Plan   Release of information consent forms completed and in the chart;  Patient's signature needed at discharge.  Patient to Follow up at:  Follow-up Information     [facility]. Call on 02/24/2023.   Why: A referral has been made to this provider for therapy and medication management services.  You have an appointment on 02/24/23 at 1:45 pm. Contact information: [address]  [address] [place] [address] [phone]                 Next level of care provider has access to [facility] Link:no  Safety Planning and Suicide Prevention discussed: Yes,  Mother Mauri Ledon [phone]     Has patient been referred to the Quitline?: Patient refused referral for treatment  Patient has been referred for addiction treatment: No known substance use disorder.  Isabella Bowens, LCSWA 02/17/2023, 11:42 AM

## 2023-02-17 NOTE — [facility] Suicide Risk Assessment (Signed)
[facility] INPATIENT:  Family/Significant Other Suicide Prevention Education  Suicide Prevention Education:  Education Completed; mother Keyandre Moyo [phone],  (name of family member/significant other) has been identified by the patient as the family member/significant other with whom the patient will be residing, and identified as the person(s) who will aid the patient in the event of a mental health crisis (suicidal ideations/suicide attempt).  With written consent from the patient, the family member/significant other has been provided the following suicide prevention education, prior to the and/or following the discharge of the patient.   CSW spoke with mom and mom shared that she did not have any safety concerns and that she can pick patient up today once she get off around 4:00pm. Mom stated that patient has an appt tomorrow to get established with a new psychiatrist, but does not know if it will be an immediate appointment and still asked if we could get [facility] temporarily since that is who he last seen. Mom also confirmed that pt does not have access to any guns or weapons where he lives and that she did not have roommate contact information to give to CSW, to ask patient.   The suicide prevention education provided includes the following: Suicide risk factors Suicide prevention and interventions National Suicide Hotline telephone number [facility] assessment telephone number [facility] Emergency Assistance 911 [facility] and/or Residential Mobile Crisis Unit telephone number  Request made of family/significant other to: Remove weapons (e.g., guns, rifles, knives), all items previously/currently identified as safety concern.   Remove drugs/medications (over-the-counter, prescriptions, illicit drugs), all items previously/currently identified as a safety concern.  The family member/significant other verbalizes understanding of the suicide prevention education information  provided.  The family member/significant other agrees to remove the items of safety concern listed above.  Isabella Bowens 02/17/2023, 9:20 AM

## 2023-05-26 ENCOUNTER — Other Ambulatory Visit: Payer: Self-pay

## 2023-05-26 ENCOUNTER — Emergency Department (HOSPITAL_COMMUNITY)
Admission: EM | Admit: 2023-05-26 | Discharge: 2023-05-27 | Disposition: A | Discharge status: Other Acute Inpatient Hospital | Payer: MEDICAID | Attending: Emergency Medicine | Admitting: Emergency Medicine

## 2023-05-26 ENCOUNTER — Encounter (HOSPITAL_COMMUNITY): Payer: Self-pay | Admitting: Emergency Medicine

## 2023-05-26 DIAGNOSIS — F259 Schizoaffective disorder, unspecified: Secondary | ICD-10-CM | POA: Insufficient documentation

## 2023-05-26 DIAGNOSIS — Z046 Encounter for general psychiatric examination, requested by authority: Secondary | ICD-10-CM

## 2023-05-26 HISTORY — DX: Suicide attempt, initial encounter: T14.91XA

## 2023-05-26 HISTORY — DX: Anxiety disorder, unspecified: F41.9

## 2023-05-26 HISTORY — DX: Schizoaffective disorder, bipolar type: F25.0

## 2023-05-26 LAB — COMPREHENSIVE METABOLIC PANEL
ALT: 23 U/L (ref 0–44)
AST: 28 U/L (ref 15–41)
Albumin: 4.9 g/dL (ref 3.5–5.0)
Alkaline Phosphatase: 65 U/L (ref 38–126)
Anion gap: 9 (ref 5–15)
BUN: 11 mg/dL (ref 6–20)
CO2: 25 mmol/L (ref 22–32)
Calcium: 10 mg/dL (ref 8.9–10.3)
Chloride: 107 mmol/L (ref 98–111)
Creatinine, Ser: 1.22 mg/dL (ref 0.61–1.24)
GFR, Estimated: >60 mL/min (ref >60)
Glucose, Bld: 92 mg/dL (ref 70–99)
Potassium: 5.2 mmol/L — ABNORMAL HIGH (ref 3.5–5.1)
Sodium: 141 mmol/L (ref 135–145)
Total Bilirubin: 1.2 mg/dL — ABNORMAL HIGH (ref <1.2)
Total Protein: 7.4 g/dL (ref 6.5–8.1)

## 2023-05-26 LAB — RAPID URINE DRUG SCREEN, HOSP PERFORMED
Amphetamines: NOT DETECTED
Barbiturates: NOT DETECTED
Benzodiazepines: NOT DETECTED
Cocaine: NOT DETECTED
Opiates: NOT DETECTED
Tetrahydrocannabinol: NOT DETECTED

## 2023-05-26 LAB — ETHANOL: Alcohol, Ethyl (B): <10 mg/dL (ref <10)

## 2023-05-26 LAB — CBC WITH DIFFERENTIAL/PLATELET
Abs Immature Granulocytes: 0.05 10*3/uL (ref 0.00–0.07)
Basophils Absolute: 0.1 10*3/uL (ref 0.0–0.1)
Basophils Relative: 0 %
Eosinophils Absolute: 0.1 10*3/uL (ref 0.0–0.5)
Eosinophils Relative: 1 %
HCT: 46 % (ref 39.0–52.0)
Hemoglobin: 15.8 g/dL (ref 13.0–17.0)
Immature Granulocytes: 0 %
Lymphocytes Relative: 10 %
Lymphs Abs: 1.5 10*3/uL (ref 0.7–4.0)
MCH: 30.8 pg (ref 26.0–34.0)
MCHC: 34.3 g/dL (ref 30.0–36.0)
MCV: 89.7 fL (ref 80.0–100.0)
Monocytes Absolute: 0.9 10*3/uL (ref 0.1–1.0)
Monocytes Relative: 6 %
Neutro Abs: 12.3 10*3/uL — ABNORMAL HIGH (ref 1.7–7.7)
Neutrophils Relative %: 83 %
Platelets: 250 10*3/uL (ref 150–400)
RBC: 5.13 MIL/uL (ref 4.22–5.81)
RDW: 11.9 % (ref 11.5–15.5)
WBC: 14.9 10*3/uL — ABNORMAL HIGH (ref 4.0–10.5)
nRBC: 0 % (ref 0.0–0.2)
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
## 2023-05-26 NOTE — ED Notes (Signed)
Pt walked up to the bridge asking to see the doctor. States he has not seen anyone and that he is supposed to be leaving. PA aware

## 2023-05-26 NOTE — ED Notes (Signed)
Patient denies pain and is resting comfortably.  

## 2023-05-26 NOTE — ED Notes (Signed)
Pt continues to ask for someone to come talk to him. He is under the impression he is going home.

## 2023-05-26 NOTE — ED Notes (Signed)
Pt is now screaming through the halls asking for Dr. Lynelle Doctor
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
## 2023-05-26 NOTE — ED Provider Notes (Signed)
[place] EMERGENCY DEPARTMENT AT [facility] Provider Note   CSN: [id number] Arrival date & time: 05/26/23  1502     History  Chief Complaint  Patient presents with   Involuntary Commitment    Billy Rose is a 22 y.o. male with a history of schizoaffective disorder, polysubstance dependence, and hallucinations who presents to the ED today escorted by the police under IVC.  Per [employer], they received multiple complaints about patient's as he was walking up and down the sidewalk being aggressive, yelling, and cussing at the same street corner for about 4 hours.  Patient's mom came mom states and try to take patient home but he jumped out of the car.  She ultimately completed IVC paperwork as he a danger to himself.  Patient is calm at time of evaluation.  He denies SI, HI, drug or alcohol use.  No visual or auditory hallucinations.  Takes Latuda daily for schizophrenia.  No additional complaints or concerns at this time.    Home Medications Prior to Admission medications   Medication Sig Start Date End Date Taking? Authorizing Provider  lurasidone (LATUDA) 80 MG TABS tablet Take 80 mg by mouth daily. 04/16/23  Yes [provider]      Allergies    Penicillins, Sulfa antibiotics, and Abilify [aripiprazole]    Review of Systems   Review of Systems  Psychiatric/Behavioral:  Positive for agitation.   All other systems reviewed and are negative.   Physical Exam Updated Vital Signs BP 119/74 (BP Location: Right Arm)   Pulse 65   Temp 98.4 F (36.9 C) (Oral)   Resp 19   Ht 5\' 8"  (1.727 m)   Wt 61.2 kg   SpO2 98%   BMI 20.53 kg/m  Physical Exam Vitals and nursing note reviewed.  Constitutional:      General: He is not in acute distress.    Appearance: Normal appearance.  HENT:     Head: Normocephalic and atraumatic.     Mouth/Throat:     Mouth: Mucous membranes are moist.  Eyes:     Conjunctiva/sclera: Conjunctivae normal.     Pupils: Pupils are  equal, round, and reactive to light.  Cardiovascular:     Rate and Rhythm: Normal rate and regular rhythm.     Pulses: Normal pulses.     Heart sounds: Normal heart sounds.  Pulmonary:     Effort: Pulmonary effort is normal.     Breath sounds: Normal breath sounds.  Abdominal:     Palpations: Abdomen is soft.     Tenderness: There is no abdominal tenderness.  Musculoskeletal:        General: Normal range of motion.     Cervical back: Normal range of motion.  Skin:    General: Skin is warm and dry.     Findings: No rash.  Neurological:     General: No focal deficit present.     Mental Status: He is alert.  Psychiatric:        Mood and Affect: Mood normal.        Behavior: Behavior normal.    ED Results / Procedures / Treatments   Labs (all labs ordered are listed, but only abnormal results are displayed) Labs Reviewed  COMPREHENSIVE METABOLIC PANEL - Abnormal; Notable for the following components:      Result Value   Potassium 5.2 (*)    Total Bilirubin 1.2 (*)    All other components within normal limits  CBC WITH DIFFERENTIAL/PLATELET -  Abnormal; Notable for the following components:   WBC 14.9 (*)    Neutro Abs 12.3 (*)    All other components within normal limits  ETHANOL  RAPID URINE DRUG SCREEN, HOSP PERFORMED    EKG EKG Interpretation Date/Time:  Tuesday May 26 2023 15:21:53 EST Ventricular Rate:  105 PR Interval:  144 QRS Duration:  74 QT Interval:  322 QTC Calculation: 425 R Axis:   75  Text Interpretation: Sinus tachycardia Right atrial enlargement Borderline ECG When compared with ECG of 13-Feb-2023 17:30, PREVIOUS ECG IS PRESENT Since last tracing rate faster Confirmed by Linwood Dibbles [phone]) on 05/26/2023 5:43:22 PM  Radiology No results found.  Procedures Procedures: not indicated.   Medications Ordered in ED Medications - No data to display  ED Course/ Medical Decision Making/ A&P                                 Medical Decision  Making Amount and/or Complexity of Data Reviewed Labs: ordered.   This patient presents to the ED for concern of IVC, this involves an extensive number of treatment options, and is a complaint that carries with it a high risk of complications and morbidity.   Differential diagnosis includes: Substance intoxication, psychosis, SI, HI, etc.   Comorbidities  See HPI above   Additional History  Additional history obtained from prior records.   Cardiac Monitoring / EKG  The patient was maintained on a cardiac monitor.  I personally viewed and interpreted the cardiac monitored which showed: sinus tachycardia with right atrial enlargement, heart rate of 105 bpm.   Lab Tests  I ordered and personally interpreted labs.  The pertinent results include:   CMP and CBC within normal limits Ethanol <10 UDS is negative   Consultations  I requested consultation with TTS,  and discussed lab and imaging findings as well as pertinent plan - they recommend: patient evaluated by Sindy Guadeloupe, NP. Inpatient treatment recommended. CSW will seek placement.   Problem List / ED Course / Critical Interventions / Medication Management  IVC hold per patient's mother I have reviewed the patients home medicines and have made adjustments as needed   Social Determinants of Health  Mental state   Test / Admission - Considered  Inpatient treatment recommended per Sindy Guadeloupe, NP. CSW will find placement for patient.       Final Clinical Impression(s) / ED Diagnoses Final diagnoses:  Involuntary commitment    Rx / DC Orders ED Discharge Orders     None         Maxwell Marion, PA-C 05/26/23 2246    Linwood Dibbles, MD 05/27/23 [phone]

## 2023-05-26 NOTE — ED Notes (Signed)
Patient refuses to change out of clothes into gown at this time but otherwise cooperative.

## 2023-05-26 NOTE — BH Assessment (Signed)
Clinician messaged Maggie Schwalbe., RN: "Hey. It's Trey with TTS. Is the pt able to engage in the assessment, if so the pt will need to be placed in a private room. Is the pt medically cleared?"   Clinician awaiting response.    Redmond Pulling, MS, [facility], [facility] Triage Specialist [phone]

## 2023-05-26 NOTE — ED Notes (Signed)
Pt personal belongings inventoried and given to security.

## 2023-05-26 NOTE — ED Notes (Signed)
Patient came in IVC'D with [employer] first exam done by dr. Lynelle Doctor all documents scanned into chart no case number to efile by case and no results found when searched by name so copies have been made 3 attached to clipboard orange zone 1 in medical records an original in red folder

## 2023-05-27 MED ORDER — LORAZEPAM 1 MG PO TABS
1.0000 mg | ORAL_TABLET | ORAL | Status: AC | PRN
  Administered 2023-05-27: 1 mg via ORAL
  Filled 2023-05-27: qty 1

## 2023-05-27 MED ORDER — OLANZAPINE 5 MG PO TBDP: 5.0000 mg | ORAL_TABLET | Freq: Three times a day (TID) | ORAL | Status: DC | PRN

## 2023-05-27 MED ORDER — LURASIDONE HCL 40 MG PO TABS
80.0000 mg | ORAL_TABLET | Freq: Every day | ORAL | Status: DC
  Administered 2023-05-27: 80 mg via ORAL
  Filled 2023-05-27: qty 2

## 2023-05-27 MED ORDER — HYDROXYZINE HCL 25 MG PO TABS
25.0000 mg | ORAL_TABLET | Freq: Once | ORAL | Status: AC
  Administered 2023-05-27: 25 mg via ORAL
  Filled 2023-05-27: qty 1

## 2023-05-27 MED ORDER — ZIPRASIDONE MESYLATE 20 MG IM SOLR: 20.0000 mg | INTRAMUSCULAR | Status: DC | PRN

## 2023-05-27 NOTE — ED Notes (Addendum)
Cat L Intake Specialist, from [facility] called at 11:10 concerning patient.

## 2023-05-27 NOTE — ED Provider Notes (Signed)
Emergency Medicine Observation Re-evaluation Note  Billy Rose is a 22 y.o. male, seen on rounds today.  Pt initially presented to the ED for complaints of Involuntary Commitment Currently, the patient is sitting in bed.  Physical Exam  BP 108/72 (BP Location: Right Arm)   Pulse (!) 59   Temp 98.3 F (36.8 C) (Oral)   Resp 18   Ht 5\' 8"  (1.727 m)   Wt 61.2 kg   SpO2 97%   BMI 20.53 kg/m  Physical Exam General: Awake, alert, nondistressed Cardiac: Extremities well-perfused Lungs: Breathing is unlabored Psych: Currently denying SI, HI, or AVH.  ED Course / MDM  EKG:EKG Interpretation Date/Time:  Tuesday May 26 2023 15:21:53 EST Ventricular Rate:  105 PR Interval:  144 QRS Duration:  74 QT Interval:  322 QTC Calculation: 425 R Axis:   75  Text Interpretation: Sinus tachycardia Right atrial enlargement Borderline ECG When compared with ECG of 13-Feb-2023 17:30, PREVIOUS ECG IS PRESENT Since last tracing rate faster Confirmed by Linwood Dibbles [phone]) on 05/26/2023 5:43:22 PM  I have reviewed the labs performed to date as well as medications administered while in observation.  Recent changes in the last 24 hours include presentation to the emergency department yesterday for aggressive and bizarre behavior.  He is currently under IVC.  TTS evaluated and recommends inpatient psychiatric admission.  Patient was accepted to [facility].  Plan for transfer today.  Plan  Current plan is for inpatient psychiatric admission.    Gloris Manchester, MD 05/27/23 1125

## 2023-05-27 NOTE — Progress Notes (Signed)
Pt has been accepted to [facility] on 05/27/2023 Bed assignment: [address]   Pt meets inpatient criteria per: Alona Bene, PMHNP   Attending Physician will be: Sherrian Divers MD  Report can be called to: [phone]  Pt can arrive anytime today.   Care Team Notified: Alona Bene, PMHNP, [facility] RN.  [place] Ione [place] LCSW-A   05/27/2023 11:14 AM

## 2023-05-27 NOTE — Progress Notes (Signed)
LCSW Progress Note  [id number]   Billy Rose  05/27/2023  10:11 AM  Description:   Inpatient Psychiatric Referral  Patient was recommended inpatient per [facility], PMHNP  There are no available beds at [facility], per [facility] RN Patient was referred to the following out of network facilities:    [facility] Provider Address Phone Fax  [facility] [address] [place] [address] [phone]  [facility] [address] [place] [address] [phone]  [facility] [address], [place] [address] [phone]  [facility] [address] [place], [place] [address] [phone]  [facility] [address] [place] [address] [phone]  [facility] [address] [place] [address] [phone]  [facility] [address], [place] [address] [phone]  [facility] [address] [place] [address] [phone]  [facility] [address], [place] [address] [phone]  [facility] EFAX [address], [place] [phone]  [facility] [address] [place] [address] [phone]  [facility] [address] [place] [address] [phone]  [facility] [address], [place] [address] [phone]  [facility] [address] [place] [address] [phone]  [facility] [place] [phone]  [facility] Patient Placement [facility], [place] [phone]  [phone]  [facility] [address], [place] [address] [phone]  [facility] [place] [phone]      Situation ongoing, CSW to continue following and update chart as more information becomes available.      [name], MSW, LCSW  05/27/2023 10:11 AM

## 2023-05-27 NOTE — ED Notes (Signed)
Report was given at 12:23  to [place], RN from  [facility].

## 2023-05-27 NOTE — ED Notes (Signed)
Patient was picked up by the sheriff at 1645. Rn try to call [facility] the sheriff was on the was, but not able to get intouch with them.
